# Patient Record
Sex: Female | Born: 1940 | Race: White | Hispanic: No | Marital: Single | Smoking: Never smoker
Health system: Southern US, Community
[De-identification: ages and names within clinical notes are randomized; demographics above are authoritative.]

## PROBLEM LIST (undated history)

## (undated) DIAGNOSIS — N951 Menopausal and female climacteric states: Secondary | ICD-10-CM

## (undated) DIAGNOSIS — I517 Cardiomegaly: Secondary | ICD-10-CM

## (undated) DIAGNOSIS — T7840XA Allergy, unspecified, initial encounter: Secondary | ICD-10-CM

## (undated) DIAGNOSIS — M1710 Unilateral primary osteoarthritis, unspecified knee: Secondary | ICD-10-CM

## (undated) DIAGNOSIS — I472 Ventricular tachycardia, unspecified: Secondary | ICD-10-CM

## (undated) DIAGNOSIS — I429 Cardiomyopathy, unspecified: Secondary | ICD-10-CM

## (undated) DIAGNOSIS — I2510 Atherosclerotic heart disease of native coronary artery without angina pectoris: Secondary | ICD-10-CM

## (undated) DIAGNOSIS — E669 Obesity, unspecified: Secondary | ICD-10-CM

## (undated) DIAGNOSIS — R0600 Dyspnea, unspecified: Secondary | ICD-10-CM

## (undated) DIAGNOSIS — I10 Essential (primary) hypertension: Secondary | ICD-10-CM

## (undated) DIAGNOSIS — I4729 Other ventricular tachycardia: Secondary | ICD-10-CM

## (undated) DIAGNOSIS — R7303 Prediabetes: Secondary | ICD-10-CM

## (undated) DIAGNOSIS — E079 Disorder of thyroid, unspecified: Secondary | ICD-10-CM

## (undated) DIAGNOSIS — M1990 Unspecified osteoarthritis, unspecified site: Secondary | ICD-10-CM

## (undated) HISTORY — DX: Atherosclerotic heart disease of native coronary artery without angina pectoris: I25.10

## (undated) HISTORY — DX: Cardiomegaly: I51.7

## (undated) HISTORY — DX: Allergy, unspecified, initial encounter: T78.40XA

## (undated) HISTORY — DX: Obesity, unspecified: E66.9

## (undated) HISTORY — DX: Unspecified osteoarthritis, unspecified site: M19.90

## (undated) HISTORY — DX: Other ventricular tachycardia: I47.29

## (undated) HISTORY — PX: OTHER SURGICAL HISTORY: SHX169

## (undated) HISTORY — DX: Unilateral primary osteoarthritis, unspecified knee: M17.10

## (undated) HISTORY — DX: Ventricular tachycardia, unspecified: I47.20

## (undated) HISTORY — DX: Disorder of thyroid, unspecified: E07.9

## (undated) HISTORY — DX: Cardiomyopathy, unspecified: I42.9

## (undated) HISTORY — DX: Essential (primary) hypertension: I10

## (undated) HISTORY — DX: Menopausal and female climacteric states: N95.1

## (undated) HISTORY — DX: Ventricular tachycardia: I47.2

---

## 1998-06-15 ENCOUNTER — Encounter: Admission: RE | Admit: 1998-06-15 | Discharge: 1998-06-15 | Payer: Self-pay | Admitting: Family Medicine

## 1999-06-11 ENCOUNTER — Encounter: Admission: RE | Admit: 1999-06-11 | Discharge: 1999-06-11 | Payer: Self-pay | Admitting: Sports Medicine

## 1999-07-01 ENCOUNTER — Encounter: Admission: RE | Admit: 1999-07-01 | Discharge: 1999-07-01 | Payer: Self-pay | Admitting: Family Medicine

## 1999-07-01 ENCOUNTER — Ambulatory Visit (HOSPITAL_COMMUNITY): Admission: RE | Admit: 1999-07-01 | Discharge: 1999-07-01 | Payer: Self-pay | Admitting: Sports Medicine

## 1999-10-29 ENCOUNTER — Encounter: Admission: RE | Admit: 1999-10-29 | Discharge: 1999-10-29 | Payer: Self-pay | Admitting: Sports Medicine

## 1999-10-29 ENCOUNTER — Encounter: Payer: Self-pay | Admitting: Sports Medicine

## 2000-04-10 ENCOUNTER — Encounter: Admission: RE | Admit: 2000-04-10 | Discharge: 2000-04-10 | Payer: Self-pay | Admitting: Family Medicine

## 2000-04-12 ENCOUNTER — Encounter: Admission: RE | Admit: 2000-04-12 | Discharge: 2000-04-12 | Payer: Self-pay | Admitting: Family Medicine

## 2000-04-19 ENCOUNTER — Encounter: Admission: RE | Admit: 2000-04-19 | Discharge: 2000-04-19 | Payer: Self-pay | Admitting: Family Medicine

## 2000-04-21 ENCOUNTER — Encounter: Admission: RE | Admit: 2000-04-21 | Discharge: 2000-04-21 | Payer: Self-pay | Admitting: Family Medicine

## 2000-05-19 ENCOUNTER — Encounter: Admission: RE | Admit: 2000-05-19 | Discharge: 2000-05-19 | Payer: Self-pay | Admitting: Family Medicine

## 2000-06-22 ENCOUNTER — Encounter: Admission: RE | Admit: 2000-06-22 | Discharge: 2000-06-22 | Payer: Self-pay | Admitting: Family Medicine

## 2000-12-29 ENCOUNTER — Encounter: Payer: Self-pay | Admitting: Sports Medicine

## 2000-12-29 ENCOUNTER — Encounter: Admission: RE | Admit: 2000-12-29 | Discharge: 2000-12-29 | Payer: Self-pay | Admitting: Sports Medicine

## 2001-02-19 ENCOUNTER — Encounter: Admission: RE | Admit: 2001-02-19 | Discharge: 2001-02-19 | Payer: Self-pay | Admitting: Family Medicine

## 2001-06-21 ENCOUNTER — Encounter: Admission: RE | Admit: 2001-06-21 | Discharge: 2001-06-21 | Payer: Self-pay | Admitting: Sports Medicine

## 2001-07-27 ENCOUNTER — Encounter: Admission: RE | Admit: 2001-07-27 | Discharge: 2001-07-27 | Payer: Self-pay | Admitting: Family Medicine

## 2001-08-03 ENCOUNTER — Encounter: Admission: RE | Admit: 2001-08-03 | Discharge: 2001-08-03 | Payer: Self-pay | Admitting: Family Medicine

## 2001-08-31 ENCOUNTER — Ambulatory Visit (HOSPITAL_COMMUNITY): Admission: RE | Admit: 2001-08-31 | Discharge: 2001-08-31 | Payer: Self-pay | Admitting: Sports Medicine

## 2002-04-05 ENCOUNTER — Encounter: Admission: RE | Admit: 2002-04-05 | Discharge: 2002-04-05 | Payer: Self-pay | Admitting: Family Medicine

## 2002-05-23 ENCOUNTER — Encounter: Admission: RE | Admit: 2002-05-23 | Discharge: 2002-05-23 | Payer: Self-pay | Admitting: Sports Medicine

## 2002-05-28 ENCOUNTER — Encounter: Admission: RE | Admit: 2002-05-28 | Discharge: 2002-05-28 | Payer: Self-pay | Admitting: Family Medicine

## 2002-06-24 ENCOUNTER — Encounter: Admission: RE | Admit: 2002-06-24 | Discharge: 2002-06-24 | Payer: Self-pay | Admitting: Family Medicine

## 2002-07-30 ENCOUNTER — Encounter: Admission: RE | Admit: 2002-07-30 | Discharge: 2002-07-30 | Payer: Self-pay | Admitting: Family Medicine

## 2002-10-18 ENCOUNTER — Encounter: Admission: RE | Admit: 2002-10-18 | Discharge: 2002-10-18 | Payer: Self-pay | Admitting: Family Medicine

## 2003-03-13 ENCOUNTER — Encounter: Admission: RE | Admit: 2003-03-13 | Discharge: 2003-03-13 | Payer: Self-pay | Admitting: Family Medicine

## 2003-03-20 ENCOUNTER — Encounter: Admission: RE | Admit: 2003-03-20 | Discharge: 2003-03-20 | Payer: Self-pay | Admitting: Sports Medicine

## 2003-06-26 ENCOUNTER — Encounter: Payer: Self-pay | Admitting: Sports Medicine

## 2003-06-26 ENCOUNTER — Encounter: Admission: RE | Admit: 2003-06-26 | Discharge: 2003-06-26 | Payer: Self-pay | Admitting: Sports Medicine

## 2003-08-21 ENCOUNTER — Encounter: Admission: RE | Admit: 2003-08-21 | Discharge: 2003-08-21 | Payer: Self-pay | Admitting: Family Medicine

## 2004-03-18 ENCOUNTER — Encounter: Admission: RE | Admit: 2004-03-18 | Discharge: 2004-03-18 | Payer: Self-pay | Admitting: Sports Medicine

## 2004-03-19 ENCOUNTER — Encounter: Admission: RE | Admit: 2004-03-19 | Discharge: 2004-03-19 | Payer: Self-pay | Admitting: Sports Medicine

## 2004-03-26 ENCOUNTER — Encounter: Admission: RE | Admit: 2004-03-26 | Discharge: 2004-03-26 | Payer: Self-pay | Admitting: Sports Medicine

## 2004-06-24 ENCOUNTER — Ambulatory Visit: Payer: Self-pay | Admitting: Sports Medicine

## 2004-08-13 ENCOUNTER — Ambulatory Visit: Payer: Self-pay | Admitting: Family Medicine

## 2004-10-14 ENCOUNTER — Ambulatory Visit: Payer: Self-pay | Admitting: Family Medicine

## 2005-01-21 ENCOUNTER — Ambulatory Visit: Payer: Self-pay | Admitting: Family Medicine

## 2005-06-02 ENCOUNTER — Encounter: Payer: Self-pay | Admitting: Sports Medicine

## 2005-06-02 ENCOUNTER — Ambulatory Visit: Payer: Self-pay | Admitting: Sports Medicine

## 2005-06-02 ENCOUNTER — Encounter: Admission: RE | Admit: 2005-06-02 | Discharge: 2005-06-02 | Payer: Self-pay | Admitting: Sports Medicine

## 2005-06-15 ENCOUNTER — Encounter (INDEPENDENT_AMBULATORY_CARE_PROVIDER_SITE_OTHER): Payer: Self-pay | Admitting: *Deleted

## 2005-09-12 ENCOUNTER — Ambulatory Visit: Payer: Self-pay | Admitting: Oncology

## 2005-09-30 ENCOUNTER — Ambulatory Visit (HOSPITAL_COMMUNITY): Admission: RE | Admit: 2005-09-30 | Discharge: 2005-09-30 | Payer: Self-pay | Admitting: Oncology

## 2005-10-07 ENCOUNTER — Ambulatory Visit (HOSPITAL_COMMUNITY): Admission: RE | Admit: 2005-10-07 | Discharge: 2005-10-07 | Payer: Self-pay | Admitting: Gastroenterology

## 2006-01-27 ENCOUNTER — Ambulatory Visit: Payer: Self-pay | Admitting: Oncology

## 2006-03-03 ENCOUNTER — Ambulatory Visit: Payer: Self-pay | Admitting: Oncology

## 2006-03-03 LAB — COMPREHENSIVE METABOLIC PANEL
Albumin: 4.4 g/dL (ref 3.5–5.2)
BUN: 19 mg/dL (ref 6–23)
CO2: 26 mEq/L (ref 19–32)
Chloride: 101 mEq/L (ref 96–112)
Potassium: 4.3 mEq/L (ref 3.5–5.3)

## 2006-03-03 LAB — CBC WITH DIFFERENTIAL/PLATELET
BASO%: 0.5 % (ref 0.0–2.0)
Basophils Absolute: 0 10*3/uL (ref 0.0–0.1)
EOS%: 4 % (ref 0.0–7.0)
Eosinophils Absolute: 0.2 10*3/uL (ref 0.0–0.5)
MCHC: 34.4 g/dL (ref 32.0–36.0)
MONO#: 0.5 10*3/uL (ref 0.1–0.9)
NEUT#: 2.7 10*3/uL (ref 1.5–6.5)
NEUT%: 53.8 % (ref 39.6–76.8)
Platelets: 210 10*3/uL (ref 145–400)
WBC: 5 10*3/uL (ref 3.9–10.0)

## 2006-03-10 ENCOUNTER — Ambulatory Visit (HOSPITAL_COMMUNITY): Admission: RE | Admit: 2006-03-10 | Discharge: 2006-03-10 | Payer: Self-pay | Admitting: Oncology

## 2006-03-24 ENCOUNTER — Ambulatory Visit: Payer: Self-pay | Admitting: Oncology

## 2006-03-27 LAB — CBC WITH DIFFERENTIAL (CANCER CENTER ONLY)
BASO#: 0 10*3/uL (ref 0.0–0.2)
HCT: 42.3 % (ref 34.8–46.6)
HGB: 14.1 g/dL (ref 11.6–15.9)
LYMPH#: 1.9 10*3/uL (ref 0.9–3.3)
MCH: 32.1 pg (ref 26.0–34.0)
MONO%: 7.7 % (ref 0.0–13.0)
Platelets: 203 10*3/uL (ref 145–400)
RDW: 11.2 % (ref 10.5–14.6)
WBC: 5.4 10*3/uL (ref 3.9–10.0)

## 2006-03-27 LAB — COMPREHENSIVE METABOLIC PANEL
AST: 31 U/L (ref 0–37)
Albumin: 4.6 g/dL (ref 3.5–5.2)
Glucose, Bld: 74 mg/dL (ref 70–99)
Sodium: 142 mEq/L (ref 135–145)
Total Protein: 7.9 g/dL (ref 6.0–8.3)

## 2006-06-22 ENCOUNTER — Ambulatory Visit: Payer: Self-pay | Admitting: Sports Medicine

## 2006-10-12 DIAGNOSIS — I517 Cardiomegaly: Secondary | ICD-10-CM | POA: Insufficient documentation

## 2006-10-12 DIAGNOSIS — IMO0002 Reserved for concepts with insufficient information to code with codable children: Secondary | ICD-10-CM | POA: Insufficient documentation

## 2006-10-12 DIAGNOSIS — I10 Essential (primary) hypertension: Secondary | ICD-10-CM | POA: Insufficient documentation

## 2006-10-12 DIAGNOSIS — N951 Menopausal and female climacteric states: Secondary | ICD-10-CM | POA: Insufficient documentation

## 2006-10-12 DIAGNOSIS — J309 Allergic rhinitis, unspecified: Secondary | ICD-10-CM | POA: Insufficient documentation

## 2006-10-12 DIAGNOSIS — M1710 Unilateral primary osteoarthritis, unspecified knee: Secondary | ICD-10-CM

## 2006-10-12 DIAGNOSIS — M5382 Other specified dorsopathies, cervical region: Secondary | ICD-10-CM | POA: Insufficient documentation

## 2006-10-12 DIAGNOSIS — E669 Obesity, unspecified: Secondary | ICD-10-CM | POA: Insufficient documentation

## 2006-10-13 ENCOUNTER — Encounter (INDEPENDENT_AMBULATORY_CARE_PROVIDER_SITE_OTHER): Payer: Self-pay | Admitting: *Deleted

## 2006-11-17 ENCOUNTER — Encounter: Payer: Self-pay | Admitting: Sports Medicine

## 2007-06-17 ENCOUNTER — Ambulatory Visit: Payer: Self-pay | Admitting: Cardiology

## 2007-06-17 ENCOUNTER — Inpatient Hospital Stay (HOSPITAL_COMMUNITY): Admission: EM | Admit: 2007-06-17 | Discharge: 2007-06-20 | Payer: Self-pay | Admitting: Emergency Medicine

## 2007-06-18 ENCOUNTER — Encounter: Payer: Self-pay | Admitting: Cardiology

## 2007-06-20 ENCOUNTER — Encounter: Payer: Self-pay | Admitting: Sports Medicine

## 2007-07-17 ENCOUNTER — Telehealth: Payer: Self-pay | Admitting: Sports Medicine

## 2007-07-18 ENCOUNTER — Ambulatory Visit: Payer: Self-pay | Admitting: Internal Medicine

## 2007-07-18 LAB — CONVERTED CEMR LAB
ALT: 33 units/L (ref 0–35)
AST: 28 units/L (ref 0–37)
Alkaline Phosphatase: 74 units/L (ref 39–117)
Bilirubin, Direct: 0.2 mg/dL (ref 0.0–0.3)

## 2007-07-19 ENCOUNTER — Ambulatory Visit: Payer: Self-pay | Admitting: Internal Medicine

## 2007-08-27 ENCOUNTER — Ambulatory Visit: Payer: Self-pay | Admitting: Internal Medicine

## 2007-10-19 ENCOUNTER — Ambulatory Visit: Payer: Self-pay | Admitting: Internal Medicine

## 2007-11-02 ENCOUNTER — Ambulatory Visit: Payer: Self-pay

## 2007-11-02 ENCOUNTER — Encounter: Payer: Self-pay | Admitting: Internal Medicine

## 2008-03-21 ENCOUNTER — Ambulatory Visit: Payer: Self-pay | Admitting: Internal Medicine

## 2008-09-18 ENCOUNTER — Ambulatory Visit: Payer: Self-pay | Admitting: Internal Medicine

## 2008-12-10 ENCOUNTER — Telehealth: Payer: Self-pay | Admitting: Internal Medicine

## 2008-12-22 ENCOUNTER — Telehealth: Payer: Self-pay | Admitting: Internal Medicine

## 2008-12-24 ENCOUNTER — Telehealth (INDEPENDENT_AMBULATORY_CARE_PROVIDER_SITE_OTHER): Payer: Self-pay | Admitting: *Deleted

## 2009-03-06 ENCOUNTER — Telehealth: Payer: Self-pay | Admitting: Internal Medicine

## 2009-08-21 ENCOUNTER — Ambulatory Visit: Payer: Self-pay | Admitting: Internal Medicine

## 2009-08-21 DIAGNOSIS — I472 Ventricular tachycardia, unspecified: Secondary | ICD-10-CM | POA: Insufficient documentation

## 2009-08-27 LAB — CONVERTED CEMR LAB
ALT: 36 units/L — ABNORMAL HIGH (ref 0–35)
Albumin: 4.3 g/dL (ref 3.5–5.2)
CO2: 29 meq/L (ref 19–32)
Calcium: 9.8 mg/dL (ref 8.4–10.5)
Creatinine, Ser: 1.1 mg/dL (ref 0.4–1.2)
GFR calc non Af Amer: 52.66 mL/min (ref >60)
Glucose, Bld: 76 mg/dL (ref 70–99)
Potassium: 3.8 meq/L (ref 3.5–5.1)
Total Bilirubin: 0.8 mg/dL (ref 0.3–1.2)

## 2009-10-21 ENCOUNTER — Telehealth (INDEPENDENT_AMBULATORY_CARE_PROVIDER_SITE_OTHER): Payer: Self-pay | Admitting: *Deleted

## 2010-02-10 ENCOUNTER — Ambulatory Visit: Payer: Self-pay | Admitting: Internal Medicine

## 2010-09-05 ENCOUNTER — Encounter: Payer: Self-pay | Admitting: Sports Medicine

## 2010-09-13 ENCOUNTER — Encounter: Payer: Self-pay | Admitting: Internal Medicine

## 2010-09-13 ENCOUNTER — Ambulatory Visit
Admission: RE | Admit: 2010-09-13 | Discharge: 2010-09-13 | Payer: Self-pay | Source: Home / Self Care | Attending: Internal Medicine | Admitting: Internal Medicine

## 2010-09-16 NOTE — Assessment & Plan Note (Signed)
Summary: rov per pt call/wants to discuss meds/lg   Primary Provider:  Dr. Concha Se urgent care  CC:  rov. pt wants to discuss medications. pt had an EKG at [facility] last week.  Marland Kitchen  History of Present Illness: Carmen Brown returns today for followup.  She is a pleasant 70 yo woman with a h/o repetitive monomophic VT initially diagnosed several yrs ago.  She was placed on amiodarone and has done well.  Her LV function was initially down but is now improved and she is off of her CHF meds.  We have gradually decreased her dosage of amiodarone and she is now taking a gram a week.  She has developed some thyroid dysfunction and was placed on thyroid replacement.  She did not tolerate the synthroid that I prescribed but has tolerated levothyroxine just fine.  She denies c/p or sob.  No syncope.  Current Medications (verified): 1)  Amiodarone Hcl 200 Mg Tabs (Amiodarone Hcl) .... Take 1/2  Tablet By Mouth Once A Day 5 Days A Week 2)  Furosemide 20 Mg Tabs (Furosemide) .... Take 1 Tablet By Mouth Once A Day 3)  Lisinopril 10 Mg Tabs (Lisinopril) .... Take One Tablet By Mouth Daily 4)  Potassium Chloride Cr 10 Meq Cr-Tabs (Potassium Chloride) .... Take 1 Tablet By Mouth Once A Day 5)  Multivitamins   Tabs (Multiple Vitamin) .... Take One Tablet By Mouth Twice Daily. 6)  Glucosamine Sulfate   Powd (Glucosamine Sulfate) .... Take One Tablet By Mouth Twice Daily. 7)  Levothyroxine Sodium 25 Mcg Tabs (Levothyroxine Sodium) .... Take One Tablet Two Times A Day 8)  Vitamin D3 2000 Unit Caps (Cholecalciferol) .... Take One Capsule Daily 9)  Aspirin 81 Mg Tabs (Aspirin) .... 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    Vital Signs:  Patient profile:   70 year old female Height:      66 inches Weight:      180 pounds BMI:     29.16 Pulse rate:   84 / minute Pulse rhythm:   regular BP sitting:   133 / 94  (left arm) Cuff size:   regular  Vitals Entered By: Judithe Modest CMA (February 10, 2010 3:46 PM)  Physical Exam  General:  Well developed, well nourished, in no acute distress.  HEENT: normal Neck: supple. No JVD. Carotids 2+ bilaterally no bruits Cor: RRR no rubs or murmurs.  A soft S4 is present. Lungs: CTA Ab: soft, nontender. nondistended. No HSM. Good bowel sounds Ext: warm. no cyanosis, clubbing or edema Neuro: alert and oriented. Grossly nonfocal. 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  We discussed two treatment options today.  The first was to continue her low dose amiodarone.  The second was to  stop amiodarone and see how she does.  She would like to stop amiodarone.  She may ultimately not require thyroid replacement.  I have asked her to call me and let me know if her symptoms of palpitations return and we would of course restart amiodarone. Her updated medication list for this problem includes:    Amiodarone Hcl 200 Mg Tabs (Amiodarone hcl) .Marland Kitchen... Take 1/2  tablet by mouth once a day 5 days a week    Lisinopril 10 Mg Tabs (Lisinopril) .Marland Kitchen... Take one tablet by mouth daily    Aspirin 81 Mg Tabs (Aspirin) ..... Once daily  Her updated medication list for this problem includes:     Amiodarone Hcl 200 Mg Tabs (Amiodarone hcl) .Marland Kitchen... Take 1/2  tablet by mouth once a day 5 days a week    Lisinopril 10 Mg Tabs (Lisinopril) .Marland Kitchen... Take one tablet by mouth daily    Aspirin 81 Mg Tabs (Aspirin) ..... Once daily  Problem # 2:  HYPERTENSION, BENIGN SYSTEMIC (ICD-401.1)  Her blood pressure has been fairly well controlled.  In addition to her meds below, she is instructed to maintain a low sodium diet. Her updated medication list for this problem includes:    Furosemide 20 Mg Tabs (Furosemide) .Marland Kitchen... Take 1 tablet by mouth once a day    Lisinopril 10 Mg Tabs (Lisinopril) .Marland Kitchen... Take one tablet by mouth daily    Aspirin 81 Mg Tabs (Aspirin) ..... Once daily  Her updated medication list for this problem includes:    Furosemide 20 Mg Tabs (Furosemide) .Marland Kitchen... Take 1 tablet by mouth once a day    Lisinopril 10 Mg Tabs (Lisinopril) .Marland Kitchen... Take one tablet by mouth daily    Aspirin 81 Mg Tabs (Aspirin) ..... Once daily  Patient Instructions: 1)  Your physician recommends that you schedule a follow-up appointment as needed

## 2010-09-16 NOTE — Assessment & Plan Note (Signed)
Summary: PER CHECK OUT/SF   Visit Type:  Follow-up Primary Provider:  Paula Compton MD   History of Present Illness: Carmen Brown returns today for followup.  She is a very pleasant 70 year old woman with a history of repetitive monomorphic VT which has been quiescent over the last year on low-dose amiodarone.  The patient has had a history of heart failure, but this is resolving.  Her LV function is actually normalized.  When I saw her last a year ago, she was having problems with shoulder pain and she went to an acupuncturist and her shoulder feels better.  She has had no symptomatic arrhythmias.  She notes that she has been under increased stress and her blood pressure is up.  No c/p or sob.  Current Medications (verified): 1)  Amiodarone Hcl 200 Mg Tabs (Amiodarone Hcl) .... Take 1/2  Tablet By Mouth Once A Day 2)  Furosemide 20 Mg Tabs (Furosemide) .... Take 1 Tablet By Mouth Once A Day 3)  Lisinopril 10 Mg Tabs (Lisinopril) .... Take One Tablet By Mouth Daily 4)  Potassium Chloride Cr 10 Meq Cr-Tabs (Potassium Chloride) .... Take 1 Tablet By Mouth Once A Day 5)  Flunisolide 29 Mcg/act Soln (Flunisolide) .... Spray 1 Puff Into Both Nostrils Once A Day 6)  Multivitamins   Tabs (Multiple Vitamin) .... Take One Tablet By Mouth Twice Daily. 7)  Glucosamine Sulfate   Powd (Glucosamine Sulfate) .... Take One Tablet By Mouth Twice Daily.  Allergies: 1)  * Certain Foods 2)  * Pollens,mold Mildew  Past History:  Past Medical History: Last updated: 10/12/2006 allergy testing and shots for years, cervical polyp - recurrent in 2002  Past Surgical History: Last updated: 10/12/2006 Colonoscopy - 06/22/2006, cuatery of cervical polyp -, ECHO - mild LVH - 07/17/2006  Review of Systems  The patient denies chest pain, syncope, dyspnea on exertion, and peripheral edema.    Vital Signs:  Patient profile:   70 year old female Height:      66 inches Weight:      177 pounds BMI:     28.67 Pulse  rate:   81 / minute BP sitting:   150 / 100  (left arm)  Vitals Entered By: Laurance Flatten CMA (August 21, 2009 12:32 PM)  Physical Exam  General:  Well developed, well nourished, in no acute distress.  HEENT: normal Neck: supple. No JVD. Carotids 2+ bilaterally no bruits Cor: RRR no rubs or murmurs.  A soft S4 is present. Lungs: CTA Ab: soft, nontender. nondistended. No HSM. Good bowel sounds Ext: warm. no cyanosis, clubbing or edema Neuro: alert and oriented. Grossly nonfocal. affect pleasant    EKG  Procedure date:  08/21/2009  Findings:      Normal sinus rhythm with rate of:  81.  Poor R wave progression.  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  I have asked that she stop amiodarone on sat. and sunday and will check labs today. The following medications were removed from the medication list:    Carvedilol 6.25 Mg Tabs (Carvedilol) .Marland Kitchen... Take 1 tablet by mouth twice a day    Lisinopril 20 Mg Tabs (Lisinopril) .Marland Kitchen... Take 1 tablet by mouth every morning Her updated medication list for this problem includes:    Amiodarone Hcl 200 Mg Tabs (Amiodarone hcl) .Marland Kitchen... Take 1/2  tablet by mouth once a day    Lisinopril 10 Mg Tabs (  Lisinopril) .Marland Kitchen... Take one tablet by mouth daily  Problem # 2:  HYPERTENSION, BENIGN SYSTEMIC (ICD-401.1) Her blood pressure has not been optimally controlled.  I will ask her to reduce her sodium intake.  She may require more lisinopril. The following medications were removed from the medication list:    Carvedilol 6.25 Mg Tabs (Carvedilol) .Marland Kitchen... Take 1 tablet by mouth twice a day    Lisinopril 20 Mg Tabs (Lisinopril) .Marland Kitchen... Take 1 tablet by mouth every morning Her updated medication list for this problem includes:    Furosemide 20 Mg Tabs (Furosemide) .Marland Kitchen... Take 1 tablet by mouth once a day    Lisinopril 10 Mg Tabs (Lisinopril) .Marland Kitchen... Take one tablet  by mouth daily  Other Orders: EKG w/ Interpretation (93000) TLB-BMP (Basic Metabolic Panel-BMET) (80048-METABOL) TLB-TSH (Thyroid Stimulating Hormone) (84443-TSH) TLB-Hepatic/Liver Function Pnl (80076-HEPATIC)  Patient Instructions: 1)  Your physician recommends that you schedule a follow-up appointment in: 1 year with Dr. [name] 2)  Your physician recommends that you return for lab work today. 3)  Your physician has recommended you make the following change in your medication: do not take the amiodarone on the weekends until further notice.  Prevention & Chronic Care Immunizations   Influenza vaccine: Not documented    Tetanus booster: Not documented    Pneumococcal vaccine: Not documented    H. zoster vaccine: Not documented  Colorectal Screening   Hemoccult: Done.  (06/16/2003)    Colonoscopy: Done.  (06/15/2006)  Other Screening   Pap smear: Done.  (06/15/2005)    Mammogram: Done.  (06/15/2006)    DXA bone density scan: Not documented   Smoking status: Not documented  Lipids   Total Cholesterol: Not documented   LDL: Not documented   LDL Direct: Not documented   HDL: Not documented   Triglycerides: Not documented  Hypertension   Last Blood Pressure: 150 / 100  (08/21/2009)   Serum creatinine: Not documented   Serum potassium Not documented  Self-Management Support :    Hypertension self-management support: Not documented

## 2010-09-16 NOTE — Miscellaneous (Signed)
Summary: Orders Update  Clinical Lists Changes  Orders: Added new Test order of TLB-BMP (Basic Metabolic Panel-BMET) (80048-METABOL) - Signed Added new Test order of TLB-TSH (Thyroid Stimulating Hormone) (84443-TSH) - Signed Added new Test order of TLB-Hepatic/Liver Function Pnl (80076-HEPATIC) - Signed

## 2010-09-16 NOTE — Progress Notes (Signed)
Summary: Calling backk about medication  Phone Note Call from Patient Call back at [facility] Phone [phone]   Caller: Patient Summary of Call: Pt returning call about her medication Initial call taken by: Judie Grieve,  October 21, 2009 3:33 PM  Follow-up for Phone Call        LM for pt to call back. Follow-up by: Laurance Flatten CMA,  October 22, 2009 4:39 PM  Additional Follow-up for Phone Call Additional follow up Details #1::        Pt was told that she will need to have this filled by a PCP. Additional Follow-up by: Laurance Flatten CMA,  October 23, 2009 3:38 PM

## 2010-09-22 NOTE — Assessment & Plan Note (Signed)
Summary: 1 YR   Visit Type:  1 year follow up Primary Provider:  Dr. Concha Se urgent care  CC:  No complaints.  History of Present Illness: [name] returns today for followup.  She is a pleasant 70 yo woman with a h/o repetitive monomorphic VT who was initially placed on amiodarone who has done well with regard to arrhythmia suppression. The patient subsequently stopped her low dose amiodarone several months ago and returns for followup. She also has HTN and normalized LV function (previously 45%) without CHF symptoms. In the interim she has done well and denies palpitations or syncope off of amiodarone.  Current Medications (verified): 1)  Furosemide 20 Mg Tabs (Furosemide) .... Take 1 Tablet By Mouth Once A Day 2)  Lisinopril 10 Mg Tabs (Lisinopril) .... Take One Tablet By Mouth Daily 3)  Potassium Chloride Cr 10 Meq Cr-Tabs (Potassium Chloride) .... Take 1 Tablet By Mouth Once A Day 4)  Multivitamins   Tabs (Multiple Vitamin) .... Take One Tablet By Mouth Twice Daily. 5)  Glucosamine Chondroitin Complx  Caps (Glucosamine-Chondroit-Vit C-Mn) .... Take 1 Capsule By Mouth Two Times A Day 6)  Vitamin D3 2000 Unit Caps (Cholecalciferol) .... Take One Capsule Daily 7)  Aspirin 81 Mg Tabs (Aspirin) .... Once Daily 8)  Miscadine Grape Seed Capsule .... Take 1 Capsule By Mouth Two Times A Day 9)  Thyro Plus .... Take 1 Capsule By Mouth Once A Day  Allergies: 1)  ! Epinephrine 2)  * Certain Foods 3)  * Pollens,mold Mildew  Past History:  Past Medical History: Last updated: 10/12/2006 allergy testing and shots for years, cervical polyp - recurrent in 2002  Past Surgical History: Last updated: 10/12/2006 Colonoscopy - 06/22/2006, cuatery of cervical polyp -, ECHO - mild LVH - 07/17/2006  Review of Systems  The patient denies chest pain, syncope, dyspnea on exertion, and peripheral edema.    Vital Signs:  Patient profile:   70 year old female Height:      66 inches Weight:       173 pounds BMI:     28.02 Pulse rate:   73 / minute Pulse rhythm:   regular Resp:     18 per minute BP sitting:   147 / 97  (left arm) Cuff size:   large  Vitals Entered By: Vikki Ports (September 13, 2010 10:36 AM)  Physical Exam  General:  Well developed, well nourished, in no acute distress.  HEENT: normal Neck: supple. No JVD. Carotids 2+ bilaterally no bruits Cor: RRR no rubs or murmurs.  A soft S4 is present. Lungs: CTA Ab: soft, nontender. nondistended. No HSM. Good bowel sounds Ext: warm. no cyanosis, clubbing or edema Neuro: alert and oriented. Grossly nonfocal. affect pleasant    EKG  Procedure date:  09/13/2010  Findings:      Normal sinus rhythm with rate of:  73.  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 I have recommended a period of watchful waiting. I will see her back as needed. The following medications were removed from the medication list:    Amiodarone Hcl 200 Mg Tabs (Amiodarone hcl) .Marland Kitchen... Take 1/2  tablet by mouth once a day 5 days a week Her updated medication list for this problem includes:    Lisinopril 10 Mg Tabs (Lisinopril) .Marland Kitchen... Take one tablet by mouth daily    Aspirin 81 Mg Tabs (Aspirin) ..... Once daily  The following medications were removed from the medication list:    Amiodarone Hcl 200 Mg Tabs (Amiodarone hcl) .Marland Kitchen... Take 1/2  tablet by mouth once a day 5 days a week Her updated medication list for this problem includes:    Lisinopril 10 Mg Tabs (Lisinopril) .Marland Kitchen... Take one tablet by mouth daily    Aspirin 81 Mg Tabs (Aspirin) ..... Once daily  Problem # 2:  HYPERTENSION, BENIGN SYSTEMIC (ICD-401.1) her blood pressure is elevated today but she has not taken her medications. She would like to stop her diuretic. I have asked her to follow up with Dr. [name]. She will stop the lasix but may need something else to control her blood  pressure. She will stop potassium as well. Her updated medication list for this problem includes:    Furosemide 20 Mg Tabs (Furosemide) .Marland Kitchen... Take 1 tablet by mouth once a day    Lisinopril 10 Mg Tabs (Lisinopril) .Marland Kitchen... Take one tablet by mouth daily    Aspirin 81 Mg Tabs (Aspirin) ..... Once daily  Other Orders: EKG w/ Interpretation (93000)  Patient Instructions: 1)  Your physician recommends that you schedule a follow-up appointment in: AS NEEDED 2)  Your physician has recommended you make the following change in your medication: STOP POTASSIUM AND FUROSEMIDE

## 2010-10-18 ENCOUNTER — Encounter: Payer: Self-pay | Admitting: Home Health Services

## 2010-12-28 NOTE — H&P (Signed)
Carmen Brown, Carmen Brown               ACCOUNT NO.:  [id number]   MEDICAL RECORD NO.:  [id number]          PATIENT TYPE:  INP   LOCATION:  1828                         FACILITY:  [facility]   PHYSICIAN:  Learta Codding, MD,FACC DATE OF BIRTH:  1941-07-22   DATE OF ADMISSION:  06/17/2007  DATE OF DISCHARGE:                              HISTORY & PHYSICAL   REASON FOR ADMISSION:  Repetitive monomorphic ventricular tachycardia.   HISTORY OF PRESENT ILLNESS:  The patient is a 70 year old female with a  prior history of dyspnea and hypertension.  She has a history of  cholelithiasis and recurrent episodes of shortness of breath, which has  been going for several months.  Reportedly, the patient has seen Dr.  Peter Swaziland.  As a matter-of-fact, he has seen Dr. Swaziland for the last  2-3 years.  She has been evaluated and treated for hypertension.  She  has been off and on noncompliant with her medical regimen.  She states  when she was put on a triple drug regimen for hypertension, that she  felt extremely weak with episodes of dizziness and pallor.  The patient  states that more recently she has been taking Benicar, although the last  week again she has not been taking this.  The patient is a flight  attendant and she tells me that her colleague sometimes notice that she  becomes very weak and has dizziness without definite near syncopal  episodes.  The patient denies any passing out spells.  She does state  that particularly in the last 3 weeks, she has become more short of  breath on minimal exertion.  She finds herself huffing and puffing  when she runs through the airport trying to catch her flight.  She also  reports to me that she, in the past, had an echocardiographic study  done, which was reportedly normal.   The patient, because of progressive dyspnea and cough, [facility]  An EKG there demonstrated a repetitive  monomorphic VT and Dr. [name] referred the  patient to the emergency room  because of her complaints of some chest tightness and shortness of  breath.  He was also concerned regarding possibility of myocardial  infarction.  On arrival in the emergency room, the patient has  repetitive monomorphic VT with a left bundle branch morphology and  inferior axis.  Sinus beats, however, do not demonstrate any ST  elevation.  The patient, in the field, was bolused with a dose of  amiodarone and received a second bolus of amiodarone in the emergency  room.  A bedside echocardiographic study demonstrates possibly mild LV  dysfunction with an ejection fraction of 35-40%, although it is  difficult to evaluate the ejection fraction due to recurrent and  intermittent ventricular tachycardia.  Clinically, however, the patient  does not appear to be in frank heart failure. Chest x-ray is currently  still pending.  The patient also tells me that over the last several  weeks she has been receiving liquids to try to purge her body and as  part of a preventive  health care strategy.  She also sees 2 different  holistic physicians.  She stopped using any type of medications and  liquids since Wednesday.  Apparently at that time this was associated  apparently with significant diarrhea.   ALLERGIES:  No known drug allergies.   MEDICATIONS:  __________  10 mL per day, detox cleansing liquid once a  day.   PAST MEDICAL HISTORY:  1. Hypertension.  2. Cholelithiasis.  3. Possible Cardiolite imaging study in 2003.   SOCIAL HISTORY:  Patient is a Financial controller.  She does not smoke.  She drinks alcohol occasionally.   FAMILY HISTORY:  Mother died in her 66s from a myocardial infarction and  father died from myocardial infarction and heart failure in his 31s.  No  siblings with premature coronary arteries.   REVIEW OF SYSTEMS:  No fever, chills, or sweats.  No headache or  bleeding.  No vision or hearing loss.  Positive for dyspnea on exertion  and  shortness of breath as outlined above.  Positive for cough and  dizziness and presyncope.  No frequency or dysuria.  Positive for  weakness.  No myalgias or arthralgias.  Diarrhea until Wednesday when  the patient was using a liquid detox.  Positive also for anorexia.  No  polyuria or polydyspnea.   PHYSICAL EXAMINATION:  VITAL SIGNS:  Blood pressure 175/97, heart rate  130 beats per minute with intermittent wide complex rhythm as outlined  above.  Respiratory rate 20.  GENERAL:  Well-nourished white female in no apparent distress.  HEENT:  PERLA.  EOMI.  Sclera clear.  NECK:  Supple.  Normal carotid upstrokes.  No carotid bruits.  LUNGS:  Clear breath sounds bilaterally.  HEART:  Irregularly irregular, but with no pathological murmurs.  SKIN:  No rash or lesions.  ABDOMEN:  Soft, nontender.  No rebound.  Good bowel sounds.  Abdomen  protuberant.  GU/RECTAL:  Deferred.  EXTREMITIES:  No cyanosis, clubbing or edema.  MUSCULOSKELETAL:  No abnormalities.  NEURO:  Patient alert and oriented, grossly unremarkable.   STUDIES:  Chest x-ray is pending.  EKG - normal sinus rhythm with left  bundle branch morphology and inferior axis consistent with right  ventricular outflow tract tachycardia.   LABORATORY DATA:  Creatinine 1.0, sodium 139, potassium 5.2, chloride  113, BUN 15, glucose 110, pH 7.45.  White count is 8.0, hemoglobin 14.2,  hematocrit 41.6, platelet count 201.   HOSPITAL COURSE:  1. Repetitive monomorphic ventricular tachycardia.      a.     Right ventricular outflow tract morphology.      b.     Rule out structural heart disease.  2. Dyspnea.      a.     Left ventricular dysfunction by portable echocardiogram.          I. Rule out underlying coronary artery disease (risk factor).   PLAN:  1. The patient has essentially asymptomatic repetitive monomorphic      ventricular tachycardia.  It is not clear; however, if her episodes      of dizziness and presyncope could not  be associated with her      tachycardia.  The patient will bet treated with beta blocker and      amiodarone due to the concern of using verapamil, given the fact      that the patient has bi carina LV dysfunction.  2. Patient's LV dysfunction that might be secondary to tachycardia-      induced RM; however,  longstanding history of dyspnea, somewhat      confirm.  We will discuss this further with Dr. Graciela Husbands.  We will try      to stabilize the patient's arrhythmia first with medical therapy.      Thereafter, we will review her.  A formal echocardiographic study      and if she has LV dysfunction, I would be inclined to proceed with      a diagnostic catheterization, as part of the complete workup.  3. At this point, the patient's electrolytes are within normal limits,      which is somewhat surprising given her frequent episodes of      diarrhea.  Will add a BNP level and if low, I do think the patient      could benefit from some intravenous fluids.  Cardiac enzymes will      be obtained to rule out some myocardial infarction.      Learta Codding, MD,FACC  Electronically Signed     GED/MEDQ  D:  06/17/2007  T:  06/17/2007  Job:  [id number]   cc:   Jonita Albee, M.D.

## 2010-12-28 NOTE — Assessment & Plan Note (Signed)
[facility]                         ELECTROPHYSIOLOGY OFFICE NOTE   NAME:Carmen Brown, Carmen Brown                      MRN:          [id number]  DATE:03/21/2008                            DOB:          September 25, 1940    Ms. Rauf returns today for followup.  She is very pleasant 70 year old  woman with a history of repetitive monomorphic VT who has done quite  nicely on amiodarone therapy.  Her LV function is normalized.  She  returns today for followup.  She denies palpitations, chest pain, or  shortness of breath.  She has had some problems with allergic rhinitis  type symptoms.   Her medicines include:  1. Potassium 10 mEq daily.  2. Aspirin 81 a day.  3. Amiodarone 200 a day.  4. Lisinopril 10 daily.  5. Lasix 10 mg daily.   PHYSICAL EXAMINATION:  GENERAL:  She is a pleasant well-appearing woman  in no distress.  VITAL SIGNS:  Blood pressure was 132/100, the pulse was 73 and regular,  the respirations were 18, and the weight was 179 pounds.  NECK:  No jugular venous distention.  LUNGS:  Clear bilaterally to auscultation.  No wheezes, rales, or  rhonchi were present.  CARDIAC:  Regular rate rhythm.  Normal S1 and S2.  EXTREMITIES:  Demonstrate no edema.   EKG demonstrates sinus rhythm with normal axis and intervals.   IMPRESSION:  1. Repetitive monomorphic ventricular tachycardia, now nicely      controlled on low-dose amiodarone.  With regard to this, we will      plan on decreasing her amiodarone dose by 200 mg weekly, so that      she will be on 200 mg 5 days a week and 100 mg 2 days a week, and      over each 2 month block decrease in her dose 100 mg per week.  2. Allergic rhinitis.  I have given her a prescription for Flonase      today.  3. Hypertension.  The patient has been intolerant of beta-blockers in      the past.  She will continue her lisinopril.  I have asked that she      maintain a low-sodium diet.  We will plan to see the patient  back      in the office in several months.     [name]. [name], MD  Electronically Signed    GWT/MedQ  DD: 03/21/2008  DT: 03/22/2008  Job #: [id number]

## 2010-12-28 NOTE — Assessment & Plan Note (Signed)
[facility]                         ELECTROPHYSIOLOGY OFFICE NOTE   NAME:Carmen Brown, Carmen Brown                      MRN:          [id number]  DATE:09/18/2008                            DOB:          [birth year]    Carmen Brown returns today for followup.  She is a very pleasant 70 year old  woman with a history of repetitive monomorphic VT which has been  quiescent over the last year on low-dose amiodarone.  The patient has  had a history of heart failure, but this is resolving.  Her LV function  is actually normalized.  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  Otherwise, no specific complaints  today.  She denies chest pain or shortness of breath.  She has no  peripheral edema.  She has no palpitations.   CURRENT MEDICATIONS:  1. Potassium 10 a day.  2. Aspirin 81 a day.  3. Amiodarone 200 mg half a tablet daily except for a whole tablet 1      day a week.  4. Lisinopril 10 a day.  5. Lasix 20 a day.   PHYSICAL EXAMINATION:  GENERAL:  She is a pleasant, well-appearing,  middle-aged woman who looks younger than her stated age.  VITAL SIGNS:  Blood pressure today was 108/82, the pulse 78 and regular,  respirations were 18, the weight was 171 pounds.  NECK:  No jugular venous distention.  LUNGS:  Clear bilaterally to auscultation.  No wheezes, rales, or  rhonchi are present and no increased work of breathing.  CARDIOVASCULAR:  Regular rate and rhythm.  Normal S1 and S2.  ABDOMEN:  Soft, nontender, nondistended.  EXTREMITIES:  Demonstrate no edema.  Exam of the right shoulder  demonstrated no point tenderness.  She had mostly full range of motion  in place.  There was some difficulty with abduction of her right arm.   EKG demonstrates sinus rhythm with poor R-wave  progression.   IMPRESSION:  1. Repetitive monomorphic ventricular tachycardia, now quiescent on      low-dose amiodarone.  2. Congestive heart failure, now resolved with resolution and      normalization of her LV function.  3. Multiple arthritic complaints.  4. Borderline hypertension.   DISCUSSION:  The patient is stable from arrhythmia perspective and from  heart failure perspective.  She will ultimately be down on 100 mg of  amiodarone daily and my expectation is that, we will keep her at this  dose for approximately 9 months.  At that time, I will see her back in  the office, and if she is stable, then I would ask that she stop her  amiodarone altogether.  To try to explain all this it may well have been  that she had subclinical mild carditis which has subsequently resolved  itself.  Because of the patient's multiple orthopedic complaints which  is her predominance of her symptoms, I am going to refer her and  because  she has no primary care physician, I am going to refer her to Dr.  Karleen Hampshire Copland in our [facility] office for additional followup and  primary care evaluation.     [name]. [name], MD  Electronically Signed    GWT/MedQ  DD: 09/18/2008  DT: 09/19/2008  Job #: [id number]   cc:   Juleen China, MD

## 2010-12-28 NOTE — Assessment & Plan Note (Signed)
[facility]                         ELECTROPHYSIOLOGY OFFICE NOTE   NAME:Carmen Brown, Carmen Brown                      MRN:          [id number]  DATE:07/19/2007                            DOB:          1941-07-02    Ms. Kocher returns today for followup.  She is a very pleasant middle-aged  woman with repetitive monomorphic VT which has been quiescent on  amiodarone therapy.  She returns today for followup.  She has had no  dyspnea and other symptoms.  Her LV function was normal.   PHYSICAL EXAMINATION:  GENERAL:  She is a pleasant, well-appearing woman  in no acute distress.  VITAL SIGNS:  Blood pressure today was 134/84, the pulse 79 and regular,  respirations were 18, and the weight was 184 pounds.  NECK:  Revealed no jugular venous distention.  CHEST:  Clear bilaterally to auscultation.  No wheezes, rales, or  rhonchi are present.  CARDIOVASCULAR:  Revealed a regular rate and rhythm, with normal S1 and  S2.  EXTREMITIES:  Demonstrated no edema.   MEDICATIONS:  1. Lisinopril 20 a day.  2. Coreg 3.125 twice daily.  3. Amiodarone 200 mg twice daily.  4. Lasix 20 mg twice daily.  5. Aspirin.  6. Potassium supplements.   EKG demonstrates sinus rhythm.   IMPRESSION:  1. Repetitive monomorphic ventricular tachycardia.  2. Amiodarone therapy.  3. Elevated TSH.   DISCUSSION:  Ms. Staron is stable.  I have asked that she decrease her  dose of amiodarone to 200 mg daily.  I have asked that she increase her  dose of Coreg to 6 mg twice daily.  I will plan to see her back in the  office in several months, with plans to decrease her amiodarone  additionally at that time.   Sodium is 130, potassium 2.8, chloride 95, CO2 27, BUN 6, creatinine 1,  glucose 157.  PT/INR is 16.3 and 1.5, respectively.  White count 17.2,  hemoglobin 10.1, hematocrit 29.7.  Her platelets are clumped on the  specimen.  EKG is pending.   ASSESSMENT AND PLAN:  This is a 70 year old,  African-American female  status post left knee replacement who developed tachycardia, low-grade  temperatures.  CT shows no pulmonary embolus but does show pneumonia  bilaterally.  1. For the pneumonia, her lung exam is pretty good this evening.  We      will therefore only start her on a flutter valve treatment q.i.d.,      Humibid, and humidified air.  Will continue her Zosyn and her      Advair.  2. Cardiovascular.  She is tachycardic.  This is likely a combination      of pain, pneumonia, and dehydration.  We will therefore check an      EKG, consider calcium channel blocker 30 p.o. q.6 h. if needed, but      I will avoid beta blockers secondary to her asthma history.  We      will also gently rehydrate her.  3. GI.  The patient does complain of some constipation.  I will add  Colace as a standing order, and she can use her p.r.n. medications      as needed.  4. We will check a TSH to rule out this as a source of her      tachycardia.  Her increased glucose is likely secondary to the D5      IV fluids, which we will change to normal saline and recheck in the      morning.  Her platelets we will also recheck in the morning.   We will follow along with you.  Thank you for allowing [place] to see your  patient.     [name]. [name], MD  Electronically Signed    GWT/MedQ  DD: 07/19/2007  DT: 07/20/2007  Job #: [id number]   cc:   Jonita Albee, M.D.

## 2010-12-28 NOTE — Discharge Summary (Signed)
Carmen Brown, Carmen Brown               ACCOUNT NO.:  [id number]   MEDICAL RECORD NO.:  [id number]          PATIENT TYPE:  INP   LOCATION:  2916                         FACILITY:  [facility]   PHYSICIAN:  Learta Codding, MD,FACC DATE OF BIRTH:  1941-07-22   DATE OF ADMISSION:  06/17/2007  DATE OF DISCHARGE:                               DISCHARGE SUMMARY   ALLERGIES:  No known drug allergies.   FINAL DIAGNOSIS:  1. Admitted with persistent dyspnea on exertion/dizzy spells.  2. 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  3. No recurrence after treatment with amiodarone and Coreg.  4. Troponin I studies this admission were negative x3.  5. Echocardiogram November 3 ejection fraction of 50% with mild      hypokinesis of the base and mid inferior wall.  6. Left heart catheterization November 3 angiographically normal      coronaries ejection fraction 45-50%.  7. 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  8. Elevated liver function studies. November 2, SGOT was 102, SGPT was      114.  Alkaline phosphatase 45.   PLAN:  1. Follow-up with Dr. Lewayne Bunting Tuesday December 4 at 3:30. At that      time he will set her up for a cardiac event monitor and for a      repeat echocardiogram. Two days prior, Tuesday December 2, she will      have a liver panel at any time at [facility] after 8:30.   SECONDARY DIAGNOSES:  1. Hypertension.  2. Cholelithiasis.   BRIEF HISTORY:  Carmen Brown is a 70 year old female. She has a history of  dyspnea and hypertension.  She has been reporting shortness of breath  which has been going on for several months.  At times she feels  extremely weak with  episodes of dizziness.   The patient is a flight attendant and she notes that her colleagues  notice that she becomes weak and dizzy without a definite syncope.  She  does state that in the last 3 weeks she had become  more short of breath  with minimal exertion.  She finds herself huffing and puffing which she  runs to the airport trying to catch a flight.  In the past she has had  an echocardiographic study done which was normal.   Because of the progressive dyspnea and with a new onset of cough, the  patient went to an Urgent Care Center today.  Electrocardiogram there  demonstrated a repetitive monomorphic ventricular tachycardia.  The  patient was referred to the emergency room by Dr. Mila Homer.  She had some  complaints of chest tightness and shortness of breath.  There was also a concern regarding possible myocardial infarction. On  arrival in the emergency room, the patient had repetitive monomorphic VT  with left bundle branch block morphology and inferior access. Her sinus  beats do not demonstrate any ST elevation. The patient has received IV  amiodarone in the field already and a second bolus in the emergency  room.  A bedside echocardiogram was done and showed ejection fraction  estimated at 35-40%. It was difficult to estimate the ejection fraction  due to her recurrent and intermittent VT.  The patient does not appear  be in frank heart failure.   PLAN:  The plan will be for the patient to continue IV amiodarone. She  will have a repeat echocardiogram and if the ejection fraction is  reduced, she will present for a diagnostic left heart catheterization.  She will also have her cardiac enzymes cycled.   HOSPITAL COURSE:  The patient presented to [facility] through  the emergency room at the request of Dr. Mila Homer at Urgent  Care when electrocardiogram showed repetitive monomorphic ventricular  tachycardia in the setting of a patient with dyspnea on exertion and  dizzy spells she was started on IV amiodarone which after two boluses  managed abort her intermittent and repetitive VT with no recurrence  during a 72-hour stay here at [facility].  Troponin  I studies  were cycled and they were normal  x3.  She had an echocardiogram on  November 3 which showed ejection fraction of 50% with mild hypokinesis  at the base and mid inferior wall.  She then underwent left heart  catheterization the same day November 3 which showed angiographically  normal coronaries and an ejection fraction 45-50%.  This was done by Dr.  Charlton Haws. She was seen in consultation by Dr. Lewayne Bunting for the  possibility of an electrophysiology study. However, the plan will be  since the patient has no coronary artery disease by catheterization  continue VT suppression with amiodarone and Coreg and repeat a 2-D  echocardiogram in 4-6 weeks and she will also wear a monitor as an  outpatient. As mentioned above her liver function studies were elevated  on the studies done November 2.  These will be followed up as an  outpatient on Tuesday December 2.   PROCEDURES THIS ADMISSION:  1. 2-D echocardiogram November 3 as dictated above.  2. A left heart catheterization November 3 as dictated above.   MEDICATIONS:  The patient discharged on the following medication:  1. Lisinopril 20 mg daily.  2. Coreg 3.125 mg twice daily.  3. Amiodarone 200 mg tablets 1 tablet in the morning 1 tablet in the      evening.  4. Lasix 20 mg daily.  5. Potassium chloride 10 mEq daily.  These are all new medications.  6. Claritin 10 mg daily.  7. Afrin 3 sprays per nostril twice daily as needed.   She follows up with [facility] on [address]  for lab work Tuesday December 2 any time after 8:30 the morning.  This  will be a liver function panel since the patient is on amiodarone.  She  will also see Dr. [name] Thursday December 4 at 3:30. At the laboratory  studies on this Tuesday December 2 we will also had a thyroid-  stimulating hormone.   LABORATORY STUDIES:  This admission the D-dimer was 1.15.  TSH was  4.137. The BNP on admission was 183.  Complete blood  count on the day of  discharge November 5 white cells 8.1, hemoglobin 12.5, hematocrit 37.1,  platelets of 148.  Serum electrolytes the day of discharge sodium 138,  potassium is 4, chloride 103, carbonate 29, BUN is 15, creatinine 1.25,  glucose 101.  Pro time this admission was 14.5, INR 1.1. As mentioned  above, SGOT is  102, SGPT is 114, and alkaline phosphatase is 45. Troponin I studies  were less 0.05, then 0.02, then 0.03, magnesium is 2.3 this admission.  Drugs of abuse none detected.   Greater than 40 minutes for this.      Maple Mirza, PA      Learta Codding, MD,FACC  Electronically Signed    GM/MEDQ  D:  06/20/2007  T:  06/21/2007  Job:  [id number]   cc:   [name]. [name], MD  [facility]

## 2010-12-28 NOTE — Cardiovascular Report (Signed)
Carmen Brown, Carmen Brown               ACCOUNT NO.:  [id number]   MEDICAL RECORD NO.:  [id number]          PATIENT TYPE:  INP   LOCATION:  2916                         FACILITY:  [facility]   PHYSICIAN:  Noralyn Pick. Eden Emms, MD, FACCDATE OF BIRTH:  05/27/1941   DATE OF PROCEDURE:  06/18/2007  DATE OF DISCHARGE:                            CARDIAC CATHETERIZATION   PROCEDURE:  Coronary arteriography.   INDICATIONS:  Monomorphic VT.   Cine catheterization done with a 6-French catheter from the right  femoral artery.  At the end of the case we used the StarClose device  with good hemostasis.   FINDINGS:  1. The patient had a codominant circulation left main coronary was      normal.  2. Left anterior descending artery was normal.  3. First diagonal branch was normal and small.  4. Circumflex coronary artery had a large obtuse marginal branch which      was normal.  The second obtuse marginal branch was also normal.  5. The distal circumflex also provided what appeared to be either a      third OM or a codominant system.  It was normal.  6. Right coronary artery was codominant and normal.   RAO VENTRICULOGRAPHY:  RAO ventriculography showed global hypokinesis,  worse in the inferior wall.  The EF was 45-50%.  There are no  significant MR.  LV pressure was 140/18.  Aortic pressure was 143/87.   IMPRESSION:  The patient does not have significant coronary artery  disease.  LV function is mildly decreased.  The LV EDP is normal.  The  patient will have an EP study tomorrow to further investigate her  monomorphic VT.      Noralyn Pick. Eden Emms, MD, [facility]  Electronically Signed     PCN/MEDQ  D:  06/18/2007  T:  06/19/2007  Job:  [id number]

## 2010-12-28 NOTE — Assessment & Plan Note (Signed)
[facility]                         ELECTROPHYSIOLOGY OFFICE NOTE   NAME:Carmen Brown, Carmen Brown                      MRN:          [id number]  DATE:10/19/2007                            DOB:          1941-03-25    REASON FOR VISIT:  The patient returns here for follow up.   HISTORY OF THE PRESENT ILLNESS:  The patient is a very pleasant woman  with a history of ventricular tachycardia and hypertension who returns  today for follow up.  Initially she was found to have LV dysfunction  with an EF of approximately 35-40%.  Her ventricular tachycardia was not  thought to be reentrant, but rather repetitive monomorphic.  She has  been treated with amiodarone, beta blockers, ACE inhibitors and  diuretics for all of the above.  Again, she returns today for follow up.   The patient notes that in the interim she decreased her dose of Coreg  from six to three twice daily and reduced her Lasix to 20 mg daily.  With these inadvertent changes she actually improved her symptoms and  she has had no more ventricular tachycardia.  Interestingly enough when  the patient had her repetitive ventricular tachycardia she was really  not having palpitations, but more shortness of breath and fatigue.   MEDICATIONS:  The patient's medications include:  1. Lisinopril 20 a day.  2. Lasix 20 a day.  3. Potassium supplement.  4. Aspirin 81 a day.  5. Coreg 3.125 twice daily.  6. Amiodarone 200 a day.   PHYSICAL EXAMINATION:  GENERAL APPEARANCE:  On exam she is a pleasant  well-appearing woman in no distress.  VITAL SIGNS:  Blood pressure is 130/74.  The pulse is 69 and regular.  The respirations are 18.  The weight is 178 pounds.  NECK:  The neck reveals no jugular distention.  LUNGS:  The lungs are clear bilaterally to auscultation.  No wheezes,  rales or rhonchi are present.  HEART:  The cardiovascular exam reveals a regular rate and rhythm with  normal S1 and S2.  EXTREMITIES:  The extremities demonstrate no edema.   IMPRESSION:  1. History of repetitive monomorphic ventricular tachycardia, now      quiescent.  2. 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   DISCUSSION:  The patient is stable.  I have asked that we repeat her 2-D  echo; and, if her LV function has improved then we will discontinue her  ACE inhibitor and/or her beta blocker plus or minus her diuretic.  I  will see her back in several months.     [name]. [name], MD  Electronically Signed    GWT/MedQ  DD: 10/19/2007  DT: 10/20/2007  Job #: [id number]   cc:   Jonita Albee, M.D.

## 2010-12-31 NOTE — Op Note (Signed)
NAMEMICHELYN, SCULLIN               ACCOUNT NO.:  [id number]   MEDICAL RECORD NO.:  [id number]          PATIENT TYPE:  AMB   LOCATION:  ENDO                         FACILITY:  [facility]   PHYSICIAN:  Anselmo Rod, M.D.  DATE OF BIRTH:  1941/02/13   DATE OF PROCEDURE:  10/07/2005  DATE OF DISCHARGE:                                 OPERATIVE REPORT   PROCEDURE:  Screening colonoscopy.   ENDOSCOPIST:  Anselmo Rod, M.D.   INSTRUMENT USED:  Olympus video colonoscope.   INDICATIONS FOR PROCEDURE:  70 year old white female undergoing screening  colonoscopy to rule out colonic polyps, masses, etc.   PREPROCEDURE PREPARATION:  Informed consent was obtained from the patient.  The patient was prepped with Osmo prep pills the night of and the morning of  the procedure.  The risks and benefits of the procedure including a 10% miss  rate of cancer and polyps were discussed with the patient, as well.   PREPROCEDURE PHYSICAL:  Patient with stable vital signs.  Neck supple.  Chest clear to auscultation.  S1 and S2 regular.  Abdomen soft with normal  bowel sounds.   DESCRIPTION OF PROCEDURE:  The patient was placed in the left lateral  decubitus position, sedated with 75 mcg Fentanyl and 4 mg Versed in slow  incremental doses.  Once the patient was adequately sedated, maintained on  low flow oxygen and continuous cardiac monitoring, the Olympus video  colonoscope was advanced from the rectum to the cecum.  There was some  residual stool in the right colon, multiple washes were done.  No masses,  polyps, erosions, ulcerations, or diverticula were seen.  Retroflexion in  the rectum revealed no abnormalities.  The patient tolerated the procedure  well without immediate complications.   IMPRESSION:  Normal colonoscopy to the cecum, no masses, polyps, or  diverticula seen.   RECOMMENDATIONS:  1.  Continue high fiber diet with liberal fluid intake.  2.  Repeat colonoscopy is recommended in the  next ten years unless the      patient develops any abnormal symptoms in the interim.  3.  Outpatient follow up as need arises in the future.      Anselmo Rod, M.D.  Electronically Signed     JNM/MEDQ  D:  10/07/2005  T:  10/07/2005  Job:  [id number]   cc:   Sibyl Parr. Darrick Penna, M.D.  Fax: [phone]   Gita Kudo, M.D.  [address]  [place]  [place] [address]

## 2011-05-24 LAB — CBC
HCT: 35.2 — ABNORMAL LOW
HCT: 37.1
HCT: 41.6
Hemoglobin: 12.1
Hemoglobin: 12.6
MCHC: 33.7
MCHC: 33.8
MCV: 96.2
MCV: 96.3
Platelets: 148 — ABNORMAL LOW
Platelets: 201
RBC: 3.62 — ABNORMAL LOW
RDW: 14.1 — ABNORMAL HIGH
RDW: 14.3 — ABNORMAL HIGH
RDW: 14.8 — ABNORMAL HIGH

## 2011-05-24 LAB — BASIC METABOLIC PANEL
BUN: 15
CO2: 29
Calcium: 8.6
Chloride: 103
GFR calc Af Amer: 59 — ABNORMAL LOW
GFR calc non Af Amer: 43 — ABNORMAL LOW
GFR calc non Af Amer: 48 — ABNORMAL LOW
GFR calc non Af Amer: 48 — ABNORMAL LOW
Glucose, Bld: 101 — ABNORMAL HIGH
Glucose, Bld: 92
Potassium: 4
Potassium: 4.3
Sodium: 138
Sodium: 141

## 2011-05-24 LAB — I-STAT 8, (EC8 V) (CONVERTED LAB)
BUN: 15
Bicarbonate: 20.7
HCT: 44
Hemoglobin: 15
Operator id: 284141
Potassium: 5.2 — ABNORMAL HIGH
Sodium: 139
TCO2: 22

## 2011-05-24 LAB — PROTIME-INR: INR: 1.1

## 2011-05-24 LAB — COMPREHENSIVE METABOLIC PANEL
Albumin: 3.5
Chloride: 107
GFR calc Af Amer: >60
GFR calc non Af Amer: 51 — ABNORMAL LOW
Potassium: 4.9
Sodium: 138
Total Bilirubin: 1.5 — ABNORMAL HIGH

## 2011-05-24 LAB — POCT CARDIAC MARKERS
CKMB, poc: <1 — ABNORMAL LOW
Myoglobin, poc: 84.9

## 2011-05-24 LAB — DIFFERENTIAL
Basophils Absolute: 0
Basophils Relative: 1
Lymphocytes Relative: 23
Monocytes Absolute: 0.6
Monocytes Relative: 8

## 2011-05-24 LAB — CARDIAC PANEL(CRET KIN+CKTOT+MB+TROPI)
Relative Index: INVALID
Relative Index: INVALID
Total CK: 48
Troponin I: 0.02

## 2011-05-24 LAB — RAPID URINE DRUG SCREEN, HOSP PERFORMED
Amphetamines: NOT DETECTED
Tetrahydrocannabinol: NOT DETECTED

## 2011-05-24 LAB — LIPID PANEL
Cholesterol: 127
Total CHOL/HDL Ratio: 5.1

## 2011-05-24 LAB — MAGNESIUM
Magnesium: 2.3
Magnesium: 2.3

## 2011-05-24 LAB — B-NATRIURETIC PEPTIDE (CONVERTED LAB): Pro B Natriuretic peptide (BNP): 183 — ABNORMAL HIGH

## 2011-06-29 ENCOUNTER — Encounter: Payer: Self-pay | Admitting: Home Health Services

## 2011-09-08 ENCOUNTER — Ambulatory Visit (INDEPENDENT_AMBULATORY_CARE_PROVIDER_SITE_OTHER): Payer: BC Managed Care – PPO

## 2011-09-08 DIAGNOSIS — Z23 Encounter for immunization: Secondary | ICD-10-CM

## 2011-09-08 DIAGNOSIS — M542 Cervicalgia: Secondary | ICD-10-CM

## 2011-09-08 DIAGNOSIS — M159 Polyosteoarthritis, unspecified: Secondary | ICD-10-CM

## 2011-10-17 ENCOUNTER — Other Ambulatory Visit: Payer: Self-pay | Admitting: Family Medicine

## 2011-10-17 MED ORDER — FLUTICASONE PROPIONATE 50 MCG/ACT NA SUSP: 2.0000 | Freq: Every day | NASAL | Status: DC

## 2011-10-22 ENCOUNTER — Ambulatory Visit (INDEPENDENT_AMBULATORY_CARE_PROVIDER_SITE_OTHER): Payer: BC Managed Care – PPO | Admitting: Family Medicine

## 2011-10-22 VITALS — BP 183/91 | HR 70 | Temp 98.0°F | Resp 16

## 2011-10-22 DIAGNOSIS — I10 Essential (primary) hypertension: Secondary | ICD-10-CM

## 2011-10-22 NOTE — Patient Instructions (Signed)

## 2011-10-22 NOTE — Progress Notes (Signed)
71 yo woman with past h/o ventricular tachycardia that has resolved and now has hypertension.  She has had several high readings lately.  Also noted some left leg swelling after using elastic lower leg band for lower knee pain. No significant chest pain, palpitations or shortness of breath.  O:  NAD  140/90 Chest - clear Heart - reg, no murmur Ext - trace edema  A:  I think the slight swelling is from the elastic wrap.  BP is borderline but I think she can monitor it  P:  Monitor BP, physical in next two months.

## 2011-11-10 ENCOUNTER — Telehealth: Payer: Self-pay

## 2011-11-10 NOTE — Telephone Encounter (Signed)
In Dr L note he states he just wanted pt to monitor and d/c continued meds. Please clarify

## 2011-11-10 NOTE — Telephone Encounter (Signed)
PT STATES SHE IS OUT OF HER BLOOD PRESSURE MEDICINE AND HAVE FORGOTTEN THE NAME OF IT. HAD CALLED THE PHARMACY BUT WAS TOLD SHE HAD TO CALL us SINCE IT'S BEEN OVER A YEAR PLEASE CALL PT AT [phone]   [facility] [id number]

## 2011-11-10 NOTE — Telephone Encounter (Signed)
Please continue lisinopril 20 mg daily and monitor BP x 2wks.  RTC if BP elevated more than 25% of the time

## 2011-11-15 NOTE — Telephone Encounter (Signed)
Spoke with pt who reports she is on lisinopril and metoprolol and she had been calling bc she needs a RF on her metoprolol. She didn't want to be w/out and take a chance on her BP going up again. It has been more normal since her OV. A RF had been sent in for #30, but her ins will not pay unless it is 90 day supply. Looked back in chart and we had sent in #90 PLUS 1 RF last Dec, 2012. Called pharmacy and they got that Rx but didn't have record of the add'l RF. Called in OK for add'l RF #90 per Angela's Rx, and notified pt. Advised pt that she is overdue to have PE and Dr [name] had wanted her to schedule that. Pt agreed.

## 2012-02-07 ENCOUNTER — Ambulatory Visit (INDEPENDENT_AMBULATORY_CARE_PROVIDER_SITE_OTHER): Payer: BC Managed Care – PPO | Admitting: Family Medicine

## 2012-02-07 VITALS — BP 121/77 | HR 76 | Temp 97.8°F | Resp 17 | Ht 65.0 in | Wt 178.0 lb

## 2012-02-07 DIAGNOSIS — I10 Essential (primary) hypertension: Secondary | ICD-10-CM

## 2012-02-07 DIAGNOSIS — J019 Acute sinusitis, unspecified: Secondary | ICD-10-CM

## 2012-02-07 MED ORDER — LISINOPRIL 20 MG PO TABS: 20.0000 mg | ORAL_TABLET | Freq: Every day | ORAL | Status: DC

## 2012-02-07 MED ORDER — METOPROLOL SUCCINATE ER 25 MG PO TB24: 25.0000 mg | ORAL_TABLET | Freq: Every day | ORAL | Status: DC

## 2012-02-07 NOTE — Progress Notes (Signed)
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 Has no major acute complaints. Takes lisinopril and Toprol. She plans to get a complete physical sometime soon with Dr. [name]  . Objective: No acute distress. TMs normal. Throat clear. Neck supple without nodes. Chest clear. Heart regular without murmurs.  Assessment: Hypertension controlled  Plan  continue same meds and see her back sometime for physical examination.

## 2012-02-08 ENCOUNTER — Ambulatory Visit (INDEPENDENT_AMBULATORY_CARE_PROVIDER_SITE_OTHER): Payer: BC Managed Care – PPO | Admitting: Family Medicine

## 2012-02-08 ENCOUNTER — Encounter: Payer: Self-pay | Admitting: Family Medicine

## 2012-02-08 VITALS — BP 120/80 | HR 81 | Temp 97.4°F | Resp 18 | Ht 64.5 in | Wt 177.6 lb

## 2012-02-08 DIAGNOSIS — K1120 Sialoadenitis, unspecified: Secondary | ICD-10-CM

## 2012-02-08 MED ORDER — AMOXICILLIN 875 MG PO TABS: 875.0000 mg | ORAL_TABLET | Freq: Two times a day (BID) | ORAL | Status: DC

## 2012-02-08 NOTE — Progress Notes (Signed)
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 She's had no fever.  Objective: No acute distress Right parotid swelling is mild TMs normal Oropharynx: Patient has mild swelling at the opening of Stensen's duct Skin: No erythema or vesicular eruption Neck adenopathy is minimal on the right side  Assessment: Acute parotitis  Plan: 1. Parotitis  amoxicillin (AMOXIL) 875 MG tablet

## 2012-02-08 NOTE — Patient Instructions (Addendum)
   C.  Document Released: 01/21/2002 Document Revised: 07/21/2011 Document Reviewed: 06/27/2011 [facility] Patient Information 2012 [place], [place].

## 2012-02-10 ENCOUNTER — Telehealth: Payer: Self-pay

## 2012-02-10 MED ORDER — AZELASTINE HCL 0.15 % NA SOLN: NASAL | Status: DC

## 2012-02-10 NOTE — Telephone Encounter (Signed)
Likely viral then. What is patient taking in addition to the Amoxicillin? We can add symptomatic medications.

## 2012-02-10 NOTE — Telephone Encounter (Signed)
Please advise 

## 2012-02-10 NOTE — Telephone Encounter (Signed)
PATIENT STATES SHE SAW DR. Milus Glazier ON Wednesday FOR STREP. HE GAVE HER AMOXICILLIN AND TOLD HER SHE SHOULD BE FEELING BETTER BY TODAY. SHE FEELS WORSE. SHE HAS THROBBING PAIN IN HER SINUSES, EARS HURT AND DRAINAGE IN HER THROAT. SHE WOULD LIKE TO GET A DIFFERENT ANTIBOTIC PLEASE.  BEST PHONE [phone] (HOME)   PHARMACY CHOICE IS [facility] ([place] ON [address])    MBC

## 2012-02-10 NOTE — Telephone Encounter (Signed)
Dr L gave verbal order to Rx Astepro NS for pt. Sent in Rx and notified pt. Also suggested pt take Mucinex and can try salt water gargles and chloraseptic spray for throat. Pt agreed and thanked [place].

## 2012-02-10 NOTE — Telephone Encounter (Signed)
Pt CB and stated she is just feeling even worse w/Sxs - sinus pressure, into R ear and jaw w/swelling in these areas. She also has a lot of drainage down her throat and her throat is sore. Between the pain and the drainage choking her she is having a lot of trouble sleeping. Is there anything else we can add for Sx relief,and/or prednisone for sinus pressure?

## 2012-02-13 ENCOUNTER — Telehealth: Payer: Self-pay

## 2012-02-13 DIAGNOSIS — K1120 Sialoadenitis, unspecified: Secondary | ICD-10-CM

## 2012-02-13 MED ORDER — AMOXICILLIN 875 MG PO TABS: 875.0000 mg | ORAL_TABLET | Freq: Two times a day (BID) | ORAL | Status: AC

## 2012-02-13 NOTE — Telephone Encounter (Signed)
Patient called to check on status of message. Also wanted to add that she is having noise in her ears, like if paper was being crinkled. She states she still feels lousy. Seems congested but isn't sure if that's really the case.Marland KitchenMarland Kitchen

## 2012-02-13 NOTE — Telephone Encounter (Signed)
PT STATES SHE IS STILL HAVING PAIN IN THE BACK OF HER JAW AND DIDN'T KNOW IF THAT WAS NORMAL. ALSO WAS GIVEN AN OOW NOTE UNTIL THE 28TH, BUT WOULD LIKE TO KNOW IF SHE COULD GET IT FOR THE 1ST OF July. 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 PLEASE CALL [phone] OR HER CELL AT [phone]

## 2012-02-13 NOTE — Telephone Encounter (Signed)
Can take some time to resolve. If persists RTC. Ok for note. ABX refilled.

## 2012-02-14 ENCOUNTER — Ambulatory Visit (INDEPENDENT_AMBULATORY_CARE_PROVIDER_SITE_OTHER): Payer: BC Managed Care – PPO | Admitting: Family Medicine

## 2012-02-14 VITALS — BP 150/88 | HR 78 | Temp 98.6°F | Resp 16 | Ht 64.5 in | Wt 177.0 lb

## 2012-02-14 DIAGNOSIS — H6990 Unspecified Eustachian tube disorder, unspecified ear: Secondary | ICD-10-CM

## 2012-02-14 DIAGNOSIS — J3489 Other specified disorders of nose and nasal sinuses: Secondary | ICD-10-CM

## 2012-02-14 DIAGNOSIS — R0981 Nasal congestion: Secondary | ICD-10-CM

## 2012-02-14 DIAGNOSIS — H6980 Other specified disorders of Eustachian tube, unspecified ear: Secondary | ICD-10-CM

## 2012-02-14 DIAGNOSIS — J329 Chronic sinusitis, unspecified: Secondary | ICD-10-CM

## 2012-02-14 MED ORDER — AMOXICILLIN 875 MG PO TABS: ORAL_TABLET | ORAL | Status: DC

## 2012-02-14 MED ORDER — METHYLPREDNISOLONE SODIUM SUCC 125 MG IJ SOLR: 80.0000 mg | Freq: Once | INTRAMUSCULAR | Status: DC

## 2012-02-14 MED ORDER — METHYLPREDNISOLONE ACETATE 80 MG/ML IJ SUSP
80.0000 mg | Freq: Once | INTRAMUSCULAR | Status: AC
  Administered 2012-02-14: 80 mg via INTRAMUSCULAR

## 2012-02-14 NOTE — Patient Instructions (Addendum)
Use the fluticasone spray 1 or 2 sprays each nostril twice daily of 3 days, then cut back to once daily  Use the Astelin spray 1 or 2 sprays twice daily  Continue the antibiotics.  Carry extra antibiotics with you in case of problems when you are traveling.  Off 02/08/12-02/16/12

## 2012-02-14 NOTE — Telephone Encounter (Signed)
Pt notified and has decided to come for a OV.

## 2012-02-14 NOTE — Progress Notes (Signed)
Subjective: Patient is continued have problems with pain in her face, tissue paper noise in her ear, pain in the right side of her face, pain with swallowing. Not coughing much. She has improved in that the swelling is going down. 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  Objective TMs normal nose congested throat clear neck supple without significant nodes. Chest clear. Heart regular.  Assessment: Sinusitis Eustachian tube dysfunction Lymphadenitis Possible parotid infection, improved    Plan: Flonase twice a day, Astelin twice a day for 3 days, then drop back to once a day. Finish her antibiotic course. Gave her another prescription for amoxicillin to have on hand in case she has problems while she is traveling. Return if problems. Work excuse.

## 2012-02-21 ENCOUNTER — Ambulatory Visit (INDEPENDENT_AMBULATORY_CARE_PROVIDER_SITE_OTHER): Payer: BC Managed Care – PPO | Admitting: Internal Medicine

## 2012-02-21 VITALS — BP 136/88 | HR 86 | Temp 98.3°F | Resp 16 | Ht 64.0 in | Wt 177.0 lb

## 2012-02-21 DIAGNOSIS — Z79899 Other long term (current) drug therapy: Secondary | ICD-10-CM

## 2012-02-21 DIAGNOSIS — E039 Hypothyroidism, unspecified: Secondary | ICD-10-CM

## 2012-02-21 DIAGNOSIS — Z23 Encounter for immunization: Secondary | ICD-10-CM

## 2012-02-21 DIAGNOSIS — Z7189 Other specified counseling: Secondary | ICD-10-CM

## 2012-02-21 DIAGNOSIS — Z0000 Encounter for general adult medical examination without abnormal findings: Secondary | ICD-10-CM

## 2012-02-21 DIAGNOSIS — I10 Essential (primary) hypertension: Secondary | ICD-10-CM

## 2012-02-21 DIAGNOSIS — I428 Other cardiomyopathies: Secondary | ICD-10-CM

## 2012-02-21 LAB — POCT URINALYSIS DIPSTICK
Bilirubin, UA: NEGATIVE
Blood, UA: NEGATIVE
Nitrite, UA: NEGATIVE
pH, UA: 5.5

## 2012-02-21 LAB — COMPREHENSIVE METABOLIC PANEL
AST: 23 U/L (ref 0–37)
BUN: 20 mg/dL (ref 6–23)
CO2: 29 mEq/L (ref 19–32)
Calcium: 9.9 mg/dL (ref 8.4–10.5)
Chloride: 102 mEq/L (ref 96–112)
Creat: 0.91 mg/dL (ref 0.50–1.10)

## 2012-02-21 LAB — POCT CBC
HCT, POC: 47.2 % (ref 37.7–47.9)
Hemoglobin: 14.7 g/dL (ref 12.2–16.2)
Lymph, poc: 2.5 (ref 0.6–3.4)
MCH, POC: 31.6 pg — AB (ref 27–31.2)
MCHC: 31.1 g/dL — AB (ref 31.8–35.4)
MCV: 101.4 fL — AB (ref 80–97)
WBC: 8.7 10*3/uL (ref 4.6–10.2)

## 2012-02-21 LAB — POCT UA - MICROSCOPIC ONLY
Crystals, Ur, HPF, POC: NEGATIVE
Mucus, UA: NEGATIVE
Yeast, UA: NEGATIVE

## 2012-02-21 LAB — LIPID PANEL
Cholesterol: 188 mg/dL (ref 0–200)
HDL: 47 mg/dL (ref >39)
Total CHOL/HDL Ratio: 4 Ratio

## 2012-02-21 NOTE — Progress Notes (Signed)
Subjective:    Patient ID: Carmen Brown, female    DOB: Sep 15, 1940, 71 y.o.   MRN: [id number]  HPI Doing well. Paps nl 2012, colonoscopy nl 2010, mammogram utd and scheduled. Needs meds rf prn, travels [place] airlines [place] over [place]. See scanned hx Needs Tdap   Review of Systems See scanned ros    Objective:   Physical Exam  Constitutional: She is oriented to person, place, and time. She appears well-developed and well-nourished. No distress.  HENT:  Right Ear: External ear normal.  Left Ear: External ear normal.  Nose: Nose normal.  Mouth/Throat: Oropharynx is clear and moist.  Eyes: EOM are normal. Pupils are equal, round, and reactive to light.  Neck: Normal range of motion. Neck supple. No thyromegaly present.  Cardiovascular: Normal rate, regular rhythm and normal heart sounds.   Pulmonary/Chest: Effort normal and breath sounds normal.  Abdominal: Soft. Bowel sounds are normal. She exhibits no mass. There is no tenderness.  Genitourinary: No breast swelling, tenderness, discharge or bleeding. Pelvic exam was performed with patient supine.  Musculoskeletal: Normal range of motion. She exhibits no tenderness.  Lymphadenopathy:    She has no cervical adenopathy.  Neurological: She is alert and oriented to person, place, and time. She has normal reflexes. She displays normal reflexes. No cranial nerve deficit. She exhibits normal muscle tone. Coordination normal.  Skin: Skin is warm and dry.  Psychiatric: She has a normal mood and affect. Her behavior is normal. Judgment and thought content normal.   ekg ok Results for orders placed in visit on 02/21/12  POCT CBC      Component Value Range   WBC 8.7  4.6 - 10.2 K/uL   Lymph, poc 2.5  0.6 - 3.4   POC LYMPH PERCENT 29.0  10 - 50 %L   MID (cbc) 0.5  0 - 0.9   POC MID % 6.3  0 - 12 %M   POC Granulocyte 5.6  2 - 6.9   Granulocyte percent 64.7  37 - 80 %G   RBC 4.65  4.04 - 5.48 M/uL   Hemoglobin 14.7  12.2 - 16.2 g/dL   HCT, POC 40.9  81.1 - 47.9 %   MCV 101.4 (*) 80 - 97 fL   MCH, POC 31.6 (*) 27 - 31.2 pg   MCHC 31.1 (*) 31.8 - 35.4 g/dL   RDW, POC 91.4     Platelet Count, POC 322  142 - 424 K/uL   MPV 9.2  0 - 99.8 fL  POCT UA - MICROSCOPIC ONLY      Component Value Range   WBC, Ur, HPF, POC 0-2     RBC, urine, microscopic 0-1     Bacteria, U Microscopic negative     Mucus, UA negative     Epithelial cells, urine per micros 0-2     Crystals, Ur, HPF, POC negative     Casts, Ur, LPF, POC negative     Yeast, UA negative    POCT URINALYSIS DIPSTICK      Component Value Range   Color, UA yellow     Clarity, UA clear     Glucose, UA negative     Bilirubin, UA negative     Ketones, UA negative     Spec Grav, UA 1.020     Blood, UA negative     pH, UA 5.5     Protein, UA negative     Urobilinogen, UA 0.2     Nitrite, UA  negative     Leukocytes, UA Negative            Assessment & Plan:  RF meds 1 year

## 2012-02-23 ENCOUNTER — Encounter: Payer: Self-pay | Admitting: *Deleted

## 2012-03-08 ENCOUNTER — Other Ambulatory Visit: Payer: Self-pay | Admitting: Orthopedic Surgery

## 2012-03-14 ENCOUNTER — Other Ambulatory Visit: Payer: Self-pay | Admitting: Internal Medicine

## 2012-03-14 DIAGNOSIS — Z1231 Encounter for screening mammogram for malignant neoplasm of breast: Secondary | ICD-10-CM

## 2012-03-14 NOTE — H&P (Signed)
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 She has had a history of a mucoid cyst developing on the dorsal aspect of her right long finger DIP joint. She reports draining this on several occasions at home when pressure builds up and reports that your PA has also drained it in the office. She has had rapid recurrence each time. She is now referred for an upper extremity orthopaedic consult.   She also would like to discuss 71 year old a chronic prominence of her left wrist overlying the ulnar head.   Her past medical history is reviewed in detail. She is 5'6", 175 lbs. She has not been using any medication for pain. She is intolerant to epinephrine. She cannot recall exactly why this diagnosis was established. Current medications include Lisinopril 20 mg daily, Metoprolol 25 mg daily. Prior surgery includes cardiac cath and treatment for ventricular tachycardia. Her social history reveals she is single. She is a nonsmoker. She enjoys a rare alcoholic beverage. His family history is detailed and positive for diabetes affecting her grandfather and uncle. Multiple primary relatives have osteoarthritis. A 14 system review of systems reveals corrective lenses age 71 to present, history of hypertension for multiple years.  Physical exam reveals a very pleasant 71 year old woman. Inspection of her hands reveals a typical mucoid cyst on the dorsal radial aspect of her right long finger DIP joint. She has full ROM. There is no nail deformity.   Inspection of her left wrist reveals significant prominence over the ulnar head. This is compatible with probable synovial swelling at the ulnar styloid. She has full ROM of her wrist and fingers. She does have a clinical deformity of her wrist that would suggest a prior wrist fracture.   When questioned, she states at age 71 she fractured her left wrist.  Her neurovascular exam is intact bilaterally.  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 She has a mild malunion of the distal radius. She is ulnar negative.   X-rays of her finger demonstrate changes of osteoarthritis at the DIP joints with loose bodies in the ulnar aspect of the index and long finger DIP joints.  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  Plan: We have advised Carmen Brown to proceed with debridement of her mucoid cyst and debridement of the DIP joint with loose body removal from the ulnar aspect of the joint at a mutually convenient time under local anesthesia. The surgery, after care, risks and benefits were described in detail. We will schedule this under local anesthesia at the [facility] at her convenience.  Dasnoit, Marveen Reeks. PA-C 03/15/2012  H&P documentation: 03/15/2012  -History and Physical Reviewed  -Patient has been re-examined  -No change in the plan of care  Wyn Forster, MD

## 2012-03-15 ENCOUNTER — Ambulatory Visit (HOSPITAL_BASED_OUTPATIENT_CLINIC_OR_DEPARTMENT_OTHER)
Admission: RE | Admit: 2012-03-15 | Discharge: 2012-03-15 | Disposition: A | Discharge status: Home / Self Care | Payer: BC Managed Care – PPO | Source: Ambulatory Visit | Attending: Orthopedic Surgery | Admitting: Orthopedic Surgery

## 2012-03-15 ENCOUNTER — Encounter (HOSPITAL_BASED_OUTPATIENT_CLINIC_OR_DEPARTMENT_OTHER)
Admission: RE | Disposition: A | Discharge status: Home / Self Care | Payer: Self-pay | Source: Ambulatory Visit | Attending: Orthopedic Surgery

## 2012-03-15 ENCOUNTER — Encounter (HOSPITAL_BASED_OUTPATIENT_CLINIC_OR_DEPARTMENT_OTHER): Payer: Self-pay

## 2012-03-15 DIAGNOSIS — M19049 Primary osteoarthritis, unspecified hand: Secondary | ICD-10-CM | POA: Insufficient documentation

## 2012-03-15 DIAGNOSIS — M6740 Ganglion, unspecified site: Secondary | ICD-10-CM | POA: Insufficient documentation

## 2012-03-15 DIAGNOSIS — M24049 Loose body in unspecified finger joint(s): Secondary | ICD-10-CM | POA: Insufficient documentation

## 2012-03-15 HISTORY — PX: MASS EXCISION: SHX2000

## 2012-03-15 SURGERY — MINOR EXCISION OF MASS
Anesthesia: LOCAL | Site: Finger | Laterality: Right | Wound class: Clean

## 2012-03-15 MED ORDER — LIDOCAINE HCL 2 % IJ SOLN
INTRAMUSCULAR | Status: DC | PRN
  Administered 2012-03-15: 3.5 mL

## 2012-03-15 MED ORDER — HYDROCODONE-ACETAMINOPHEN 5-325 MG PO TABS: ORAL_TABLET | ORAL | Status: AC

## 2012-03-15 MED ORDER — CEPHALEXIN 500 MG PO CAPS: 500.0000 mg | ORAL_CAPSULE | Freq: Three times a day (TID) | ORAL | Status: AC

## 2012-03-15 SURGICAL SUPPLY — 34 items
BANDAGE ADHESIVE 1X3 (GAUZE/BANDAGES/DRESSINGS) IMPLANT
BLADE SURG 15 STRL LF DISP TIS (BLADE) ×1 IMPLANT
BLADE SURG 15 STRL SS (BLADE) ×1
BNDG COHESIVE 1X5 TAN STRL LF (GAUZE/BANDAGES/DRESSINGS) IMPLANT
BNDG ELASTIC 2 VLCR STRL LF (GAUZE/BANDAGES/DRESSINGS) IMPLANT
BNDG ESMARK 4X9 LF (GAUZE/BANDAGES/DRESSINGS) IMPLANT
BRUSH SCRUB EZ PLAIN DRY (MISCELLANEOUS) ×2 IMPLANT
CLOTH BEACON ORANGE TIMEOUT ST (SAFETY) ×2 IMPLANT
CORDS BIPOLAR (ELECTRODE) IMPLANT
COVER MAYO STAND STRL (DRAPES) ×2 IMPLANT
CUFF TOURNIQUET SINGLE 18IN (TOURNIQUET CUFF) IMPLANT
DECANTER SPIKE VIAL GLASS SM (MISCELLANEOUS) IMPLANT
DRAIN PENROSE 1/2X12 LTX STRL (WOUND CARE) IMPLANT
DRAPE SURG 17X23 STRL (DRAPES) ×2 IMPLANT
GAUZE SPONGE 4X4 12PLY STRL LF (GAUZE/BANDAGES/DRESSINGS) ×4 IMPLANT
GAUZE XEROFORM 1X8 LF (GAUZE/BANDAGES/DRESSINGS) IMPLANT
GLOVE BIO SURGEON STRL SZ 6.5 (GLOVE) ×4 IMPLANT
GLOVE BIOGEL M STRL SZ7.5 (GLOVE) ×2 IMPLANT
GLOVE ORTHO TXT STRL SZ7.5 (GLOVE) ×2 IMPLANT
GOWN PREVENTION PLUS XLARGE (GOWN DISPOSABLE) ×2 IMPLANT
NEEDLE 27GAX1X1/2 (NEEDLE) IMPLANT
PACK BASIN DAY SURGERY FS (CUSTOM PROCEDURE TRAY) ×2 IMPLANT
PADDING CAST ABS 4INX4YD NS (CAST SUPPLIES) ×1
PADDING CAST ABS COTTON 4X4 ST (CAST SUPPLIES) ×1 IMPLANT
SPONGE GAUZE 4X4 12PLY (GAUZE/BANDAGES/DRESSINGS) ×2 IMPLANT
STOCKINETTE 4X48 STRL (DRAPES) ×2 IMPLANT
SUT ETHILON 5 0 P 3 18 (SUTURE) ×1
SUT NYLON ETHILON 5-0 P-3 1X18 (SUTURE) ×1 IMPLANT
SYR 3ML 23GX1 SAFETY (SYRINGE) IMPLANT
SYR CONTROL 10ML LL (SYRINGE) IMPLANT
TOWEL OR 17X24 6PK STRL BLUE (TOWEL DISPOSABLE) ×4 IMPLANT
TRAY DSU PREP LF (CUSTOM PROCEDURE TRAY) ×2 IMPLANT
UNDERPAD 30X30 INCONTINENT (UNDERPADS AND DIAPERS) ×2 IMPLANT
WATER STERILE IRR 1000ML POUR (IV SOLUTION) ×2 IMPLANT

## 2012-03-15 NOTE — Brief Op Note (Signed)
03/15/2012  9:36 AM  PATIENT:  Carmen Brown  71 y.o. female  PRE-OPERATIVE DIAGNOSIS:  mucoid cyst and loose body right long finger distal interphalangeal joint  POST-OPERATIVE DIAGNOSIS: mucoid cyst and loose body right long finger distal interphalangeal joint  PROCEDURE:  DEBRIDEMENT OF RIGHT LONG FINGER DISTAL INTERPHALANGEAL JOINT WITH MYXOID CYST EXCISION   SURGEON:  Wyn Forster., MD   PHYSICIAN ASSISTANT:   ASSISTANTS:Ridhaan Dreibelbis Dasnoit,P.A-C     ANESTHESIA:   local  EBL:     BLOOD ADMINISTERED:none  DRAINS: none   LOCAL MEDICATIONS USED:  LIDOCAINE   SPECIMEN:  No Specimen  DISPOSITION OF SPECIMEN:  N/A  COUNTS:  YES  TOURNIQUET:  * No tourniquets in log *  DICTATION: .Other Dictation: Dictation Number [id number]  PLAN OF CARE: Discharge to home after PACU  PATIENT DISPOSITION:  PACU - hemodynamically stable.

## 2012-03-16 ENCOUNTER — Encounter (HOSPITAL_BASED_OUTPATIENT_CLINIC_OR_DEPARTMENT_OTHER): Payer: Self-pay | Admitting: Orthopedic Surgery

## 2012-03-16 NOTE — Op Note (Signed)
NAMECALEA, HRIBAR               ACCOUNT NO.:  [id number]  MEDICAL RECORD NO.:  [id number]  LOCATION:                                 FACILITY:  PHYSICIAN:  Katy Fitch. Alzina Golda, M.D.      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  OPERATION:  Incision, irrigation, debridement, and removal of loose body of right long finger distal interphalangeal joint and resection of mucoid cyst from dorsal radial aspect of right long finger.  OPERATING SURGEON:  Katy Fitch. Yug Loria, M.D.  ASSISTANT:  Marveen Reeks. Dasnoit, PA-C.  ANESTHESIA:  2% lidocaine metacarpal head level block of right long finger.  This was performed as a minor operating room procedure.  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  She did not have a nail deformity, but had rupture and drainage of the cyst. Clinical examination revealed a classic mucoid cyst with Heberden's nodes at the distal interphalangeal joint.  We advised that we could not prevent progression of osteoarthritis nor could we change the pain of osteoarthritis, however, in general, the art of debridement of the distal interphalangeal joint and cyst excision with loose body resection has a high probability of resolving the mucoid cyst based on extensive prior experience.  We recommended that she try this treatment  modality.  She accepted our recommendation and proceeded to surgery at this time.  PROCEDURE IN DETAIL:  Tailor Lucking was brought to room #2 of the [facility] and placed in supine position on the operating table. Following detailed informed consent and alcohol Betadine prep, 2% lidocaine was infiltrated at metacarpal head level to obtain a digital block.  The right hand and arm were prepped with Betadine soap and solution and sterilely draped.  Following exsanguination of the right long finger with a gauze wrap, a 0.5-inch Penrose drain was placed over the proximal phalangeal segment of the finger as a digital tourniquet. The procedure commenced with a routine surgical time-out.  Thereafter curvilinear incisions were fashioned on the dorsal radial and dorsal ulnar aspect of the DIP joint.  The cyst was included in the dorsal radial incision.  The contents of the cyst were debrided and a sinus tract from the joint of the cyst excised.  Capsulotomy was performed on the dorsal radial and dorsal ulnar aspect of the DIP joint with resection of the capsule, synovectomy. Removal of marginal osteophytes at the base of the distal phalanx.  Excision of loose body from the dorsal ulnar aspect of the joint.  Use of a microcurette to perform synovectomy on the dorsum of the joint followed by irrigation with a 19- gauge blunt dental  needle and sterile saline.  The wound was then was inspected for bleeding points and none were problematic.  The skin incisions were repaired with interrupted suture of 5-0 nylon.  The wound was then dressed with Xeroflo sterile gauze and a light Coban dressing secured by tape.  Ms. Randa tolerated the surgery and anesthesia well.  She was transferred to the recovery room and will be discharged home awake and alert.  For aftercare, she was provided prescriptions for Keflex 500 mg 1 p.o. q.8 hours x4 days as a prophylactic antibiotic due to joint entry  and hydrocodone and acetaminophen 5 mg 1 p.o. q. 4-6 hours p.r.n. pain, #20 tablets without refill.  We will see her back for followup in 4 days for dressing change, 8 days for suture removal.     Katy Fitch. Ebbie Sorenson, M.D.     RVS/MEDQ  D:  03/15/2012  T:  03/16/2012  Job:  [id number]

## 2012-03-19 ENCOUNTER — Ambulatory Visit
Admission: RE | Admit: 2012-03-19 | Discharge: 2012-03-19 | Disposition: A | Discharge status: Home / Self Care | Payer: BC Managed Care – PPO | Source: Ambulatory Visit | Attending: Internal Medicine | Admitting: Internal Medicine

## 2012-03-19 DIAGNOSIS — Z1231 Encounter for screening mammogram for malignant neoplasm of breast: Secondary | ICD-10-CM

## 2012-04-28 ENCOUNTER — Ambulatory Visit (INDEPENDENT_AMBULATORY_CARE_PROVIDER_SITE_OTHER): Payer: BC Managed Care – PPO | Admitting: Internal Medicine

## 2012-04-28 VITALS — BP 145/90 | HR 82 | Temp 98.2°F | Resp 18 | Ht 64.5 in | Wt 182.0 lb

## 2012-04-28 DIAGNOSIS — T7840XA Allergy, unspecified, initial encounter: Secondary | ICD-10-CM

## 2012-04-28 NOTE — Progress Notes (Signed)
  Subjective:    Patient ID: Carmen Brown, female    DOB: 08/16/1940, 71 y.o.   MRN: [id number]  HPI  Pt had a shingles shot Wed afternoon. Now the sight of the injection is red, tender and warm.  Itching around the central part  Review of Systems     Objective:   Physical Exam The left upper arm has a central erythema 1.5 cm that is slightly indurated and nontender surrounded by A larger milder erythema 4 cm in diameter also nontender       Assessment & Plan:  Problem #1 allergic reaction probably to the preservative in Zostavax Take Claritin or zyrtec x 10 days Hot compress bid x 5 days

## 2012-05-14 ENCOUNTER — Other Ambulatory Visit: Payer: Self-pay | Admitting: Physician Assistant

## 2012-05-14 ENCOUNTER — Other Ambulatory Visit: Payer: Self-pay | Admitting: Surgery

## 2012-08-10 ENCOUNTER — Other Ambulatory Visit: Payer: Self-pay | Admitting: Physician Assistant

## 2012-08-10 NOTE — Telephone Encounter (Signed)
Needs office visit.

## 2012-11-05 ENCOUNTER — Encounter (HOSPITAL_COMMUNITY): Payer: Self-pay | Admitting: *Deleted

## 2012-11-05 ENCOUNTER — Observation Stay (HOSPITAL_COMMUNITY): Payer: BC Managed Care – PPO

## 2012-11-05 ENCOUNTER — Other Ambulatory Visit: Payer: Self-pay | Admitting: Diagnostic Radiology

## 2012-11-05 ENCOUNTER — Observation Stay (HOSPITAL_COMMUNITY)
Admission: EM | Admit: 2012-11-05 | Discharge: 2012-11-05 | Disposition: A | Discharge status: Home / Self Care | Payer: BC Managed Care – PPO | Attending: Family Medicine | Admitting: Family Medicine

## 2012-11-05 ENCOUNTER — Emergency Department (HOSPITAL_COMMUNITY): Payer: BC Managed Care – PPO

## 2012-11-05 DIAGNOSIS — I472 Ventricular tachycardia, unspecified: Secondary | ICD-10-CM | POA: Insufficient documentation

## 2012-11-05 DIAGNOSIS — R0789 Other chest pain: Principal | ICD-10-CM | POA: Diagnosis present

## 2012-11-05 DIAGNOSIS — E669 Obesity, unspecified: Secondary | ICD-10-CM | POA: Diagnosis present

## 2012-11-05 DIAGNOSIS — R791 Abnormal coagulation profile: Secondary | ICD-10-CM | POA: Insufficient documentation

## 2012-11-05 DIAGNOSIS — J309 Allergic rhinitis, unspecified: Secondary | ICD-10-CM | POA: Diagnosis present

## 2012-11-05 DIAGNOSIS — I77810 Thoracic aortic ectasia: Secondary | ICD-10-CM | POA: Insufficient documentation

## 2012-11-05 DIAGNOSIS — R079 Chest pain, unspecified: Secondary | ICD-10-CM

## 2012-11-05 DIAGNOSIS — I10 Essential (primary) hypertension: Secondary | ICD-10-CM | POA: Diagnosis present

## 2012-11-05 DIAGNOSIS — M1710 Unilateral primary osteoarthritis, unspecified knee: Secondary | ICD-10-CM

## 2012-11-05 DIAGNOSIS — I2510 Atherosclerotic heart disease of native coronary artery without angina pectoris: Secondary | ICD-10-CM | POA: Insufficient documentation

## 2012-11-05 DIAGNOSIS — I4729 Other ventricular tachycardia: Secondary | ICD-10-CM | POA: Insufficient documentation

## 2012-11-05 LAB — CBC
HCT: 37.3 % (ref 36.0–46.0)
Hemoglobin: 13 g/dL (ref 12.0–15.0)
MCH: 32.3 pg (ref 26.0–34.0)
MCHC: 35.7 g/dL (ref 30.0–36.0)
MCV: 92.6 fL (ref 78.0–100.0)
Platelets: 193 10*3/uL (ref 150–400)
RBC: 4.03 MIL/uL (ref 3.87–5.11)
RDW: 12.8 % (ref 11.5–15.5)
WBC: 7.4 10*3/uL (ref 4.0–10.5)

## 2012-11-05 LAB — COMPREHENSIVE METABOLIC PANEL
AST: 25 U/L (ref 0–37)
Albumin: 3.3 g/dL — ABNORMAL LOW (ref 3.5–5.2)
Calcium: 9.4 mg/dL (ref 8.4–10.5)
Chloride: 101 mEq/L (ref 96–112)
Creatinine, Ser: 0.75 mg/dL (ref 0.50–1.10)
Total Bilirubin: 0.3 mg/dL (ref 0.3–1.2)

## 2012-11-05 LAB — TSH: TSH: 2.911 u[IU]/mL (ref 0.350–4.500)

## 2012-11-05 LAB — POCT I-STAT TROPONIN I: Troponin i, poc: 0 ng/mL (ref 0.00–0.08)

## 2012-11-05 LAB — POCT I-STAT, CHEM 8
Chloride: 104 mEq/L (ref 96–112)
HCT: 39 % (ref 36.0–46.0)
Hemoglobin: 13.3 g/dL (ref 12.0–15.0)
Potassium: 4.4 mEq/L (ref 3.5–5.1)
Sodium: 140 mEq/L (ref 135–145)

## 2012-11-05 LAB — LIPID PANEL
LDL Cholesterol: 102 mg/dL — ABNORMAL HIGH (ref 0–99)
Triglycerides: 125 mg/dL (ref <150)
VLDL: 25 mg/dL (ref 0–40)

## 2012-11-05 LAB — TROPONIN I: Troponin I: <0.3 ng/mL (ref <0.30)

## 2012-11-05 MED ORDER — NITROGLYCERIN 0.4 MG SL SUBL
0.4000 mg | SUBLINGUAL_TABLET | SUBLINGUAL | Status: AC | PRN
  Administered 2012-11-05 (×3): 0.4 mg via SUBLINGUAL
  Filled 2012-11-05: qty 25

## 2012-11-05 MED ORDER — NITROGLYCERIN 0.4 MG SL SUBL: 0.4000 mg | SUBLINGUAL_TABLET | SUBLINGUAL | Status: DC | PRN

## 2012-11-05 MED ORDER — OMEGA-3-ACID ETHYL ESTERS 1 G PO CAPS
1.0000 g | ORAL_CAPSULE | Freq: Two times a day (BID) | ORAL | Status: DC
  Administered 2012-11-05 (×2): 1 g via ORAL
  Filled 2012-11-05 (×2): qty 1

## 2012-11-05 MED ORDER — ASPIRIN 81 MG PO TABS: 81.0000 mg | ORAL_TABLET | Freq: Every day | ORAL | Status: DC

## 2012-11-05 MED ORDER — ASPIRIN EC 81 MG PO TBEC
81.0000 mg | DELAYED_RELEASE_TABLET | Freq: Every day | ORAL | Status: DC
  Administered 2012-11-05: 81 mg via ORAL
  Filled 2012-11-05: qty 1

## 2012-11-05 MED ORDER — SODIUM CHLORIDE 0.9 % IJ SOLN: 3.0000 mL | INTRAMUSCULAR | Status: DC | PRN

## 2012-11-05 MED ORDER — SODIUM CHLORIDE 0.9 % IV SOLN: 250.0000 mL | INTRAVENOUS | Status: DC | PRN

## 2012-11-05 MED ORDER — ACETAMINOPHEN 325 MG PO TABS: 650.0000 mg | ORAL_TABLET | ORAL | Status: DC | PRN

## 2012-11-05 MED ORDER — METOPROLOL SUCCINATE ER 25 MG PO TB24
25.0000 mg | ORAL_TABLET | Freq: Every day | ORAL | Status: DC
  Administered 2012-11-05: 25 mg via ORAL
  Filled 2012-11-05: qty 1

## 2012-11-05 MED ORDER — IOHEXOL 350 MG/ML SOLN
100.0000 mL | Freq: Once | INTRAVENOUS | Status: AC | PRN
  Administered 2012-11-05: 100 mL via INTRAVENOUS

## 2012-11-05 MED ORDER — MORPHINE SULFATE 4 MG/ML IJ SOLN
4.0000 mg | Freq: Once | INTRAMUSCULAR | Status: AC
  Administered 2012-11-05: 4 mg via INTRAVENOUS
  Filled 2012-11-05: qty 1

## 2012-11-05 MED ORDER — SODIUM CHLORIDE 0.9 % IJ SOLN
3.0000 mL | Freq: Two times a day (BID) | INTRAMUSCULAR | Status: DC
  Administered 2012-11-05 (×2): 3 mL via INTRAVENOUS

## 2012-11-05 MED ORDER — ONDANSETRON HCL 4 MG/2ML IJ SOLN: 4.0000 mg | Freq: Four times a day (QID) | INTRAMUSCULAR | Status: DC | PRN

## 2012-11-05 MED ORDER — SIMETHICONE 40 MG/0.6ML PO SUSP
40.0000 mg | Freq: Four times a day (QID) | ORAL | Status: DC | PRN
  Administered 2012-11-05: 40 mg via ORAL
  Filled 2012-11-05: qty 0.6

## 2012-11-05 MED ORDER — LISINOPRIL 20 MG PO TABS
20.0000 mg | ORAL_TABLET | Freq: Every day | ORAL | Status: DC
  Administered 2012-11-05: 20 mg via ORAL
  Filled 2012-11-05 (×2): qty 1

## 2012-11-05 MED ORDER — ONDANSETRON HCL 4 MG/2ML IJ SOLN
4.0000 mg | Freq: Once | INTRAMUSCULAR | Status: AC
  Administered 2012-11-05: 4 mg via INTRAVENOUS
  Filled 2012-11-05: qty 2

## 2012-11-05 NOTE — Progress Notes (Signed)
This patient has received 40 ml's of IV omni 300 and saline (type of contrast) contrast extravasation into *rt ac  (part of body) during a ct angio chest exam.  The exam was performed on (date) 11/05/12  Site / affected area assessed by Dr. Conni Slipper

## 2012-11-05 NOTE — H&P (Signed)
[facility] Attending Admission Note: Denny Levy MD [phone] pager office [phone] I  have seen and examined this patient, reviewed their chart. I have discussed this patient with the resident. I agree with the resident's findings, assessment and care plan. W ewill let her cardiologist, Dr. [name] know of her inpatient status. I am not convinced this is cardiac in etiology---we will hold off on formal consult at this time--get D Dimer.She seems pain free at my exam this morning.

## 2012-11-05 NOTE — Discharge Summary (Signed)
Physician Discharge Summary  Patient ID: Carmen Brown MRN: [id number] DOB: [birth year] Age: 72 y.o.  Admit date: 11/05/2012 Discharge date: 11/05/2012  PCP: Tally Due, MD  Consultants:None, made cardiologist Dr. [name] of [place] aware of admission.      Discharge Diagnosis: Principal Problem:   Chest pressure Active Problems:   OBESITY, NOS   HYPERTENSION, BENIGN SYSTEMIC   RHINITIS, ALLERGIC    Hospital Course 72 yo F with HTN presenting with atypical substernal chest pressure (not exertional, not relieved by rest or nitroglycerin) that was pleuritic in nature with some radiation to right scapula. ACS was ruled out with troponins x2 (risk factors included age and HTN with further risk stratification labs unremarkable except LDL 102). She had a normal cath in 2008. EKG wnl. Patient has a history of SVT in 2009 and had echo at that time showing EF of 35-40 but there were no signs of fluid overload and not treated as CHF as an outpatient.Patient was encouraged to follow up with her cardiologist within a week at time of discharge. Patient is a flight attendant and had pleuritic nature to pain so d-dimer was checked which was elevated. CTA of chest did not reveal pulmonary embolism. Patient's pain was relieved in the AM of admission after morphine but came back later in the day to moderate pressure. Patient took simethicone and gave similar relief to morphine. It was though that chest pressure may be GI in origin. Patient reports had a similar pain several years ago and it was relieved when she had a chiropractor work on her hiatal hernia. She plans to seek out the same care if ok with  Cardiologist when she follows up.  Chronic medical conditions HTN-Continued home Lisinopril and Metoprolol  Primary CVA prevention-continued aspirin   Procedures/Imaging:  Ct Angio Chest Pe W/cm &/or Wo Cm  11/05/2012  *RADIOLOGY REPORT*  Clinical Data: Chest pain.  Elevated D-dimer.  CT  ANGIOGRAPHY CHEST  Technique:  Multidetector CT imaging of the chest using the standard protocol during bolus administration of intravenous contrast. Multiplanar reconstructed images including MIPs were obtained and reviewed to evaluate the vascular anatomy.  Contrast: OMNIPAQUE IOHEXOL 350 MG/ML SOLN  Comparison: Chest CT 03/10/2006.  Findings: The chest wall is unremarkable.  No breast masses, supraclavicular or axillary adenopathy.  Small scattered lymph nodes are noted.  The thyroid gland is normal.  The bony thorax is intact.  No destructive bone lesions or spinal canal compromise.  The heart is upper normal in size.  No pericardial effusion.  No mediastinal or hilar lymphadenopathy or mass.  There is mild tortuosity and ectasia of the thoracic aorta but no focal aneurysm or dissection.  Scattered atherosclerotic calcifications are noted. Coronary artery calcifications are noted.  The esophagus is grossly normal.  The pulmonary arterial tree is fairly well opacified.  No filling defects to suggest pulmonary emboli.  Examination of the lung parenchyma demonstrates no acute pulmonary findings.  No pleural effusion or worrisome pulmonary nodules.  The upper abdomen is unremarkable.  IMPRESSION:  1.  No CT findings for pulmonary embolism. 2.  Mild tortuosity and ectasia of the thoracic aorta but no focal aneurysm. 3.  Scattered coronary artery calcifications. 4.  No acute pulmonary findings.  Approximately 40 ml of contrast extravasated into the patient's upper right forearm.  Localized redness and swelling is appreciated.  Neurovascular exam is intact.  An ice pack was applied and appropriate orders were entered to observe the patient.   Original Report  Authenticated By: Rudie Meyer, M.D.    Dg Chest Portable 1 View  11/05/2012  *RADIOLOGY REPORT*  Clinical Data: Chest pressure.  PORTABLE CHEST - 1 VIEW  Comparison: November 2008  Findings:  Cardiopericardial silhouette within normal limits. Mediastinal  contours normal. Trachea midline.  No airspace disease or effusion. Monitoring leads are projected over the chest.  IMPRESSION: No active cardiopulmonary disease.   Original Report Authenticated By: Andreas Newport, M.D.     Labs  CBC  Recent Labs Lab 11/05/12 0430 11/05/12 0454 11/05/12 1008  WBC 7.4  --  7.1  HGB 13.7 13.3 13.0  HCT 38.4 39.0 37.3  PLT 193  --  201   BMET  Recent Labs Lab 11/05/12 0454 11/05/12 1008  NA 140 137  K 4.4 4.3  CL 104 101  CO2  --  25  BUN 18 17  CREATININE 1.00 0.75  CALCIUM  --  9.4  PROT  --  7.5  BILITOT  --  0.3  ALKPHOS  --  58  ALT  --  26  AST  --  25  GLUCOSE 105* 140*   Results for orders placed during the hospital encounter of 11/05/12 (from the past 72 hour(s))  CBC     Status: None   Collection Time    11/05/12  4:30 AM      Result Value Range   WBC 7.4  4.0 - 10.5 K/uL   RBC 4.07  3.87 - 5.11 MIL/uL   Hemoglobin 13.7  12.0 - 15.0 g/dL   HCT 16.1  09.6 - 04.5 %   MCV 94.3  78.0 - 100.0 fL   MCH 33.7  26.0 - 34.0 pg   MCHC 35.7  30.0 - 36.0 g/dL   RDW 40.9  81.1 - 91.4 %   Platelets 193  150 - 400 K/uL  POCT I-STAT TROPONIN I     Status: None   Collection Time    11/05/12  4:52 AM      Result Value Range   Troponin i, poc 0.00  0.00 - 0.08 ng/mL   Comment 3            Comment: Due to the release kinetics of cTnI,     a negative result within the first hours     of the onset of symptoms does not rule out     myocardial infarction with certainty.     If myocardial infarction is still suspected,     repeat the test at appropriate intervals.  POCT I-STAT, CHEM 8     Status: Abnormal   Collection Time    11/05/12  4:54 AM      Result Value Range   Sodium 140  135 - 145 mEq/L   Potassium 4.4  3.5 - 5.1 mEq/L   Chloride 104  96 - 112 mEq/L   BUN 18  6 - 23 mg/dL   Creatinine, Ser 7.82  0.50 - 1.10 mg/dL   Glucose, Bld 956 (*) 70 - 99 mg/dL   Calcium, Ion 2.13  0.86 - 1.30 mmol/L   TCO2 28  0 - 100 mmol/L    Hemoglobin 13.3  12.0 - 15.0 g/dL   HCT 57.8  46.9 - 62.9 %  TROPONIN I     Status: None   Collection Time    11/05/12 10:08 AM      Result Value Range   Troponin I <0.30  <0.30 ng/mL   Comment:  Due to the release kinetics of cTnI,     a negative result within the first hours     of the onset of symptoms does not rule out     myocardial infarction with certainty.     If myocardial infarction is still suspected,     repeat the test at appropriate intervals.  TSH     Status: None   Collection Time    11/05/12 10:08 AM      Result Value Range   TSH 2.911  0.350 - 4.500 uIU/mL  HEMOGLOBIN A1C     Status: None   Collection Time    11/05/12 10:08 AM      Result Value Range   Hemoglobin A1C 5.4  <5.7 %   Comment: (NOTE)                                                                               According to the ADA Clinical Practice Recommendations for 2011, when     HbA1c is used as a screening test:      >=6.5%   Diagnostic of Diabetes Mellitus               (if abnormal result is confirmed)     5.7-6.4%   Increased risk of developing Diabetes Mellitus     References:Diagnosis and Classification of Diabetes Mellitus,Diabetes     Care,2011,34(Suppl 1):S62-S69 and Standards of Medical Care in             Diabetes - 2011,Diabetes Care,2011,34 (Suppl 1):S11-S61.   Mean Plasma Glucose 108  <117 mg/dL  COMPREHENSIVE METABOLIC PANEL     Status: Abnormal   Collection Time    11/05/12 10:08 AM      Result Value Range   Sodium 137  135 - 145 mEq/L   Potassium 4.3  3.5 - 5.1 mEq/L   Chloride 101  96 - 112 mEq/L   CO2 25  19 - 32 mEq/L   Glucose, Bld 140 (*) 70 - 99 mg/dL   BUN 17  6 - 23 mg/dL   Creatinine, Ser 0.45  0.50 - 1.10 mg/dL   Calcium 9.4  8.4 - 40.9 mg/dL   Total Protein 7.5  6.0 - 8.3 g/dL   Albumin 3.3 (*) 3.5 - 5.2 g/dL   AST 25  0 - 37 U/L   ALT 26  0 - 35 U/L   Alkaline Phosphatase 58  39 - 117 U/L   Total Bilirubin 0.3  0.3 - 1.2 mg/dL   GFR calc non  Af Amer 84 (*) >90 mL/min   GFR calc Af Amer >90  >90 mL/min   Comment:            The eGFR has been calculated     using the CKD EPI equation.     This calculation has not been     validated in all clinical     situations.     eGFR's persistently     <90 mL/min signify     possible Chronic Kidney Disease.  CBC     Status: None   Collection Time    11/05/12 10:08 AM  Result Value Range   WBC 7.1  4.0 - 10.5 K/uL   RBC 4.03  3.87 - 5.11 MIL/uL   Hemoglobin 13.0  12.0 - 15.0 g/dL   HCT 78.4  69.6 - 29.5 %   MCV 92.6  78.0 - 100.0 fL   MCH 32.3  26.0 - 34.0 pg   MCHC 34.9  30.0 - 36.0 g/dL   RDW 28.4  13.2 - 44.0 %   Platelets 201  150 - 400 K/uL  D-DIMER, QUANTITATIVE     Status: Abnormal   Collection Time    11/05/12 10:08 AM      Result Value Range   D-Dimer, Quant 1.09 (*) 0.00 - 0.48 ug/mL-FEU   Comment:            AT THE INHOUSE ESTABLISHED CUTOFF     VALUE OF 0.48 ug/mL FEU,     THIS ASSAY HAS BEEN DOCUMENTED     IN THE LITERATURE TO HAVE     A SENSITIVITY AND NEGATIVE     PREDICTIVE VALUE OF AT LEAST     98 TO 99%.  THE TEST RESULT     SHOULD BE CORRELATED WITH     AN ASSESSMENT OF THE CLINICAL     PROBABILITY OF DVT / VTE.  LIPID PANEL     Status: Abnormal   Collection Time    11/05/12 10:15 AM      Result Value Range   Cholesterol 162  0 - 200 mg/dL   Triglycerides 102  <725 mg/dL   HDL 35 (*) >36 mg/dL   Total CHOL/HDL Ratio 4.6     VLDL 25  0 - 40 mg/dL   LDL Cholesterol 644 (*) 0 - 99 mg/dL   Comment:            Total Cholesterol/HDL:CHD Risk     Coronary Heart Disease Risk Table                         Men   Women      1/2 Average Risk   3.4   3.3      Average Risk       5.0   4.4      2 X Average Risk   9.6   7.1      3 X Average Risk  23.4   11.0                Use the calculated Patient Ratio     above and the CHD Risk Table     to determine the patient's CHD Risk.                ATP III CLASSIFICATION (LDL):      <100     mg/dL    Optimal      034-742  mg/dL   Near or Above                        Optimal      130-159  mg/dL   Borderline      595-638  mg/dL   High      >756     mg/dL   Very High  TROPONIN I     Status: None   Collection Time    11/05/12  2:00 PM      Result Value Range   Troponin I <0.30  <0.30 ng/mL   Comment:  Due to the release kinetics of cTnI,     a negative result within the first hours     of the onset of symptoms does not rule out     myocardial infarction with certainty.     If myocardial infarction is still suspected,     repeat the test at appropriate intervals.  TROPONIN I     Status: None   Collection Time    11/05/12  8:41 PM      Result Value Range   Troponin I <0.30  <0.30 ng/mL   Comment:            Due to the release kinetics of cTnI,     a negative result within the first hours     of the onset of symptoms does not rule out     myocardial infarction with certainty.     If myocardial infarction is still suspected,     repeat the test at appropriate intervals.       Patient condition at time of discharge/disposition: stable  Disposition-home   Follow up issues: 1. Consider starting a statin for LDL of 102 although patient appears to attempt other therapies.  2. Please ensure pateitn follows up with cardiology with consideration of outpatient stress test.  3. Follow up chest pain-most likely GI related given similar improvement with simethicone as compared to morphine but no relief with nitroglycerin.  4. Follow up area on arm with slight contrast extravasation from CTA.   Discharge follow up:  Follow-up Information   Follow up with [name], Loretha Stapler, MD. Schedule an appointment as soon as possible for a visit in 1 week.   Contact information:   [address] [place] [address] [phone]       Follow up with [facility] Schedule an appointment as soon as possible for a visit in 3 days. (for follow up of chest pain)    Contact  information:   [address] [place] [address]      Discharge Orders   Future Orders Complete By Expires     Call MD for:  persistant nausea and vomiting  As directed     Call MD for:  severe uncontrolled pain  As directed     Call MD for:  temperature >100.4  As directed     Diet - low sodium heart healthy  As directed     Increase activity slowly  As directed        Discharge Medications   Medication List    TAKE these medications       aspirin 81 MG tablet  Take 81 mg by mouth daily.     Azelastine HCl 0.15 % Soln  Place one spray in each nostril one time daily     CO Q 10 PO  Take 1 tablet by mouth daily.     glucosamine-chondroitin 500-400 MG tablet  Take 1 tablet by mouth 2 (two) times daily.     lisinopril 20 MG tablet  Commonly known as:  PRINIVIL,ZESTRIL  Take 20 mg by mouth daily.     metoprolol succinate 25 MG 24 hr tablet  Commonly known as:  TOPROL-XL  Take 1 tablet (25 mg total) by mouth daily.     multivitamin capsule  Take 1 capsule by mouth 2 (two) times daily.     nitroGLYCERIN 0.4 MG SL tablet  Commonly known as:  NITROSTAT  Place 1 tablet (0.4 mg total) under the  tongue every 5 (five) minutes x 3 doses as needed for chest pain (call 911 if no relief after 3).     omega-3 acid ethyl esters 1 G capsule  Commonly known as:  LOVAZA  Take 1 g by mouth 2 (two) times daily.     Vitamin D3 2000 UNITS Tabs  Take 1 tablet by mouth daily.           Tana Conch, MD of [facility] 11/05/2012 10:32 PM

## 2012-11-05 NOTE — ED Provider Notes (Signed)
History     CSN: [id number]  Arrival date & time 11/05/12  0410   First MD Initiated Contact with Patient 11/05/12 0424      No chief complaint on file.   (Consider location/radiation/quality/duration/timing/severity/associated sxs/prior treatment) HPI Hx per PT - L sided CP onset around 4am after waking up and in the shower - mild to moderate in severity, pressure like pain. No associated arm/ jaw pain. No N/V. No cough or SOB. PT has strong FH of CAD.  No known alleviating factors. Pain persistent since onset. Somewhat worse with deep inspiration, no leg pain or swelling.   Past Medical History  Diagnosis Date  . Hypertension   . Paroxysmal VT   . Thyroid disease   . Cardiomyopathy   . Allergy   . Arthritis   . Heart murmur     Past Surgical History  Procedure Laterality Date  . Ventricular tacticartia    . Mass excision  03/15/2012    Procedure: MINOR EXCISION OF MASS;  Surgeon: Wyn Forster., MD;  Location: [facility];  Service: Orthopedics;  Laterality: Right;  debride IP right long finger, excision mucoid cyst    Family History  Problem Relation Age of Onset  . Heart disease Mother   . Heart disease Father   . Stroke Maternal Grandmother   . Diabetes Maternal Grandfather     History  Substance Use Topics  . Smoking status: Never Smoker   . Smokeless tobacco: Not on file  . Alcohol Use: Not on file    OB History   Grav Para Term Preterm Abortions TAB SAB Ect Mult Living                  Review of Systems  Constitutional: Negative for fever and chills.  HENT: Negative for neck pain and neck stiffness.   Eyes: Negative for pain.  Respiratory: Negative for shortness of breath.   Cardiovascular: Positive for chest pain.  Gastrointestinal: Negative for abdominal pain.  Genitourinary: Negative for dysuria.  Musculoskeletal: Negative for back pain.  Skin: Negative for rash.  Neurological: Negative for headaches.  All other systems  reviewed and are negative.    Allergies  Epinephrine  Home Medications   Current Outpatient Rx  Name  Route  Sig  Dispense  Refill  . amoxicillin (AMOXIL) 875 MG tablet      Take twice daily for sinus or ear or lymph gland infection. Very this prescription with you to begin it when you're traveling if needed.   20 tablet   0   . aspirin 81 MG tablet   Oral   Take 81 mg by mouth daily.           . Azelastine HCl 0.15 % SOLN      Place one spray in each nostril one time daily   30 mL   0   . Cholecalciferol (VITAMIN D3) 2000 UNIT TABS   Oral   Take by mouth daily.           Marland Kitchen EXPIRED: fluticasone (FLONASE) 50 MCG/ACT nasal spray   Nasal   Place 2 sprays into the nose daily.   16 g   5   . Glucosamine Sulfate POWD   Oral   Take by mouth 2 (two) times daily.           Marland Kitchen lisinopril (PRINIVIL,ZESTRIL) 20 MG tablet      TAKE 1 TABLET BY MOUTH EVERY DAY   90  tablet   0     Needs office visit   . metoprolol succinate (TOPROL-XL) 25 MG 24 hr tablet   Oral   Take 1 tablet (25 mg total) by mouth daily.   90 tablet   3   . Multiple Vitamin (MULTIVITAMIN) capsule   Oral   Take 1 capsule by mouth 2 (two) times daily.             BP 134/92  Pulse 74  Temp(Src) 99 F (37.2 C) (Oral)  Resp 16  SpO2 100%  Physical Exam  Constitutional: She is oriented to person, place, and time. She appears well-developed and well-nourished.  HENT:  Head: Normocephalic and atraumatic.  Eyes: Conjunctivae and EOM are normal. Pupils are equal, round, and reactive to light.  Neck: Trachea normal. Neck supple. No thyromegaly present.  Cardiovascular: Normal rate, regular rhythm, S1 normal, S2 normal and normal pulses.     No systolic murmur is present   No diastolic murmur is present  Pulses:      Radial pulses are 2+ on the right side, and 2+ on the left side.  Pulmonary/Chest: Effort normal and breath sounds normal. She has no wheezes. She has no rhonchi. She has no  rales.  No reproducible tenderness  Abdominal: Soft. Normal appearance and bowel sounds are normal. There is no tenderness. There is no CVA tenderness and negative Murphy's sign.  Musculoskeletal:  calves nontender, no cords or erythema  Neurological: She is alert and oriented to person, place, and time. She has normal strength. No cranial nerve deficit or sensory deficit. GCS eye subscore is 4. GCS verbal subscore is 5. GCS motor subscore is 6.  Skin: Skin is warm and dry. No rash noted. She is not diaphoretic.  Psychiatric: Her speech is normal.    ED Course  Procedures (including critical care time)  Results for orders placed during the hospital encounter of 11/05/12  CBC      Result Value Range   WBC 7.4  4.0 - 10.5 K/uL   RBC 4.07  3.87 - 5.11 MIL/uL   Hemoglobin 13.7  12.0 - 15.0 g/dL   HCT 16.1  09.6 - 04.5 %   MCV 94.3  78.0 - 100.0 fL   MCH 33.7  26.0 - 34.0 pg   MCHC 35.7  30.0 - 36.0 g/dL   RDW 40.9  81.1 - 91.4 %   Platelets 193  150 - 400 K/uL  POCT I-STAT, CHEM 8      Result Value Range   Sodium 140  135 - 145 mEq/L   Potassium 4.4  3.5 - 5.1 mEq/L   Chloride 104  96 - 112 mEq/L   BUN 18  6 - 23 mg/dL   Creatinine, Ser 7.82  0.50 - 1.10 mg/dL   Glucose, Bld 956 (*) 70 - 99 mg/dL   Calcium, Ion 2.13  0.86 - 1.30 mmol/L   TCO2 28  0 - 100 mmol/L   Hemoglobin 13.3  12.0 - 15.0 g/dL   HCT 57.8  46.9 - 62.9 %  POCT I-STAT TROPONIN I      Result Value Range   Troponin i, poc 0.00  0.00 - 0.08 ng/mL   Comment 3            Dg Chest Portable 1 View  11/05/2012  *RADIOLOGY REPORT*  Clinical Data: Chest pressure.  PORTABLE CHEST - 1 VIEW  Comparison: November 2008  Findings:  Cardiopericardial silhouette within normal limits. Mediastinal  contours normal. Trachea midline.  No airspace disease or effusion. Monitoring leads are projected over the chest.  IMPRESSION: No active cardiopulmonary disease.   Original Report Authenticated By: Andreas Newport, M.D.     ASA PTA   NTG Morphine IV  Med consult for admit   Date: 11/05/2012  Rate: 78  Rhythm: normal sinus rhythm  QRS Axis: normal  Intervals: normal  ST/T Wave abnormalities: nonspecific ST changes  Conduction Disutrbances:none  Narrative Interpretation:   Old EKG Reviewed: unchanged  5:20 AM  - d/w FPM - plan Admit  MDM  CP 72 yo female evaluated with ECG, CXR and labs  ASA PTA, treated with NTG and IV narcotics  MED admit        Sunnie Nielsen, MD 11/05/12 [phone]

## 2012-11-05 NOTE — Progress Notes (Signed)
Patient given discharge instructions. No questions at this time. Discharged home. Will continue to monitor.

## 2012-11-05 NOTE — H&P (Signed)
H&P  Note Family Medicine Teaching Service Carmen Seib M. Jarius Dieudonne, MD Service Pager: [phone]  Carmen Brown is an 72 y.o. female.   Chief Complaint: chest pressure HPI: Pt is a 72 yo F with PMH of HTN presenting to the ED with complaint of substernal chest pressure for the last 3 hours. States she woke up around 4:00am for work and started having pressure soon afterwards. It was associated with brief nausea and clamminess. She took 3 aspirin and came to the ED for evaluation. She states her pain has remained constant. It radiates to her right shoulder blade. Nothing makes it better or worse. Does not notice increased pain with walking/exertion. More noticeable with deep breath but not "worse."   She states she has been ill recently with a GI bug (abd pain, diarrhea) but no other recent illnesses. She works as a Financial controller, but she has been on vacation and hasn't flown in over 2 weeks. She has been seen by Dr. [name] at [facility] in the past for paroxysmal SVT. She had a clean cardiac cath 4 years ago. She has history of HTN and her sister has had cardiac stenting. She denies diabetes, smoking, high cholesterol.  ED Course: CXR, iStat troponin and EKG wnl. Currently receiving nitro for pain, which has not relieved it yet. Will admit for chest pain rule out.  Past Medical History  Diagnosis Date  . Hypertension   . Paroxysmal VT   . Thyroid disease   . Cardiomyopathy   . Allergy   . Arthritis   . Heart murmur     Past Surgical History  Procedure Laterality Date  . Ventricular tacticartia    . Mass excision  03/15/2012    Procedure: MINOR EXCISION OF MASS;  Surgeon: Wyn Forster., MD;  Location: [facility];  Service: Orthopedics;  Laterality: Right;  debride IP right long finger, excision mucoid cyst    Family History  Problem Relation Age of Onset  . Heart disease Mother   . Heart disease Father   . Stroke Maternal Grandmother   . Diabetes Maternal  Grandfather    Social History:  reports that she has never smoked. She does not have any smokeless tobacco history on file. Her alcohol and drug histories are not on file.  Allergies:  Allergies  Allergen Reactions  . Epinephrine Other (See Comments)    unknown     (Not in a hospital admission)  Results for orders placed during the hospital encounter of 11/05/12 (from the past 48 hour(s))  CBC     Status: None   Collection Time    11/05/12  4:30 AM      Result Value Range   WBC 7.4  4.0 - 10.5 K/uL   RBC 4.07  3.87 - 5.11 MIL/uL   Hemoglobin 13.7  12.0 - 15.0 g/dL   HCT 45.4  09.8 - 11.9 %   MCV 94.3  78.0 - 100.0 fL   MCH 33.7  26.0 - 34.0 pg   MCHC 35.7  30.0 - 36.0 g/dL   RDW 14.7  82.9 - 56.2 %   Platelets 193  150 - 400 K/uL  POCT I-STAT TROPONIN I     Status: None   Collection Time    11/05/12  4:52 AM      Result Value Range   Troponin i, poc 0.00  0.00 - 0.08 ng/mL   Comment 3  Comment: Due to the release kinetics of cTnI,     a negative result within the first hours     of the onset of symptoms does not rule out     myocardial infarction with certainty.     If myocardial infarction is still suspected,     repeat the test at appropriate intervals.  POCT I-STAT, CHEM 8     Status: Abnormal   Collection Time    11/05/12  4:54 AM      Result Value Range   Sodium 140  135 - 145 mEq/L   Potassium 4.4  3.5 - 5.1 mEq/L   Chloride 104  96 - 112 mEq/L   BUN 18  6 - 23 mg/dL   Creatinine, Ser 2.44  0.50 - 1.10 mg/dL   Glucose, Bld 010 (*) 70 - 99 mg/dL   Calcium, Ion 2.72  5.36 - 1.30 mmol/L   TCO2 28  0 - 100 mmol/L   Hemoglobin 13.3  12.0 - 15.0 g/dL   HCT 64.4  03.4 - 74.2 %   Dg Chest Portable 1 View  11/05/2012  *RADIOLOGY REPORT*  Clinical Data: Chest pressure.  PORTABLE CHEST - 1 VIEW  Comparison: November 2008  Findings:  Cardiopericardial silhouette within normal limits. Mediastinal contours normal. Trachea midline.  No airspace disease or  effusion. Monitoring leads are projected over the chest.  IMPRESSION: No active cardiopulmonary disease.   Original Report Authenticated By: Andreas Brown, M.D.     ROS Denies HA, changes in vision, sore throat, SOB, abd pain, dysuria, edema or rash Endorses chest pressure, intermittent nausea  Blood pressure 114/78, pulse 87, temperature 99 F (37.2 C), temperature source Oral, resp. rate 23, SpO2 98.00%. Physical Exam  General: Sitting up in bed, appears younger than stated age. NAD HEENT: AT, [place]. MMM Chest: RRR, no murmur appreciated. Pressure increased with palpation of sternum and epigastrum Pulm: Good effort, CTAB Abd: Soft, non-tender. ?distended Extremities: No edema. Moves all extremities Neuro: Grossly intact  Assessment/Plan 72 yo F with HTN presenting with chest pressure  # ACS rule out- Patient with risk factors of age, sex, obesity, HTN, and likely family history. DDx includes pleuritic pain, GI pain given recent illness or gallbladder given the radiation to right scapula.  Cath in 2008 was normal. - Admit to telemetry, attending Dr. Denny Levy - Cycle troponin - Recheck EKG this afternoon - TSH - A1C - Lipid profile - Repeat CBC and Cmet - Monitor for now. She is followed by Dr. [name] at [facility] and would have a low threshold for calling him with any concerns - Last echo in 2009 was during her SVT episode and showed EF of 35-40%. No signs of fluid overload and not treated as CHF as an outpatient. Consider repeat echo this hospitalization  # HTN - Stable - Continue home Lisinopril and Metoprolol  # Allergic rhinitis- - Will hold home nasal spray for now, but restart hospital formulary if requested  FEN/GI: Heart healthy diet PPx: SCD and ambulation Dispo: Pending further work up.  Carmen Brown 11/05/2012, 6:39 AM

## 2012-11-05 NOTE — ED Notes (Signed)
Pt coming from home with c/o of chest pressure. Pt states she broke out into a sweat and felt a little nausea when onset of chest pressure occurred. Pressure increases with deep inspiration. 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 Skin is warm and dry, respirations equal and unlabored, pt is A&Ox4.

## 2012-11-06 ENCOUNTER — Other Ambulatory Visit: Payer: Self-pay | Admitting: Physician Assistant

## 2012-11-07 ENCOUNTER — Other Ambulatory Visit: Payer: Self-pay

## 2012-11-07 MED ORDER — FLUTICASONE PROPIONATE 50 MCG/ACT NA SUSP: 2.0000 | Freq: Every day | NASAL | Status: DC

## 2012-11-07 NOTE — Discharge Summary (Signed)
Family Medicine Teaching Service  Discharge Note : Attending Deliliah Spranger MD Pager [phone] Office [phone] I have seen and examined this patient, reviewed their chart and discussed discharge planning wit the resident at the time of discharge. I agree with the discharge plan as above.  

## 2013-02-06 ENCOUNTER — Other Ambulatory Visit: Payer: Self-pay | Admitting: Internal Medicine

## 2013-02-06 ENCOUNTER — Other Ambulatory Visit: Payer: Self-pay | Admitting: Family Medicine

## 2013-03-14 ENCOUNTER — Ambulatory Visit (INDEPENDENT_AMBULATORY_CARE_PROVIDER_SITE_OTHER): Payer: BC Managed Care – PPO | Admitting: Internal Medicine

## 2013-03-14 VITALS — BP 118/74 | HR 90 | Temp 98.0°F | Resp 17 | Ht 65.0 in | Wt 179.0 lb

## 2013-03-14 DIAGNOSIS — Z7189 Other specified counseling: Secondary | ICD-10-CM

## 2013-03-14 DIAGNOSIS — I10 Essential (primary) hypertension: Secondary | ICD-10-CM

## 2013-03-14 DIAGNOSIS — I472 Ventricular tachycardia: Secondary | ICD-10-CM

## 2013-03-14 DIAGNOSIS — E039 Hypothyroidism, unspecified: Secondary | ICD-10-CM

## 2013-03-14 LAB — BASIC METABOLIC PANEL
CO2: 25 mEq/L (ref 19–32)
Calcium: 9.9 mg/dL (ref 8.4–10.5)
Chloride: 104 mEq/L (ref 96–112)
Creat: 1.04 mg/dL (ref 0.50–1.10)
Glucose, Bld: 100 mg/dL — ABNORMAL HIGH (ref 70–99)

## 2013-03-14 MED ORDER — METOPROLOL SUCCINATE ER 25 MG PO TB24: 25.0000 mg | ORAL_TABLET | Freq: Every day | ORAL | Status: DC

## 2013-03-14 MED ORDER — LISINOPRIL 20 MG PO TABS: 20.0000 mg | ORAL_TABLET | Freq: Every day | ORAL | Status: DC

## 2013-03-14 NOTE — Patient Instructions (Signed)
Hypertension  As your heart beats, it forces blood through your arteries. This force is your blood pressure. If the pressure is too high, it is called hypertension (HTN) or high blood pressure. HTN is dangerous because you may have it and not know it. High blood pressure may mean that your heart has to work harder to pump blood. Your arteries may be narrow or stiff. The extra work puts you at risk for heart disease, stroke, and other problems.   Blood pressure consists of two numbers, a higher number over a lower, 110/72, for example. It is stated as "110 over 72." The ideal is below 120 for the top number (systolic) and under 80 for the bottom (diastolic). Write down your blood pressure today.  You should pay close attention to your blood pressure if you have certain conditions such as:   Heart failure.   Prior heart attack.   Diabetes   Chronic kidney disease.   Prior stroke.   Multiple risk factors for heart disease.  To see if you have HTN, your blood pressure should be measured while you are seated with your arm held at the level of the heart. It should be measured at least twice. A one-time elevated blood pressure reading (especially in the Emergency Department) does not mean that you need treatment. There may be conditions in which the blood pressure is different between your right and left arms. It is important to see your caregiver soon for a recheck.  Most people have essential hypertension which means that there is not a specific cause. This type of high blood pressure may be lowered by changing lifestyle factors such as:   Stress.   Smoking.   Lack of exercise.   Excessive weight.   Drug/tobacco/alcohol use.   Eating less salt.  Most people do not have symptoms from high blood pressure until it has caused damage to the body. Effective treatment can often prevent, delay or reduce that damage.  TREATMENT    When a cause has been identified, treatment for high blood pressure is directed at the cause. There are a large number of medications to treat HTN. These fall into several categories, and your caregiver will help you select the medicines that are best for you. Medications may have side effects. You should review side effects with your caregiver.  If your blood pressure stays high after you have made lifestyle changes or started on medicines,    Your medication(s) may need to be changed.   Other problems may need to be addressed.   Be certain you understand your prescriptions, and know how and when to take your medicine.   Be sure to follow up with your caregiver within the time frame advised (usually within two weeks) to have your blood pressure rechecked and to review your medications.   If you are taking more than one medicine to lower your blood pressure, make sure you know how and at what times they should be taken. Taking two medicines at the same time can result in blood pressure that is too low.  SEEK IMMEDIATE MEDICAL CARE IF:   You develop a severe headache, blurred or changing vision, or confusion.   You have unusual weakness or numbness, or a faint feeling.   You have severe chest or abdominal pain, vomiting, or breathing problems.  MAKE SURE YOU:    Understand these instructions.   Will watch your condition.   Will get help right away if you are not doing well   or get worse.  Document Released: 08/01/2005 Document Revised: 10/24/2011 Document Reviewed: 03/21/2008  ExitCare Patient Information 2014 [employer]  DASH Diet   The DASH diet stands for "Dietary Approaches to Stop Hypertension." It is a healthy eating plan that has been shown to reduce high blood pressure (hypertension) in as little as 14 days, while also possibly providing other significant health benefits. These other health benefits include reducing the risk of breast cancer after menopause and reducing the risk of type 2 diabetes, heart disease, colon cancer, and stroke. Health benefits also include weight loss and slowing kidney failure in patients with chronic kidney disease.   DIET GUIDELINES   Limit salt (sodium). Your diet should contain less than 1500 mg of sodium daily.   Limit refined or processed carbohydrates. Your diet should include mostly whole grains. Desserts and added sugars should be used sparingly.   Include small amounts of heart-healthy fats. These types of fats include nuts, oils, and tub margarine. Limit saturated and trans fats. These fats have been shown to be harmful in the body.  CHOOSING FOODS   The following food groups are based on a 2000 calorie diet. See your Registered Dietitian for individual calorie needs.  Grains and Grain Products (6 to 8 servings daily)   Eat More Often: Whole-wheat bread, brown rice, whole-grain or wheat pasta, quinoa, popcorn without added fat or salt (air popped).   Eat Less Often: White bread, white pasta, white rice, cornbread.  Vegetables (4 to 5 servings daily)   Eat More Often: Fresh, frozen, and canned vegetables. Vegetables may be raw, steamed, roasted, or grilled with a minimal amount of fat.   Eat Less Often/Avoid: Creamed or fried vegetables. Vegetables in a cheese sauce.  Fruit (4 to 5 servings daily)   Eat More Often: All fresh, canned (in natural juice), or frozen fruits. Dried fruits without added sugar. One hundred percent fruit juice ( cup [237 mL] daily).   Eat Less Often: Dried fruits with added sugar. Canned fruit in light or heavy syrup.   Lean Meats, Fish, and Poultry (2 servings or less daily. One serving is 3 to 4 oz [85-114 g]).   Eat More Often: Ninety percent or leaner ground beef, tenderloin, sirloin. Round cuts of beef, chicken breast, turkey breast. All fish. Grill, bake, or broil your meat. Nothing should be fried.   Eat Less Often/Avoid: Fatty cuts of meat, turkey, or chicken leg, thigh, or wing. Fried cuts of meat or fish.  Dairy (2 to 3 servings)   Eat More Often: Low-fat or fat-free milk, low-fat plain or light yogurt, reduced-fat or part-skim cheese.   Eat Less Often/Avoid: Milk (whole, 2%).Whole milk yogurt. Full-fat cheeses.  Nuts, Seeds, and Legumes (4 to 5 servings per week)   Eat More Often: All without added salt.   Eat Less Often/Avoid: Salted nuts and seeds, canned beans with added salt.  Fats and Sweets (limited)   Eat More Often: Vegetable oils, tub margarines without trans fats, sugar-free gelatin. Mayonnaise and salad dressings.   Eat Less Often/Avoid: Coconut oils, palm oils, butter, stick margarine, cream, half and half, cookies, candy, pie.  FOR MORE INFORMATION  The Dash Diet Eating Plan: www.dashdiet.org  Document Released: 07/21/2011 Document Revised: 10/24/2011 Document Reviewed: 07/21/2011  Exit[employer] Patient Information 2014 [employer].

## 2013-03-14 NOTE — Progress Notes (Signed)
  Subjective:    Patient ID: Carmen Brown, female    DOB: February 18, 1941, 72 y.o.   MRN: [id number]  HPI Doing well.HTN and hypothyroid controlled. She had a neg cardiac w/up for chest pain 3/14. Epic reviewed. Has lost 13lbs on new diet, BP best ever.   Review of Systems stable    Objective:   Physical Exam  Constitutional: She is oriented to person, place, and time. She appears well-developed and well-nourished.  HENT:  Nose: Nose normal.  Eyes: EOM are normal.  Neck: Normal range of motion.  Cardiovascular: Normal rate, regular rhythm and normal heart sounds.   Pulmonary/Chest: Effort normal and breath sounds normal.  Musculoskeletal: Normal range of motion.  Neurological: She is alert and oriented to person, place, and time. She exhibits normal muscle tone. Coordination normal.  Psychiatric: She has a normal mood and affect.    Not hypothyroid and not on thyroid meds.      Assessment & Plan:  Bmet/TSH Refill meds 1 yr Schedule cpe 6 mos

## 2013-09-09 ENCOUNTER — Ambulatory Visit: Payer: BC Managed Care – PPO

## 2013-09-09 ENCOUNTER — Ambulatory Visit (INDEPENDENT_AMBULATORY_CARE_PROVIDER_SITE_OTHER): Payer: BC Managed Care – PPO | Admitting: Family Medicine

## 2013-09-09 VITALS — BP 142/92 | HR 70 | Temp 98.3°F | Resp 16 | Ht 64.5 in | Wt 184.4 lb

## 2013-09-09 DIAGNOSIS — R05 Cough: Secondary | ICD-10-CM

## 2013-09-09 DIAGNOSIS — J3489 Other specified disorders of nose and nasal sinuses: Secondary | ICD-10-CM

## 2013-09-09 DIAGNOSIS — R059 Cough, unspecified: Secondary | ICD-10-CM

## 2013-09-09 DIAGNOSIS — R197 Diarrhea, unspecified: Secondary | ICD-10-CM

## 2013-09-09 DIAGNOSIS — R0789 Other chest pain: Secondary | ICD-10-CM

## 2013-09-09 LAB — POCT CBC
Granulocyte percent: 58.5 %G (ref 37–80)
HEMATOCRIT: 47.9 % (ref 37.7–47.9)
Hemoglobin: 15 g/dL (ref 12.2–16.2)
Lymph, poc: 2.4 (ref 0.6–3.4)
MCH: 32.4 pg — AB (ref 27–31.2)
MCHC: 31.3 g/dL — AB (ref 31.8–35.4)
MCV: 103.4 fL — AB (ref 80–97)
MID (CBC): 0.6 (ref 0–0.9)
MPV: 11.1 fL (ref 0–99.8)
PLATELET COUNT, POC: 198 10*3/uL (ref 142–424)
POC Granulocyte: 4.3 (ref 2–6.9)
POC LYMPH %: 33 % (ref 10–50)
POC MID %: 8.5 %M (ref 0–12)
RBC: 4.63 M/uL (ref 4.04–5.48)
RDW, POC: 13.6 %
WBC: 7.4 10*3/uL (ref 4.6–10.2)

## 2013-09-09 LAB — POCT INFLUENZA A/B
Influenza A, POC: NEGATIVE
Influenza B, POC: NEGATIVE

## 2013-09-09 MED ORDER — DOXYCYCLINE HYCLATE 100 MG PO CAPS: 100.0000 mg | ORAL_CAPSULE | Freq: Two times a day (BID) | ORAL | Status: DC

## 2013-09-09 MED ORDER — BENZONATATE 100 MG PO CAPS: 100.0000 mg | ORAL_CAPSULE | Freq: Three times a day (TID) | ORAL | Status: DC | PRN

## 2013-09-09 NOTE — Progress Notes (Signed)
[facility] [address], [place] [address] [id number]  Date:  09/09/2013   Name:  Carmen Brown   DOB:  06-22-1941   MRN:  [id number]  PCP:  Tally Due, MD    Chief Complaint: Nasal Congestion, Fatigue and Cough   History of Present Illness:  Carmen Brown is a 73 y.o. very pleasant female patient who presents with the following:  Here with illness today.  She noted onset of sx about one week ago with cough, fatigue, subjective fever.  She has not noted body aches or chills.   She has a stuffy and runny nose, her right sided sinuses are painful and congested.  Cough is dry.   She has noted some diarrhea off and on for about 3 days- not a lot, just mild.  No blood.  She might go 3 or 4 times a day- no vomiting.  No abdominal pain. She started to feel like she was getting better, but yesterday she helped to move some furniture and felt worse again.  "I over did it."   No known sick contacts.  She did have v tach a few years ago; she was able to wean off amiodarone and has not had any more issues with this problem.  She had seen Dr. [name] in the past and would like to see him again if necessary.   Her sister recently needed a cardiac stent, and her mother died of an MI at approx age 84.  Besides the V Carmen Brown has never had any cardiac problems.  She has never been a smoker.   Patient Active Problem List   Diagnosis Date Noted  . Chest pressure 11/05/2012  . PAROXYSMAL VENTRICULAR TACHYCARDIA 08/21/2009  . OBESITY, NOS 10/12/2006  . HYPERTENSION, BENIGN SYSTEMIC 10/12/2006  . CARDIOMEGALY 10/12/2006  . RHINITIS, ALLERGIC 10/12/2006  . MENOPAUSAL SYNDROME 10/12/2006  . OSTEOARTHRITIS, LOWER LEG 10/12/2006  . CERVICAL SPINE DISORDER, NOS 10/12/2006    Past Medical History  Diagnosis Date  . Hypertension   . Paroxysmal VT   . Thyroid disease   . Cardiomyopathy   . Allergy   . Arthritis   . Heart murmur     Past Surgical History   Procedure Laterality Date  . Ventricular tacticartia    . Mass excision  03/15/2012    Procedure: MINOR EXCISION OF MASS;  Surgeon: Wyn Forster., MD;  Location: [facility];  Service: Orthopedics;  Laterality: Right;  debride IP right long finger, excision mucoid cyst    History  Substance Use Topics  . Smoking status: Never Smoker   . Smokeless tobacco: Never Used  . Alcohol Use: Yes     Comment: occasional    Family History  Problem Relation Age of Onset  . Heart disease Mother   . Heart disease Father   . Stroke Maternal Grandmother   . Diabetes Maternal Grandfather     Allergies  Allergen Reactions  . Epinephrine Other (See Comments)    unknown  . Latex     rash    Medication list has been reviewed and updated.  Current Outpatient Prescriptions on File Prior to Visit  Medication Sig Dispense Refill  . aspirin 81 MG tablet Take 81 mg by mouth daily.        . Azelastine HCl 0.15 % SOLN Place one spray in each nostril one time daily  30 mL  0  . Cholecalciferol (VITAMIN D3) 2000 UNIT  TABS Take 1 tablet by mouth daily.       . Coenzyme Q10 (CO Q 10 PO) Take 1 tablet by mouth daily.      . fluticasone (FLONASE) 50 MCG/ACT nasal spray Place 2 sprays into the nose daily.  16 g  3  . glucosamine-chondroitin 500-400 MG tablet Take 1 tablet by mouth 2 (two) times daily.      Marland Kitchen. lisinopril (PRINIVIL,ZESTRIL) 20 MG tablet Take 1 tablet (20 mg total) by mouth daily. PATIENT NEEDS OFFICE VISIT FOR ADDITIONAL REFILLS  30 tablet  0  . lisinopril (PRINIVIL,ZESTRIL) 20 MG tablet Take 1 tablet (20 mg total) by mouth daily.  90 tablet  3  . metoprolol succinate (TOPROL-XL) 25 MG 24 hr tablet Take 1 tablet (25 mg total) by mouth daily. PATIENT NEEDS OFFICE VISIT FOR ADDITIONAL REFILLS  90 tablet  3  . Multiple Vitamin (MULTIVITAMIN) capsule Take 1 capsule by mouth 2 (two) times daily.        Marland Kitchen. omega-3 acid ethyl esters (LOVAZA) 1 G capsule Take 1 g by mouth 2 (two)  times daily.      . nitroGLYCERIN (NITROSTAT) 0.4 MG SL tablet Place 1 tablet (0.4 mg total) under the tongue every 5 (five) minutes x 3 doses as needed for chest pain (call 911 if no relief after 3).  30 tablet  0   No current facility-administered medications on file prior to visit.    Review of Systems:  As per HPI- otherwise negative. No SOB She does have a hiatal hernia; she has had this for several years.   On further questioning she does admit to some "chest pressure" for the last 4 days.  It started after she ate some soup that contained vinegar; better after she took an antacid.  She thinks it may be GERD.  It is not severe and not present now.  She does NOT note any pain with exertion, but may note a vague pain now and then, sometimes associated with moving her arms or coughing.    Physical Examination: Filed Vitals:   09/09/13 1517  BP: 142/92  Pulse: 70  Temp: 98.3 F (36.8 C)  Resp: 16   Filed Vitals:   09/09/13 1517  Height: 5' 4.5" (1.638 m)  Weight: 184 lb 6.4 oz (83.643 kg)   Body mass index is 31.17 kg/(m^2). Ideal Body Weight: Weight in (lb) to have BMI = 25: 147.6  GEN: WDWN, NAD, Non-toxic, A & O x 3, looks well, overweight.   HEENT: Atraumatic, Normocephalic. Neck supple. No masses, No LAD.  Bilateral TM wnl, oropharynx normal.  PEERL,EOMI.   Ears and Nose: No external deformity. CV: RRR, No M/G/R. No JVD. No thrill. No extra heart sounds. PULM: CTA B, no wheezes, crackles, rhonchi. No retractions. No resp. distress. No accessory muscle use. ABD: S, NT, +BS. No rebound. No HSM.  I am somewhat able to reproduce her pain by pressing on her epigastrium and chest wall EXTR: No c/c/e NEURO Normal gait.  PSYCH: Normally interactive. Conversant. Not depressed or anxious appearing.  Calm demeanor.   Results for orders placed in visit on 09/09/13  POCT CBC      Result Value Range   WBC 7.4  4.6 - 10.2 K/uL   Lymph, poc 2.4  0.6 - 3.4   POC LYMPH PERCENT 33.0   10 - 50 %L   MID (cbc) 0.6  0 - 0.9   POC MID % 8.5  0 - 12 %M  POC Granulocyte 4.3  2 - 6.9   Granulocyte percent 58.5  37 - 80 %G   RBC 4.63  4.04 - 5.48 M/uL   Hemoglobin 15.0  12.2 - 16.2 g/dL   HCT, POC 40.9  81.1 - 47.9 %   MCV 103.4 (*) 80 - 97 fL   MCH, POC 32.4 (*) 27 - 31.2 pg   MCHC 31.3 (*) 31.8 - 35.4 g/dL   RDW, POC 91.4     Platelet Count, POC 198  142 - 424 K/uL   MPV 11.1  0 - 99.8 fL  POCT INFLUENZA A/B      Result Value Range   Influenza A, POC Negative     Influenza B, POC Negative     [facility] reading (PRIMARY) by  Dr. Patsy Lager. CXR: negative  CHEST - 2 VIEW  COMPARISON: None  FINDINGS: The heart size is at the upper lungs are normal. There is no evidence of pulmonary edema, consolidation, pneumothorax, nodule or pleural fluid. The thoracic spine shows mild degenerative changes.  IMPRESSION: No active disease.  EKG: SR, no St elevation or depression.  She does have decreased voltage in her chest leads.  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  Here today with cough, malaise for about one week.  Also notes atypical chest discomfort to 4 days- not present now.  Her CXR is negative for pneumonia, CBC is normal.  Will use tessalon perles for cough.  Gave an rx for doxycyline to hold- suspect she has a viral infection and she has had some diarrhea, so she will not sue this unless her sx persist.    Discussed her chest discomfort in detail.  Explained that I cannot totally rule-out cardiac pain/ ischemia here in clinic. Offered to arrange an ER visit for a rule- out today.  She declines this but will see cardiology again for follow-up.  In the meantime  she will continue her daily aspirin.  If any persistent or severe CP she will seek help right away.   Signed Abbe Amsterdam, MD

## 2013-09-09 NOTE — Patient Instructions (Signed)
Use the tessalon perles as needed for cough.  Rest and take it easy!  We will get you in to see cardiology for a follow-up.   If you do develop persistent, severe or worsening chest pain or any chest pain associated with exertion get help right away.    Hold onto the doxycycline right now- we can use it if not better in the next few days.   If your diarrhea is getting worse please do NOT use the antibiotic and let me know.

## 2013-09-27 ENCOUNTER — Encounter: Payer: Self-pay | Admitting: Internal Medicine

## 2013-09-27 ENCOUNTER — Ambulatory Visit (INDEPENDENT_AMBULATORY_CARE_PROVIDER_SITE_OTHER): Payer: BC Managed Care – PPO | Admitting: Internal Medicine

## 2013-09-27 VITALS — BP 120/86 | HR 89 | Ht 64.5 in | Wt 188.0 lb

## 2013-09-27 DIAGNOSIS — I472 Ventricular tachycardia, unspecified: Secondary | ICD-10-CM

## 2013-09-27 DIAGNOSIS — R0789 Other chest pain: Secondary | ICD-10-CM

## 2013-09-27 DIAGNOSIS — I4729 Other ventricular tachycardia: Secondary | ICD-10-CM

## 2013-09-27 DIAGNOSIS — R079 Chest pain, unspecified: Secondary | ICD-10-CM

## 2013-09-27 NOTE — Patient Instructions (Signed)
Your physician has requested that you have an exercise tolerance test. For further information please visit [email or web address]. Please also follow instruction sheet, as given.   

## 2013-09-27 NOTE — Progress Notes (Signed)
HPI Carmen Brown returns today after a long absence from our arrhythmia clinic. She has been followed by her primary MD's, Dr. [name] and Dr. Patsy Lager. She has a remote history of repetitive monomorphic VT, on amiodarone. She initially had mild LV dysfunction and this had normalized. She notes that several family members have developed CAD and she has had atypical chest pain, non-exertional. No syncope or recurrent palpitations. She has struggle with weight gain. She works full time as a Animal nutritionist.  Allergies  Allergen Reactions  . Epinephrine Other (See Comments)    unknown  . Latex     rash     Current Outpatient Prescriptions  Medication Sig Dispense Refill  . aspirin 81 MG tablet Take 81 mg by mouth daily.        . Azelastine HCl 0.15 % SOLN Place one spray in each nostril one time daily  30 mL  0  . Cholecalciferol (VITAMIN D3) 2000 UNIT TABS Take 1 tablet by mouth daily.       . Coenzyme Q10 (CO Q 10 PO) Take 50 mg by mouth daily.       . Glucosamine Sulfate 750 MG CAPS Take 2 capsules by mouth daily.      Marland Kitchen lisinopril (PRINIVIL,ZESTRIL) 20 MG tablet Take 20 mg by mouth daily.      . metoprolol succinate (TOPROL-XL) 25 MG 24 hr tablet Take 25 mg by mouth daily.      . Multiple Vitamin (MULTIVITAMIN) capsule Take 1 capsule by mouth 2 (two) times daily.        . nitroGLYCERIN (NITROSTAT) 0.4 MG SL tablet Place 1 tablet (0.4 mg total) under the tongue every 5 (five) minutes x 3 doses as needed for chest pain (call 911 if no relief after 3).  30 tablet  0  . NON FORMULARY (Suprema Dophilus) Take one capsule once a day      . NON FORMULARY (Tart Cherry Ultra) Take three capsules once a day      . omega-3 acid ethyl esters (LOVAZA) 1 G capsule Take 1 g by mouth 2 (two) times daily.       No current facility-administered medications for this visit.     Past Medical History  Diagnosis Date  . Paroxysmal VT   . Thyroid disease   . Cardiomyopathy   . Allergy   . Arthritis    . Heart murmur   . Chest pain   . Chest pressure   . Obesity   . Hypertension, benign     systemic  . Cardiomegaly   . Menopausal syndrome   . Osteoarthritis of lower leg, localized     ROS:   All systems reviewed and negative except as noted in the HPI.   Past Surgical History  Procedure Laterality Date  . Ventricular tacticartia    . Mass excision  03/15/2012    Procedure: MINOR EXCISION OF MASS;  Surgeon: Wyn Forster., MD;  Location: [facility];  Service: Orthopedics;  Laterality: Right;  debride IP right long finger, excision mucoid cyst     Family History  Problem Relation Age of Onset  . Heart disease Mother   . Heart disease Father   . Stroke Maternal Grandmother   . Diabetes Maternal Grandfather      History   Social History  . Marital Status: Single    Spouse Name: N/A    Number of Children: N/A  . Years of Education:  N/A   Occupational History  . Not on file.   Social History Main Topics  . Smoking status: Never Smoker   . Smokeless tobacco: Never Used  . Alcohol Use: Yes     Comment: occasional  . Drug Use: No  . Sexual Activity: Not on file   Other Topics Concern  . Not on file   Social History Narrative  . No narrative on file     BP 120/86  Pulse 89  Ht 5' 4.5" (1.638 m)  Wt 188 lb (85.276 kg)  BMI 31.78 kg/m2  Physical Exam:  Well appearing NAD HEENT: Unremarkable Neck:  No JVD, no thyromegally Back:  No CVA tenderness Lungs:  Clear with no wheezes HEART:  Regular rate rhythm, no murmurs, no rubs, no clicks Abd:  soft, positive bowel sounds, no organomegally, no rebound, no guarding Ext:  2 plus pulses, no edema, no cyanosis, no clubbing Skin:  No rashes no nodules Neuro:  CN II through XII intact, motor grossly intact  EKG - nsr with a septal MI pattern vs poor R wave progression.  Assess/Plan:

## 2013-09-27 NOTE — Assessment & Plan Note (Signed)
Her symptoms are very atypical but she does have multiple risk factors. I have asked the patient to undergo exercise treadmill testing. Will schedule this in the coming weeks.

## 2013-09-27 NOTE — Assessment & Plan Note (Signed)
She has had no recurrent arrhythmias. Will follow.

## 2013-10-20 ENCOUNTER — Other Ambulatory Visit: Payer: Self-pay | Admitting: Family Medicine

## 2013-10-24 ENCOUNTER — Other Ambulatory Visit: Payer: Self-pay | Admitting: Family Medicine

## 2013-11-01 ENCOUNTER — Ambulatory Visit (INDEPENDENT_AMBULATORY_CARE_PROVIDER_SITE_OTHER): Payer: BC Managed Care – PPO | Admitting: Physician Assistant

## 2013-11-01 ENCOUNTER — Other Ambulatory Visit: Payer: Self-pay | Admitting: Family Medicine

## 2013-11-01 VITALS — BP 120/88 | HR 90 | Temp 98.2°F | Resp 18 | Ht 64.5 in | Wt 184.0 lb

## 2013-11-01 DIAGNOSIS — N949 Unspecified condition associated with female genital organs and menstrual cycle: Secondary | ICD-10-CM

## 2013-11-01 DIAGNOSIS — N390 Urinary tract infection, site not specified: Secondary | ICD-10-CM

## 2013-11-01 DIAGNOSIS — R300 Dysuria: Secondary | ICD-10-CM

## 2013-11-01 DIAGNOSIS — N9489 Other specified conditions associated with female genital organs and menstrual cycle: Secondary | ICD-10-CM

## 2013-11-01 LAB — POCT UA - MICROSCOPIC ONLY
Casts, Ur, LPF, POC: NEGATIVE
Crystals, Ur, HPF, POC: NEGATIVE
Mucus, UA: NEGATIVE
Yeast, UA: NEGATIVE

## 2013-11-01 LAB — POCT URINALYSIS DIPSTICK
Bilirubin, UA: NEGATIVE
Glucose, UA: NEGATIVE
Ketones, UA: NEGATIVE
Nitrite, UA: NEGATIVE
Protein, UA: 30
Spec Grav, UA: >=1.03
Urobilinogen, UA: 0.2
pH, UA: 5.5

## 2013-11-01 LAB — POCT WET PREP WITH KOH
BACTERIA WET PREP HPF POC: NEGATIVE
Clue Cells Wet Prep HPF POC: NEGATIVE
KOH Prep POC: NEGATIVE
RBC WET PREP PER HPF POC: NEGATIVE
Trichomonas, UA: NEGATIVE
YEAST WET PREP PER HPF POC: NEGATIVE

## 2013-11-01 MED ORDER — CEPHALEXIN 500 MG PO CAPS: 500.0000 mg | ORAL_CAPSULE | Freq: Two times a day (BID) | ORAL | Status: DC

## 2013-11-01 NOTE — Progress Notes (Signed)
Subjective:    Patient ID: Carmen Brown, female    DOB: [birth year], 73 y.o.   MRN: [id number]  HPI   [name] is a very pleasant [age] yr old female here with concern for a bladder infection.  First noticed symptoms a couple weeks ago - used otc meds and symptoms seemed to improve, but have since recurred.  Then she thought perhaps she had a yeast infection, so tried otc meds - symptoms kind of subsided but are now back.  Current symptoms include bladder pressure, dysuria, frequency, urgency.  She also has some vaginal burning.  Denies itching or irritation.  Denies vaginal discharge.  She is sexually active but denies concern for STI and declines testing today.  She denies abd pain, NV, FC.   Review of Systems  Constitutional: Negative for fever and chills.  Respiratory: Negative.   Cardiovascular: Negative.   Gastrointestinal: Negative for nausea, vomiting and abdominal pain.  Genitourinary: Positive for dysuria, urgency and frequency. Negative for hematuria and vaginal discharge.  Musculoskeletal: Negative.        Objective:   Physical Exam  Vitals reviewed. Constitutional: She is oriented to person, place, and time. She appears well-developed and well-nourished. No distress.  HENT:  Head: Normocephalic and atraumatic.  Eyes: Conjunctivae are normal. No scleral icterus.  Cardiovascular: Normal rate, regular rhythm and normal heart sounds.   Pulmonary/Chest: Effort normal and breath sounds normal. She has no wheezes. She has no rales.  Abdominal: Soft. Bowel sounds are normal. There is no tenderness. There is no CVA tenderness.  Neurological: She is alert and oriented to person, place, and time.  Skin: Skin is warm and dry.  Psychiatric: She has a normal mood and affect. Her behavior is normal.    Results for orders placed in visit on 11/01/13  POCT URINALYSIS DIPSTICK      Result Value Ref Range   Color, UA yellow     Clarity, UA cloudy     Glucose, UA neg     Bilirubin,  UA neg     Ketones, UA neg     Spec Grav, UA >=1.030     Blood, UA moderate     pH, UA 5.5     Protein, UA 30     Urobilinogen, UA 0.2     Nitrite, UA neg     Leukocytes, UA moderate (2+)    POCT UA - MICROSCOPIC ONLY      Result Value Ref Range   WBC, Ur, HPF, POC TNTC     RBC, urine, microscopic 8-10     Bacteria, U Microscopic trace     Mucus, UA neg     Epithelial cells, urine per micros 1-3     Crystals, Ur, HPF, POC neg     Casts, Ur, LPF, POC neg     Yeast, UA neg    POCT WET PREP WITH KOH      Result Value Ref Range   Trichomonas, UA Negative     Clue Cells Wet Prep HPF POC neg     Epithelial Wet Prep HPF POC 0-1     Yeast Wet Prep HPF POC neg     Bacteria Wet Prep HPF POC neg     RBC Wet Prep HPF POC neg     WBC Wet Prep HPF POC 0-3     KOH Prep POC Negative         Assessment & Plan:  UTI (urinary tract infection) - Plan:  Urine culture, cephALEXin (KEFLEX) 500 MG capsule  Burning with urination - Plan: POCT urinalysis dipstick, POCT UA - Microscopic Only, POCT Wet Prep with KOH, Urine culture  Vaginal burning - Plan: POCT Wet Prep with KOH   Carmen Brown is a very pleasant 73 yr old female here with UTI.  Will initiate treatment with Keflex and send cx.  Push fluids.  She has also had complaint of vaginal burning but wet prep is neg for yeast or BV - suspect symptoms due to UTI.    Pt to call or RTC if worsening or not improving  E. Frances Furbish MHS, PA-C [facility] 3/20/20153:38 PM

## 2013-11-01 NOTE — Patient Instructions (Signed)
The antibiotic prescribed today is for your present infection only. It is very important to follow the directions for the medication prescribed. Antibiotics are generally given for a specified period of time (7-10 days, for example) to be taken at specific intervals (every 4, 6, 8 or 12 hours). This is necessary to keep the right amount of the medication in the bloodstream. Too much of the medication may cause an adverse reaction, too little may not be completely effective.  To clear your infection completely, continue taking the antibiotic for the full time of treatment, even if you begin to feel better after a few days.  If you miss a dose of the antibiotic, take it as soon as possible. Then go back to your regular dosing schedule. However, don't double up doses.    Begin taking the Keflex as directed.    Drink plenty of water - this will help flush bacteria out of your bladder  I will let you know when your culture results are back and if we have to make any changes based on that  Please let us know if any symptoms are worsening or not improving  Urinary Tract Infection Urinary tract infections (UTIs) can develop anywhere along your urinary tract. Your urinary tract is your body's drainage system for removing wastes and extra water. Your urinary tract includes two kidneys, two ureters, a bladder, and a urethra. Your kidneys are a pair of bean-shaped organs. Each kidney is about the size of your fist. They are located below your ribs, one on each side of your spine. CAUSES Infections are caused by microbes, which are microscopic organisms, including fungi, viruses, and bacteria. These organisms are so small that they can only be seen through a microscope. Bacteria are the microbes that most commonly cause UTIs. SYMPTOMS  Symptoms of UTIs may vary by age and gender of the patient and by the location of the infection. Symptoms in young women typically include a frequent and intense urge to urinate and  a painful, burning feeling in the bladder or urethra during urination. Older women and men are more likely to be tired, shaky, and weak and have muscle aches and abdominal pain. A fever may mean the infection is in your kidneys. Other symptoms of a kidney infection include pain in your back or sides below the ribs, nausea, and vomiting. DIAGNOSIS To diagnose a UTI, your caregiver will ask you about your symptoms. Your caregiver also will ask to provide a urine sample. The urine sample will be tested for bacteria and white blood cells. White blood cells are made by your body to help fight infection. TREATMENT  Typically, UTIs can be treated with medication. Because most UTIs are caused by a bacterial infection, they usually can be treated with the use of antibiotics. The choice of antibiotic and length of treatment depend on your symptoms and the type of bacteria causing your infection. HOME CARE INSTRUCTIONS  If you were prescribed antibiotics, take them exactly as your caregiver instructs you. Finish the medication even if you feel better after you have only taken some of the medication.  Drink enough water and fluids to keep your urine clear or pale yellow.  Avoid caffeine, tea, and carbonated beverages. They tend to irritate your bladder.  Empty your bladder often. Avoid holding urine for long periods of time.  Empty your bladder before and after sexual intercourse.  After a bowel movement, women should cleanse from front to back. Use each tissue only once. SEEK MEDICAL  CARE IF:   You have back pain.  You develop a fever.  Your symptoms do not begin to resolve within 3 days. SEEK IMMEDIATE MEDICAL CARE IF:   You have severe back pain or lower abdominal pain.  You develop chills.  You have nausea or vomiting.  You have continued burning or discomfort with urination. MAKE SURE YOU:   Understand these instructions.  Will watch your condition.  Will get help right away if you  are not doing well or get worse. Document Released: 05/11/2005 Document Revised: 01/31/2012 Document Reviewed: 09/09/2011 [facility] Patient Information 2014 [place], [place].

## 2013-11-03 LAB — URINE CULTURE: Colony Count: 35000

## 2013-12-20 ENCOUNTER — Ambulatory Visit: Payer: BC Managed Care – PPO

## 2013-12-20 ENCOUNTER — Encounter: Payer: Self-pay | Admitting: Radiology

## 2013-12-20 ENCOUNTER — Ambulatory Visit (INDEPENDENT_AMBULATORY_CARE_PROVIDER_SITE_OTHER): Payer: BC Managed Care – PPO | Admitting: Internal Medicine

## 2013-12-20 VITALS — BP 142/90 | HR 81 | Temp 98.1°F | Resp 18 | Ht 63.75 in | Wt 191.4 lb

## 2013-12-20 DIAGNOSIS — M25561 Pain in right knee: Secondary | ICD-10-CM

## 2013-12-20 DIAGNOSIS — R202 Paresthesia of skin: Secondary | ICD-10-CM

## 2013-12-20 DIAGNOSIS — M25562 Pain in left knee: Principal | ICD-10-CM

## 2013-12-20 DIAGNOSIS — N390 Urinary tract infection, site not specified: Secondary | ICD-10-CM

## 2013-12-20 DIAGNOSIS — R209 Unspecified disturbances of skin sensation: Secondary | ICD-10-CM

## 2013-12-20 DIAGNOSIS — M545 Low back pain, unspecified: Secondary | ICD-10-CM

## 2013-12-20 DIAGNOSIS — M542 Cervicalgia: Secondary | ICD-10-CM

## 2013-12-20 DIAGNOSIS — M25569 Pain in unspecified knee: Secondary | ICD-10-CM

## 2013-12-20 LAB — POCT CBC
Granulocyte percent: 64.5 %G (ref 37–80)
HCT, POC: 44.9 % (ref 37.7–47.9)
Hemoglobin: 14.7 g/dL (ref 12.2–16.2)
LYMPH, POC: 1.8 (ref 0.6–3.4)
MCH: 32.9 pg — AB (ref 27–31.2)
MCHC: 32.7 g/dL (ref 31.8–35.4)
MCV: 100.5 fL — AB (ref 80–97)
MID (cbc): 0.5 (ref 0–0.9)
MPV: 11.1 fL (ref 0–99.8)
POC GRANULOCYTE: 4.3 (ref 2–6.9)
POC LYMPH %: 27.5 % (ref 10–50)
POC MID %: 8 % (ref 0–12)
Platelet Count, POC: 216 10*3/uL (ref 142–424)
RBC: 4.47 M/uL (ref 4.04–5.48)
RDW, POC: 13.7 %
WBC: 6.7 10*3/uL (ref 4.6–10.2)

## 2013-12-20 LAB — BASIC METABOLIC PANEL
BUN: 20 mg/dL (ref 6–23)
CALCIUM: 9.8 mg/dL (ref 8.4–10.5)
CO2: 27 mEq/L (ref 19–32)
CREATININE: 0.93 mg/dL (ref 0.50–1.10)
Chloride: 103 mEq/L (ref 96–112)
GLUCOSE: 89 mg/dL (ref 70–99)
POTASSIUM: 5 meq/L (ref 3.5–5.3)
Sodium: 138 mEq/L (ref 135–145)

## 2013-12-20 LAB — POCT URINALYSIS DIPSTICK
Bilirubin, UA: NEGATIVE
GLUCOSE UA: NEGATIVE
Ketones, UA: NEGATIVE
Leukocytes, UA: NEGATIVE
NITRITE UA: NEGATIVE
PROTEIN UA: NEGATIVE
RBC UA: NEGATIVE
UROBILINOGEN UA: 0.2
pH, UA: 5

## 2013-12-20 LAB — POCT UA - MICROSCOPIC ONLY
Bacteria, U Microscopic: NEGATIVE
CASTS, UR, LPF, POC: NEGATIVE
CRYSTALS, UR, HPF, POC: NEGATIVE
MUCUS UA: POSITIVE
YEAST UA: NEGATIVE

## 2013-12-20 LAB — GLUCOSE, POCT (MANUAL RESULT ENTRY): POC GLUCOSE: 77 mg/dL (ref 70–99)

## 2013-12-20 MED ORDER — CELECOXIB 200 MG PO CAPS: ORAL_CAPSULE | ORAL | Status: DC

## 2013-12-20 MED ORDER — GABAPENTIN 300 MG PO CAPS: 300.0000 mg | ORAL_CAPSULE | Freq: Three times a day (TID) | ORAL | Status: DC

## 2013-12-20 NOTE — Progress Notes (Signed)
Subjective:    Patient ID: Carmen Brown, female    DOB: 08/18/1940, 73 y.o.   MRN: [id number]  HPI 73 year old female here for bilateral knee pain, lumbar pain, paresthesia in both hands, and prior UTI. Pt has had the knee pain for about a month. Both knees started bothering her around the same time. The back pain started around the same time as the knee pain. The paresthesia in her hands started around a month ago. No known injury. Has been taking ibuprofen and Aleve for the pain. Prior UTI cleared up.    Review of Systems     Objective:   Physical Exam  Constitutional: She is oriented to person, place, and time. She appears well-developed and well-nourished. No distress.  HENT:  Head: Normocephalic.  Eyes: EOM are normal. Pupils are equal, round, and reactive to light.  Neck: Normal range of motion. Neck supple.  Cardiovascular: Normal rate, regular rhythm and intact distal pulses.   Murmur heard. Pulmonary/Chest: Effort normal and breath sounds normal.  Musculoskeletal: She exhibits tenderness.  Neurological: She is alert and oriented to person, place, and time. She has normal strength and normal reflexes. A sensory deficit is present. No cranial nerve deficit. She exhibits normal muscle tone. She displays a negative Romberg sign. Coordination normal.  Diffuse paresthesias bot h hands glove distributuion  No weakness  Psychiatric: She has a normal mood and affect. Her behavior is normal. Judgment normal.     [facility] Policy for Prescribing Controlled Substances (Revised 06/2012) 1. Prescriptions for controlled substances will be filled by ONE provider at [facility] with whom you have established and developed a plan for your care, including follow-up. 2. You are encouraged to edule an appointment with your prescriber at our appointment center for follow-up visits whenever possible. 3. If you request a prescription for the controlled substance while [facility] an [facility] (with  someone other than your regular prescriber), you MAY be given a ONE-TIME prescription for a 30-day supply of the controlled substance, to allow time for you to return to see your regular prescriber for additional prescriptions Results for orders placed in visit on 12/20/13  POCT CBC      Result Value Ref Range   WBC 6.7  4.6 - 10.2 K/uL   Lymph, poc 1.8  0.6 - 3.4   POC LYMPH PERCENT 27.5  10 - 50 %L   MID (cbc) 0.5  0 - 0.9   POC MID % 8.0  0 - 12 %M   POC Granulocyte 4.3  2 - 6.9   Granulocyte percent 64.5  37 - 80 %G   RBC 4.47  4.04 - 5.48 M/uL   Hemoglobin 14.7  12.2 - 16.2 g/dL   HCT, POC 78.244.9  95.637.7 - 47.9 %   MCV 100.5 (*) 80 - 97 fL   MCH, POC 32.9 (*) 27 - 31.2 pg   MCHC 32.7  31.8 - 35.4 g/dL   RDW, POC 21.313.7     Platelet Count, POC 216  142 - 424 K/uL   MPV 11.1  0 - 99.8 fL  GLUCOSE, POCT (MANUAL RESULT ENTRY)      Result Value Ref Range   POC Glucose 77  70 - 99 mg/dl  POCT UA - MICROSCOPIC ONLY      Result Value Ref Range   WBC, Ur, HPF, POC 2-3     RBC, urine, microscopic 1-2     Bacteria, U Microscopic neg  Mucus, UA positive     Epithelial cells, urine per micros 1-2     Crystals, Ur, HPF, POC neg     Casts, Ur, LPF, POC neg     Yeast, UA neg    POCT URINALYSIS DIPSTICK      Result Value Ref Range   Color, UA yellow     Clarity, UA clear     Glucose, UA neg     Bilirubin, UA neg     Ketones, UA neg     Spec Grav, UA >=1.030     Blood, UA neg     pH, UA 5.0     Protein, UA neg     Urobilinogen, UA 0.2     Nitrite, UA neg     Leukocytes, UA Negative    [facility] reading (PRIMARY) by  Dr.[name] cspine ddd and djd c4-c7, lspine l5-s1 ddd, knees both djd medial joint and right pf joint       Assessment & Plan:  Trial of gabapentin for paresthesias and celebrex for joint arthritis. Refer to Dr. 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

## 2013-12-20 NOTE — Progress Notes (Signed)
   Subjective:    Patient ID: Carmen Brown, female    DOB: 07/27/1941, 73 y.o.   MRN: [id number]  HPI    Review of Systems     Objective:   Physical Exam        Assessment & Plan:

## 2013-12-20 NOTE — Patient Instructions (Signed)
Knee Exercises EXERCISES RANGE OF MOTION(ROM) AND STRETCHING EXERCISES These exercises may help you when beginning to rehabilitate your injury. Your symptoms may resolve with or without further involvement from your physician, physical therapist or athletic trainer. While completing these exercises, remember:   Restoring tissue flexibility helps normal motion to return to the joints. This allows healthier, less painful movement and activity.  An effective stretch should be held for at least 30 seconds.  A stretch should never be painful. You should only feel a gentle lengthening or release in the stretched tissue. STRETCH - Knee Extension, Prone  Lie on your stomach on a firm surface, such as a bed or countertop. Place your right / left knee and leg just beyond the edge of the surface. You may wish to place a towel under the far end of your right / left thigh for comfort.  Relax your leg muscles and allow gravity to straighten your knee. Your clinician may advise you to add an ankle weight if more resistance is helpful for you.  You should feel a stretch in the back of your right / left knee. Hold this position for __________ seconds. Repeat __________ times. Complete this stretch __________ times per day. * Your physician, physical therapist or athletic trainer may ask you to add ankle weight to enhance your stretch.  RANGE OF MOTION - Knee Flexion, Active  Lie on your back with both knees straight. (If this causes back discomfort, bend your opposite knee, placing your foot flat on the floor.)  Slowly slide your heel back toward your buttocks until you feel a gentle stretch in the front of your knee or thigh.  Hold for __________ seconds. Slowly slide your heel back to the starting position. Repeat __________ times. Complete this exercise __________ times per day.  STRETCH - Quadriceps, Prone   Lie on your stomach on a firm surface, such as a bed or padded floor.  Bend your right /  left knee and grasp your ankle. If you are unable to reach, your ankle or pant leg, use a belt around your foot to lengthen your reach.  Gently pull your heel toward your buttocks. Your knee should not slide out to the side. You should feel a stretch in the front of your thigh and/or knee.  Hold this position for __________ seconds. Repeat __________ times. Complete this stretch __________ times per day.  STRETCH  Hamstrings, Supine   Lie on your back. Loop a belt or towel over the ball of your right / left foot.  Straighten your right / left knee and slowly pull on the belt to raise your leg. Do not allow the right / left knee to bend. Keep your opposite leg flat on the floor.  Raise the leg until you feel a gentle stretch behind your right / left knee or thigh. Hold this position for __________ seconds. Repeat __________ times. Complete this stretch __________ times per day.  STRENGTHENING EXERCISES These exercises may help you when beginning to rehabilitate your injury. They may resolve your symptoms with or without further involvement from your physician, physical therapist or athletic trainer. While completing these exercises, remember:   Muscles can gain both the endurance and the strength needed for everyday activities through controlled exercises.  Complete these exercises as instructed by your physician, physical therapist or athletic trainer. Progress the resistance and repetitions only as guided.  You may experience muscle soreness or fatigue, but the pain or discomfort you are trying to eliminate should   never worsen during these exercises. If this pain does worsen, stop and make certain you are following the directions exactly. If the pain is still present after adjustments, discontinue the exercise until you can discuss the trouble with your clinician. STRENGTH - Quadriceps, Isometrics  Lie on your back with your right / left leg extended and your opposite knee bent.  Gradually  tense the muscles in the front of your right / left thigh. You should see either your knee cap slide up toward your hip or increased dimpling just above the knee. This motion will push the back of the knee down toward the floor/mat/bed on which you are lying.  Hold the muscle as tight as you can without increasing your pain for __________ seconds.  Relax the muscles slowly and completely in between each repetition. Repeat __________ times. Complete this exercise __________ times per day.  STRENGTH - Quadriceps, Short Arcs   Lie on your back. Place a __________ inch towel roll under your knee so that the knee slightly bends.  Raise only your lower leg by tightening the muscles in the front of your thigh. Do not allow your thigh to rise.  Hold this position for __________ seconds. Repeat __________ times. Complete this exercise __________ times per day.  OPTIONAL ANKLE WEIGHTS: Begin with ____________________, but DO NOT exceed ____________________. Increase in 1 pound/0.5 kilogram increments.  STRENGTH - Quadriceps, Straight Leg Raises  Quality counts! Watch for signs that the quadriceps muscle is working to insure you are strengthening the correct muscles and not "cheating" by substituting with healthier muscles.  Lay on your back with your right / left leg extended and your opposite knee bent.  Tense the muscles in the front of your right / left thigh. You should see either your knee cap slide up or increased dimpling just above the knee. Your thigh may even quiver.  Tighten these muscles even more and raise your leg 4 to 6 inches off the floor. Hold for __________ seconds.  Keeping these muscles tense, lower your leg.  Relax the muscles slowly and completely in between each repetition. Repeat __________ times. Complete this exercise __________ times per day.  STRENGTH - Hamstring, Curls  Lay on your stomach with your legs extended. (If you lay on a bed, your feet may hang over the  edge.)  Tighten the muscles in the back of your thigh to bend your right / left knee up to 90 degrees. Keep your hips flat on the bed/floor.  Hold this position for __________ seconds.  Slowly lower your leg back to the starting position. Repeat __________ times. Complete this exercise __________ times per day.  OPTIONAL ANKLE WEIGHTS: Begin with ____________________, but DO NOT exceed ____________________. Increase in 1 pound/0.5 kilogram increments.  STRENGTH  Quadriceps, Squats  Stand in a door frame so that your feet and knees are in line with the frame.  Use your hands for balance, not support, on the frame.  Slowly lower your weight, bending at the hips and knees. Keep your lower legs upright so that they are parallel with the door frame. Squat only within the range that does not increase your knee pain. Never let your hips drop below your knees.  Slowly return upright, pushing with your legs, not pulling with your hands. Repeat __________ times. Complete this exercise __________ times per day.  STRENGTH - Quadriceps, Wall Slides  Follow guidelines for form closely. Increased knee pain often results from poorly placed feet or knees.  Lean against   a smooth wall or door and walk your feet out 18-24 inches. Place your feet hip-width apart.  Slowly slide down the wall or door until your knees bend __________ degrees.* Keep your knees over your heels, not your toes, and in line with your hips, not falling to either side.  Hold for __________ seconds. Stand up to rest for __________ seconds in between each repetition. Repeat __________ times. Complete this exercise __________ times per day. * Your physician, physical therapist or athletic trainer will alter this angle based on your symptoms and progress. Document Released: 06/15/2005 Document Revised: 10/24/2011 Document Reviewed: 11/13/2008 [facility] Patient Information 2014 [place], [place]. Knee Exercises EXERCISES RANGE OF  MOTION(ROM) AND STRETCHING EXERCISES These exercises may help you when beginning to rehabilitate your injury. Your symptoms may resolve with or without further involvement from your physician, physical therapist or athletic trainer. While completing these exercises, remember:   Restoring tissue flexibility helps normal motion to return to the joints. This allows healthier, less painful movement and activity.  An effective stretch should be held for at least 30 seconds.  A stretch should never be painful. You should only feel a gentle lengthening or release in the stretched tissue. STRETCH - Knee Extension, Prone  Lie on your stomach on a firm surface, such as a bed or countertop. Place your right / left knee and leg just beyond the edge of the surface. You may wish to place a towel under the far end of your right / left thigh for comfort.  Relax your leg muscles and allow gravity to straighten your knee. Your clinician may advise you to add an ankle weight if more resistance is helpful for you.  You should feel a stretch in the back of your right / left knee. Hold this position for __________ seconds. Repeat __________ times. Complete this stretch __________ times per day. * Your physician, physical therapist or athletic trainer may ask you to add ankle weight to enhance your stretch.  RANGE OF MOTION - Knee Flexion, Active  Lie on your back with both knees straight. (If this causes back discomfort, bend your opposite knee, placing your foot flat on the floor.)  Slowly slide your heel back toward your buttocks until you feel a gentle stretch in the front of your knee or thigh.  Hold for __________ seconds. Slowly slide your heel back to the starting position. Repeat __________ times. Complete this exercise __________ times per day.  STRETCH - Quadriceps, Prone   Lie on your stomach on a firm surface, such as a bed or padded floor.  Bend your right / left knee and grasp your ankle. If you  are unable to reach, your ankle or pant leg, use a belt around your foot to lengthen your reach.  Gently pull your heel toward your buttocks. Your knee should not slide out to the side. You should feel a stretch in the front of your thigh and/or knee.  Hold this position for __________ seconds. Repeat __________ times. Complete this stretch __________ times per day.  STRETCH  Hamstrings, Supine   Lie on your back. Loop a belt or towel over the ball of your right / left foot.  Straighten your right / left knee and slowly pull on the belt to raise your leg. Do not allow the right / left knee to bend. Keep your opposite leg flat on the floor.  Raise the leg until you feel a gentle stretch behind your right / left knee or thigh. Hold  this position for __________ seconds. Repeat __________ times. Complete this stretch __________ times per day.  STRENGTHENING EXERCISES These exercises may help you when beginning to rehabilitate your injury. They may resolve your symptoms with or without further involvement from your physician, physical therapist or athletic trainer. While completing these exercises, remember:   Muscles can gain both the endurance and the strength needed for everyday activities through controlled exercises.  Complete these exercises as instructed by your physician, physical therapist or athletic trainer. Progress the resistance and repetitions only as guided.  You may experience muscle soreness or fatigue, but the pain or discomfort you are trying to eliminate should never worsen during these exercises. If this pain does worsen, stop and make certain you are following the directions exactly. If the pain is still present after adjustments, discontinue the exercise until you can discuss the trouble with your clinician. STRENGTH - Quadriceps, Isometrics  Lie on your back with your right / left leg extended and your opposite knee bent.  Gradually tense the muscles in the front of your  right / left thigh. You should see either your knee cap slide up toward your hip or increased dimpling just above the knee. This motion will push the back of the knee down toward the floor/mat/bed on which you are lying.  Hold the muscle as tight as you can without increasing your pain for __________ seconds.  Relax the muscles slowly and completely in between each repetition. Repeat __________ times. Complete this exercise __________ times per day.  STRENGTH - Quadriceps, Short Arcs   Lie on your back. Place a __________ inch towel roll under your knee so that the knee slightly bends.  Raise only your lower leg by tightening the muscles in the front of your thigh. Do not allow your thigh to rise.  Hold this position for __________ seconds. Repeat __________ times. Complete this exercise __________ times per day.  OPTIONAL ANKLE WEIGHTS: Begin with ____________________, but DO NOT exceed ____________________. Increase in 1 pound/0.5 kilogram increments.  STRENGTH - Quadriceps, Straight Leg Raises  Quality counts! Watch for signs that the quadriceps muscle is working to insure you are strengthening the correct muscles and not "cheating" by substituting with healthier muscles.  Lay on your back with your right / left leg extended and your opposite knee bent.  Tense the muscles in the front of your right / left thigh. You should see either your knee cap slide up or increased dimpling just above the knee. Your thigh may even quiver.  Tighten these muscles even more and raise your leg 4 to 6 inches off the floor. Hold for __________ seconds.  Keeping these muscles tense, lower your leg.  Relax the muscles slowly and completely in between each repetition. Repeat __________ times. Complete this exercise __________ times per day.  STRENGTH - Hamstring, Curls  Lay on your stomach with your legs extended. (If you lay on a bed, your feet may hang over the edge.)  Tighten the muscles in the back  of your thigh to bend your right / left knee up to 90 degrees. Keep your hips flat on the bed/floor.  Hold this position for __________ seconds.  Slowly lower your leg back to the starting position. Repeat __________ times. Complete this exercise __________ times per day.  OPTIONAL ANKLE WEIGHTS: Begin with ____________________, but DO NOT exceed ____________________. Increase in 1 pound/0.5 kilogram increments.  STRENGTH  Quadriceps, Squats  Stand in a door frame so that your feet and knees  are in line with the frame.  Use your hands for balance, not support, on the frame.  Slowly lower your weight, bending at the hips and knees. Keep your lower legs upright so that they are parallel with the door frame. Squat only within the range that does not increase your knee pain. Never let your hips drop below your knees.  Slowly return upright, pushing with your legs, not pulling with your hands. Repeat __________ times. Complete this exercise __________ times per day.  STRENGTH - Quadriceps, Wall Slides  Follow guidelines for form closely. Increased knee pain often results from poorly placed feet or knees.  Lean against a smooth wall or door and walk your feet out 18-24 inches. Place your feet hip-width apart.  Slowly slide down the wall or door until your knees bend __________ degrees.* Keep your knees over your heels, not your toes, and in line with your hips, not falling to either side.  Hold for __________ seconds. Stand up to rest for __________ seconds in between each repetition. Repeat __________ times. Complete this exercise __________ times per day. * Your physician, physical therapist or athletic trainer will alter this angle based on your symptoms and progress. Document Released: 06/15/2005 Document Revised: 10/24/2011 Document Reviewed: 11/13/2008 [facility] Patient Information 2014 [place], [place]. Esguince del ligamento lateral interno de la rodilla con rehabilitacin fase  I (Medial Collateral Knee Ligament Sprain with Phase I Rehab) El ligamento colateral medial (LCM) de la rodilla ayuda a mantener la articulacin de la rodilla en la alineacin apropiada y evita que los huesos se muevan fuera de la alineacin (desplazamiento) hacia el interior (medialmente). La lesin en la rodilla puede causar un desgarro en el ligamento LCM (esguince). Los esguinces pueden sanar por s solos, pero a menudo Engineer, manufacturing systems. Los esguinces se clasifican en tres categoras. Los esguinces de grado 1 ocasionan dolor, pero el tendn no est alargado. En los esguinces de grado 2 hay un ligamento alargado, debido a un estiramiento o desgarro parcial. En el esguince de [place] 2 an se mantiene la funcin, aunque sta puede estar alterada. Un esguince en grado 3 es la ruptura completa del msculo o el tendn, y suele quedar incapacitada la funcin. SNTOMAS  Dolor y sensibilidad en la zona interna de la rodilla.  Sensacin de estallido o ruptura en el momento de la lesin.  Hematoma (contusin) en el lugar de la lesin dentro de las 48 horas.  Rigidez de la rodilla.  Renguera, a menudo se camina con la rodilla doblada. CAUSAS El esguince se produce cuando se aplica una fuerza en el tendn o msculo que es mayor de lo que puede soportar. Los mecanismos ms frecuentes de una lesin son:  Associate Professor (traumatismo) en el lado externo de la rodilla, especialmente si el pie est apoyado sobre el suelo.  Giro brusco del cuerpo y la pierna, mientras el pie est apoyado en el suelo. LOS RIESGOS AUMENTAN CON  En los deportes de contacto (ftbol americano, rugby).  Los deportes que requieren girar o un corte (ftbol).  Poca fuerza y flexibilidad de la rodilla.  Equipo inadecuado. MEDIDAS DE PREVENCIN  Precalentamiento adecuado y elongacin antes de la [place].  Mantener la forma fsica:  [place], flexibilidad y resistencia muscular.  Capacidad  cardiovascular.  Utilice el equipo adecuado (tamao adecuado del taco segn la superficie).  Las rodilleras pueden ser eficaces en la prevencin de las lesiones. PRONSTICO Si se trata adecuadamente, el desgarro del LCM se cura de manera espontnea.  En algunos casos se requiere [place]. POSIBLES COMPLICACIONES:  Recurrencia frecuente de los sntomas, como inestabilidad de la rodilla e hinchazn.  Lesin a otras estructuras de la rodilla.  Lesin en el cartlago de los meniscos, que produce bloqueo e hinchazn de la rodilla.  Lesiones en el cartlago articular, lo que puede traer como consecuencia artritis de rodilla.  Lesin a otros ligamentos de la rodilla (frecuente).  Lesin en los nervios que causa adormecimiento de la zona externa de la pierna, del pie y la rodilla y debilidad o parlisis con incapacidad para elevar la rodilla, el dedo gordo del pie o los dems dedos.  Rigidez de la rodilla (prdida del movimiento). CONSIDERACIONES [employer] PARA EL TRATAMIENTO El tratamiento inicial incluye el uso de medicamentos y la aplicacin de hielo para reducir Chief Technology Officer y la inflamacin. Los ejercicios de elongacin y fortalecimiento pueden ayudar a reducir Chief Technology Officer con la [place]. Los ejercicios pueden Management consultant o con un terapeuta. Se le puede aconsejar que camine con [place], hasta que sea capaz de caminar sin cojear. El Economist el uso de una rodillera con bisagra para Armed forces training and education officer movimiento mientras protege la rodilla lesionada. Para lesiones graves de LCM, o lesiones que involucran otros ligamentos de la rodilla, a menudo se recomienda [place]. MEDICAMENTOS  Si es necesaria la administracin de medicamentos para Chief Technology Officer, se recomiendan los antiinflamatorios no esteroides, como aspirina e ibuprofeno y otros calmantes menores, como acetaminofeno.  No tome medicamentos para el dolor dentro de los [address] previos a la [place].  El profesional podr  prescribirle calmantes si lo considera necesario. Utilcelos como se le indique y slo cuando lo necesite. CALOR Y FRO  El fro (con hielo) debe aplicarse durante 10 a 15 minutos cada 2  3 horas para reducir la inflamacin y Chief Technology Officer e inmediatamente despus de cualquier actividad que agrava los sntomas. Utilice bolsas o un masaje de hielo.  El calor puede usarse antes de Therapist, music y de las actividades de fortalecimiento indicadas por el profesional, el fisioterapeuta o Orthoptist. Utilice una bolsa trmica o un pao hmedo. SOLICITE ATENCIN MDICA SI:   Los sntomas empeoran o no mejoran en 4 a 6 semanas, an realizando Pharmacist, community.  Desarrolla nuevos e inexplicables sntomas. (Los medicamentos indicados en el tratamiento le ocasionan efectos secundarios). EJERCICIOS  EJERCICIOS FASE I EJERCICIOS DE AMPLITUD DE MOVIMIENTOS Cherlynn June Esguince del ligamento lateral interno de la rodilla - Fase I Estos son algunos de los ejercicios iniciales que su mdico, fisioterapeuta o Herbalist puede tener que Education officer, environmental para comenzar su rehabilitacin. Cuando se registren [employer] flexibilidad y fuerza, el mdico podr avanzar a los ejercicios de fase II. Cuando realice los ejercicios recuerde:  Los ejercicios iniciales deben ser suaves. Le ayudarn a reestablecer el movimiento sin aumentar la hinchazn.  Al completar estos ejercicios podr realizar movimientos con menos dolor y estar preparado para ejercicios de fortalecimiento ms agresivos de la Fase II.  Para que sea efectiva, cada elongacin debe realizarse durante al menos 30 segundos.  La elongacin nunca debe ser dolorosa. Deber sentir slo un alargamiento o distensin suave del tejido que estira. AMPLITUD DE MOVIMIENTOS Flexin de la rodilla - Activo  Recustese sobre la espalda con las rodillas rectas. (Si esto le ocasiona molestias en la espalda, doble la rodilla opuesta, apoyando el pie en el suelo).  Lentamente deslice el  taln hacia sus nalgas, hasta que sienta una sensacin de estiramiento en la zona de la rodilla o  el muslo.  Mantenga esta posicin durante __________ segundos. Vuelva lentamente el taln hasta la posicin inicial. Reptalo __________ veces. Realice este ejercicio __________ veces por da.  ELONGACIN Flexin de la rodilla - Supina  Acustese en el suelo, con el taln derecha / izquierdo tocando ligeramente la pared. (Coloque ambos pies en la pared, si no utiliza un marco de la puerta.)  Sin hacer esfuerzo, permita que la gravedad haga descender su pie por la pared lentamente hasta sentir un ligero estiramiento en la zona anterior de la rodilla derecha / izquierdo.  Mantenga esta posicin durante __________ segundos. Luego vuelva la pierna a la posicin inicial usando su pierna sana, si es necesario. Reptalo __________ veces. Realice este estiramiento __________ Anthoney Haradaveces por da.  AMPLITUD DE MOVIMIENTOS Flexin y extensin de la rodilla - Activa - Asistida  Sintese en el borde de una mesa o una silla, con los muslos bien apoyados. Puede ser de utilidad colocar una toalla doblada debajo de su muslo derecha / izquierdo.  Flexin (doblar): Coloque el tobillo de su pierna sana sobre el otro tobillo. Use su pierna sana para doblar suavemente su rodilla derecha / izquierdo hasta sentir una suave tensin que cruce sobre su rodilla.  Mantenga esta posicin durante __________ segundos.  Extensin (elongacin): [employer] la posicin de los tobillos de modo que la pierna Sales executivederecha / izquierdo [place] arriba. Use su pierna sana para doblar suavemente su rodilla derecha / izquierdo hasta sentir una suave tensin que cruce sobre su rodilla.  Mantenga esta posicin durante __________ segundos. Reptalo __________ veces. Realice este ejercicio __________ veces por da. ELONGACIN Extensin de la rodilla en posicin sentado  Sintese con su pierna o taln derecha / izquierdo apoyado en otra silla, en una mesita de  caf o en un posapis.  Relaje los msculos de la pierna, y permita que la gravedad enderece su rodilla.*  Debe sentir un estiramiento en la rodilla derecha / izquierdo. Mantenga esta posicin durante __________ segundos. Reptalo __________ veces. Realice este estiramiento __________ Anthoney Haradaveces por da.  *El mdico, fisioterapeuta o Herbalistentrenador, podrn indicarle que aada __________ Shelly Cossde peso de resistencia mediante la colocacin de un objeto pesado sobre el muslo, por encima de rtula para profundizar el estiramiento.  EJERCICIOS DE FORTALECIMIENTO Esguince del ligamento medial colateral de la rodilla Fase I Estos ejercicios lo ayudarn al comienzo de la rehabilitacin. Los sntomas podrn aliviarse con o sin asistencia adicional de su mdico, fisioterapeuta o Herbalistentrenador. Al completar estos ejercicios, recuerde:   Para poder realizar SUPERVALU INCactividades que le impliquen mayor demanda, probablemente deba realizar ejercicios ms complicados. Su mdico, fisioterapeuta o Herbalistentrenador lo Arts development officerhar progresar en los ejercicios cuando vean que los tejidos se estn curando y que los msculos estn ganando fuerza.  Los msculos pueden ganar tanto la resistencia como la fuerza que necesita para sus actividades diarias a travs de ejercicios controlados.  Realice los ejercicios como se lo indic el mdico, el fisioterapeuta o Orthoptistel entrenador. Aumente la resistencia y las repeticiones segn se le haya indicado. FUERZA Cudriceps, isometra  Recustese sobre la espalda con su pierna derecha / izquierdo extendida y la rodilla opuesta doblada.  Tensione gradualmente los msculos de la zona anterior del muslo derecha / izquierdo. Ver que la rtula se desliza hacia arriba o que se intensifica el hoyuelo que se encuentra justo por arriba de la rodilla. Este movimiento empujar nuevamente la rodilla hacia abajo en direccin al piso, [place]anta o cama sobre la cual est recostado.  Sostenga de ese modo el msculo, tan apretado  como pueda, sin  que Wells Fargoaumente el dolor, durante __________ Toys ''R'' Ussegundos.  Relaje los msculos lenta y completamente entre cada repeticin. Reptalo __________ veces. Realice este ejercicio __________ veces por da.  FUERZA Cudriceps, arcos cortos  Acustese Hartford Financialsobre la espada. Coloque una toalla enrollada de __________ pulgadas debajo de su rodilla, de modo que se doble ligeramente.  Eleve slo la parte inferior de la pierna mediante la tensin de los msculos de la parte frontal del muslo . No permita que el muslo se eleve.  Mantenga esta posicin durante __________ segundos. Reptalo __________ veces. Realice este ejercicio __________ veces por da.  PESO OPCIONAL EN EL TOBILLO: Comience con ____________________, pero no se exceda de ____________________. Aumente de a 1 libra/0.5 kilograme.  FUERZA Cudriceps - Levantar las piernas rectas Lo que importa es la calidad! Observe si hay seales de que el msculo cudriceps est trabajando, para estar seguro de que trabaja el fortalecimiento de los msculos correctos y no "haga trampa" mediante la sustitucin de los msculos sanos.  Recustese sobre la espalda con su pierna derecha / izquierdo extendida y la rodilla opuesta doblada.  Tensione gradualmente los msculos de la zona anterior del muslo derecha / izquierdo. Ver que la rtula se desliza hacia arriba o que se intensifica el hoyuelo que se encuentra justo por arriba de la rodilla. El muslo podr temblar un poco.  Tensione estos msculos an ms y eleve la pierna 10 a 15 cm del piso. Mantenga esta posicin durante __________ segundos.  Mientras mantiene estos msculos tensionados, baje la pierna.  Relaje los msculos lenta y completamente entre cada repeticin. Reptalo __________ veces. Realice este ejercicio __________ veces por da.  FUERZA Tendones, isometricos  Recustese sobre su espalda en una superficie firme.  Doble la rodilla derecha / izquierdo aproximadamente __________ Danelle Berrygrados.  Presione el  taln contra la superficie como si tratara de Liberty Globalempujarlo hacia las nalgas. Aprete los msculos de la parte posterior de los muslos para "clavar" tan duro como pueda, sin Tax inspectoraumentar el dolor.  Mantenga esta posicin durante __________ segundos.  Libere gradualmente la tensin y permita al msculo relajarse completamente durante __________ segundos entre cada ejercicio. Reptalo __________ veces. Realice este ejercicio __________ veces por da.  FUERZA Tendones, giros  HCA Incecustese sobre el estmago con las piernas estiradas. (Si est recostado Whole Foodssobre la cama, puede dejar que los pies cuelguen por el borde).  Contraiga los msculos de la zona posterior del muslo para doblar su rodilla derecha / izquierdo a 90 grados. Mantenga las caderas planas sobre la cama.  Mantenga esta posicin durante __________ segundos.  Baje lentamente la pierna hasta la posicin inicial. Reptalo __________ veces. Realice este ejercicio __________ veces por da.  PESO OPCIONAL EN EL TOBILLO: Comience con ____________________, pero no se exceda de ____________________. Aumente de a 1 libra/0.5 kilograme.  Document Released: 05/18/2006 Document Revised: 10/24/2011 [facility] BeachExitCare Patient Information 2014 [place]ardExitCare, [place]LLC. Nonsteroidal Anti-Inflammatory Medications  Nonsteroidal anti-inflammatory medications (NSAIDs) are a group of medicines often used for relief of pain and inflammation. These drugs include ibuprofen, aspirin, and naproxen. They are widely available in an over-the-counter form. The mechanism by which these drugs work in the body is not clearly understood. NSAIDs have many effects on the body, including pain relief, anti-inflammation, fever reduction, and reducing the blood's ability to clot. Most NSAIDs are taken orally in tablet form. Some may also be taken by injection in a vein (intravenously). WHY ATHLETES USE IT Many athletes use NSAIDs for their anti-inflammatory and pain reducing (analgesic) properties. Athletic  participation  frequently causes aches, pains, and inflammation, which these drugs can treat. There is also some evidence that they speed recovery after injury.  ADVERSE EFFECTS   Nausea.  Stomach pain.  Bleeding from the stomach and intestines.  Inflammation of the kidneys (nephritis).  Inflammation of the liver (hepatitis).  Headache.  Ringing in the ears (tinnitus).  Rash, with sun exposure (photosensitivity).  Increase in fluid volume (fluid retention).  Ulcers of the stomach and small intestine.  Kidney failure.  Liver failure.  Poor control of asthma.  Itching (urticaria).  Increase in nasal polyps (swelling).  Depression.  Loss of red blood cells (anemia).  Loose stools (diarrhea). PHARMACOLOGY  NSAIDs exist in both short-acting and long-acting forms. Many users of NSAIDs experience pain relief with initial doses, that becomes less effective with continual use. Most caregivers believe NSAIDs should be used for 2 to 3 weeks, before they are considered ineffective. Most NSAIDs are excreted from the body through the kidneys. The use of NSAIDs under conditions where dehydration can occur increases the risk of side effects to the kidneys and the liver. This is especially common in older athletes and in athletes not acclimated to the heat. All these drugs are well absorbed when taken by mouth. The price of these drugs is variable. PREVENTION  It is recommend that NSAIDs be used after athletic participation to help recovery, or in the early stages of injury treatment. This is the time when athletes are trying to control pain and inflammation. Many athletes choose to take NSAIDs prophylactically (preventative, before injury). However, this may be associated with an increased risk of side effects. If you experience any side effects, including a decrease in performance while taking NSAIDs, discontinue use and consult your caregiver. If you choose to use NSAIDs regularly. for  longer than 3 to 6 months, you should obtain screening blood tests for the liver, kidney, and bone marrow. Ongoing use may also increase your risk of a stomach ulcer. Document Released: 08/01/2005 Document Revised: 10/24/2011 Document Reviewed: 11/13/2008 [facility] Patient Information 2014 [place], [place].

## 2013-12-28 ENCOUNTER — Other Ambulatory Visit: Payer: Self-pay | Admitting: Internal Medicine

## 2014-01-07 ENCOUNTER — Telehealth: Payer: Self-pay

## 2014-01-07 NOTE — Telephone Encounter (Signed)
Patient is needing a copy of her x-rays on both knees from her visit with Dr. [name] on 12/20/13. Please call when ready for pick up. She says she will pick up x-ray disc before her appt tomorrow at 10pm.  [phone])

## 2014-03-01 ENCOUNTER — Other Ambulatory Visit: Payer: Self-pay | Admitting: Internal Medicine

## 2014-03-07 ENCOUNTER — Other Ambulatory Visit: Payer: Self-pay | Admitting: Internal Medicine

## 2014-03-13 ENCOUNTER — Ambulatory Visit (INDEPENDENT_AMBULATORY_CARE_PROVIDER_SITE_OTHER): Payer: BC Managed Care – PPO | Admitting: Family Medicine

## 2014-03-13 VITALS — BP 126/84 | HR 83 | Temp 98.0°F | Resp 16 | Ht 64.5 in | Wt 191.0 lb

## 2014-03-13 DIAGNOSIS — I4729 Other ventricular tachycardia: Secondary | ICD-10-CM

## 2014-03-13 DIAGNOSIS — I472 Ventricular tachycardia: Secondary | ICD-10-CM

## 2014-03-13 DIAGNOSIS — M2550 Pain in unspecified joint: Secondary | ICD-10-CM

## 2014-03-13 DIAGNOSIS — I10 Essential (primary) hypertension: Secondary | ICD-10-CM

## 2014-03-13 MED ORDER — METOPROLOL SUCCINATE ER 25 MG PO TB24: 25.0000 mg | ORAL_TABLET | Freq: Every day | ORAL | Status: DC

## 2014-03-13 MED ORDER — LISINOPRIL 20 MG PO TABS: 20.0000 mg | ORAL_TABLET | Freq: Every day | ORAL | Status: DC

## 2014-03-13 MED ORDER — CELECOXIB 200 MG PO CAPS: ORAL_CAPSULE | ORAL | Status: DC

## 2014-03-13 NOTE — Progress Notes (Signed)
Called patient to see if she wanted to schedule a CPE with Dr. Patsy Lageropland.  She said she would have to call back after she gets her work schedule for September.

## 2014-04-01 ENCOUNTER — Other Ambulatory Visit: Payer: Self-pay | Admitting: Internal Medicine

## 2014-04-09 NOTE — Progress Notes (Signed)
Subjective:    Patient ID: Carmen Brown, female    DOB: November 30, 1940, 73 y.o.   MRN: [id number] Chief Complaint  Patient presents with  . Medication Refill    lisinopril, metoprolol, and celebrex    HPI  Past Medical History  Diagnosis Date  . Paroxysmal VT   . Thyroid disease   . Cardiomyopathy   . Allergy   . Arthritis   . Heart murmur   . Chest pain   . Chest pressure   . Obesity   . Hypertension, benign     systemic  . Cardiomegaly   . Menopausal syndrome   . Osteoarthritis of lower leg, localized    Current Outpatient Prescriptions on File Prior to Visit  Medication Sig Dispense Refill  . aspirin 81 MG tablet Take 81 mg by mouth daily.        . Cholecalciferol (VITAMIN D3) 2000 UNIT TABS Take 1 tablet by mouth daily.       . Coenzyme Q10 (CO Q 10 PO) Take 50 mg by mouth daily.       . fluticasone (FLONASE) 50 MCG/ACT nasal spray PLACE 2 SPRAYS INTO THE NOSE DAILY.  16 g  7  . Glucosamine Sulfate 750 MG CAPS Take 2 capsules by mouth daily.      . Multiple Vitamin (MULTIVITAMIN) capsule Take 1 capsule by mouth 2 (two) times daily.        . NON FORMULARY (Suprema Dophilus) Take one capsule once a day      . omega-3 acid ethyl esters (LOVAZA) 1 G capsule Take 1 g by mouth 2 (two) times daily.      Marland Kitchen gabapentin (NEURONTIN) 300 MG capsule Take 1 capsule (300 mg total) by mouth 3 (three) times daily.  90 capsule  3   No current facility-administered medications on file prior to visit.   Allergies  Allergen Reactions  . Epinephrine Other (See Comments)    unknown  . Latex     rash     Review of Systems  Constitutional: Positive for fatigue. Negative for fever, chills, diaphoresis and appetite change.  Eyes: Negative for visual disturbance.  Respiratory: Negative for cough and shortness of breath.   Cardiovascular: Negative for chest pain, palpitations and leg swelling.  Genitourinary: Negative for decreased urine volume.  Musculoskeletal: Positive for  arthralgias and back pain.  Neurological: Negative for syncope and headaches.  Hematological: Does not bruise/bleed easily.       Objective:  BP 126/84  Pulse 83  Temp(Src) 98 F (36.7 C)  Resp 16  Ht 5' 4.5" (1.638 m)  Wt 191 lb (86.637 kg)  BMI 32.29 kg/m2  SpO2 96%  Physical Exam  Constitutional: She is oriented to person, place, and time. She appears well-developed and well-nourished. No distress.  HENT:  Head: Normocephalic and atraumatic.  Right Ear: External ear normal.  Left Ear: External ear normal.  Eyes: Conjunctivae are normal. No scleral icterus.  Neck: Normal range of motion. Neck supple. No thyromegaly present.  Cardiovascular: Normal rate, regular rhythm, normal heart sounds and intact distal pulses.   Pulmonary/Chest: Effort normal and breath sounds normal. No respiratory distress.  Musculoskeletal: She exhibits no edema.  Lymphadenopathy:    She has no cervical adenopathy.  Neurological: She is alert and oriented to person, place, and time.  Skin: Skin is warm and dry. She is not diaphoretic. No erythema.  Psychiatric: She has a normal mood and affect. Her behavior is normal.  Assessment & Plan:   Essential hypertension, benign - rec pt sched CPE in sev mos.  Arthritic-like pain  Paroxysmal ventricular tachycardia - Plan: metoprolol succinate (TOPROL-XL) 25 MG 24 hr tablet  Meds ordered this encounter  Medications  . metoprolol succinate (TOPROL-XL) 25 MG 24 hr tablet    Sig: Take 1 tablet (25 mg total) by mouth daily.    Dispense:  90 tablet    Refill:  3  . lisinopril (PRINIVIL,ZESTRIL) 20 MG tablet    Sig: Take 1 tablet (20 mg total) by mouth daily.    Dispense:  90 tablet    Refill:  3  . celecoxib (CELEBREX) 200 MG capsule    Sig: TAKE ONE CAPSULE BY MOUTH TWICE A DAY AS NEEDED FOR ARTHRITIS PAIN    Dispense:  180 capsule    Refill:  3     Norberto Sorenson, MD MPH

## 2014-04-19 ENCOUNTER — Ambulatory Visit (INDEPENDENT_AMBULATORY_CARE_PROVIDER_SITE_OTHER): Payer: BC Managed Care – PPO | Admitting: Internal Medicine

## 2014-04-19 VITALS — BP 122/80 | HR 77 | Temp 98.1°F | Resp 16 | Ht 64.25 in | Wt 192.0 lb

## 2014-04-19 DIAGNOSIS — M25569 Pain in unspecified knee: Secondary | ICD-10-CM

## 2014-04-19 DIAGNOSIS — Z7189 Other specified counseling: Secondary | ICD-10-CM

## 2014-04-19 DIAGNOSIS — G5600 Carpal tunnel syndrome, unspecified upper limb: Secondary | ICD-10-CM

## 2014-04-19 DIAGNOSIS — M5430 Sciatica, unspecified side: Secondary | ICD-10-CM

## 2014-04-19 MED ORDER — METHOCARBAMOL 750 MG PO TABS: 750.0000 mg | ORAL_TABLET | Freq: Four times a day (QID) | ORAL | Status: DC

## 2014-04-19 NOTE — Progress Notes (Signed)
   Subjective:    Patient ID: Carmen Brown, female    DOB: 14-Dec-1940, 73 y.o.   MRN: [id number]  HPI Patient is being seen at [facility]. 1. Hand pain. Patient was sent to Dr. Modesto Charon at [facility]. 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 2. Knee Pain. Patient made a appointment with Dr. Thomasena Edis but was seen with the PA. Patient was given Corticosteroid shot in both knee. 3. Hip Pain. Right side of the hip. The Corticosteroid shot helped.  Need FMLA forms to be filled out.     Review of Systems     Objective:   Physical Exam  Constitutional: She is oriented to person, place, and time. She appears well-developed and well-nourished. No distress.  HENT:  Head: Normocephalic.  Eyes: Pupils are equal, round, and reactive to light.  Neck: Normal range of motion.  Cardiovascular: Normal rate.   Pulmonary/Chest: Effort normal.  Musculoskeletal: She exhibits tenderness.  Neurological: She is alert and oriented to person, place, and time. She exhibits normal muscle tone. Coordination normal.  Psychiatric: She has a normal mood and affect. Her behavior is normal. Judgment and thought content normal.          Assessment & Plan:  FMLA papers 2 sets filled out Robaxin for sciatic and back exercises Continue with Dr. Collins/Gramig/Hewitt

## 2014-04-19 NOTE — Patient Instructions (Signed)
Arthritis, Nonspecific °Arthritis is inflammation of a joint. This usually means pain, redness, warmth or swelling are present. One or more joints may be involved. There are a number of types of arthritis. Your caregiver may not be able to tell what type of arthritis you have right away. °CAUSES  °The most common cause of arthritis is the wear and tear on the joint (osteoarthritis). This causes damage to the cartilage, which can break down over time. The knees, hips, back and neck are most often affected by this type of arthritis. °Other types of arthritis and common causes of joint pain include: °· Sprains and other injuries near the joint. Sometimes minor sprains and injuries cause pain and swelling that develop hours later. °· Rheumatoid arthritis. This affects hands, feet and knees. It usually affects both sides of your body at the same time. It is often associated with chronic ailments, fever, weight loss and general weakness. °· Crystal arthritis. Gout and pseudo gout can cause occasional acute severe pain, redness and swelling in the foot, ankle, or knee. °· Infectious arthritis. Bacteria can get into a joint through a break in overlying skin. This can cause infection of the joint. Bacteria and viruses can also spread through the blood and affect your joints. °· Drug, infectious and allergy reactions. Sometimes joints can become mildly painful and slightly swollen with these types of illnesses. °SYMPTOMS  °· Pain is the main symptom. °· Your joint or joints can also be red, swollen and warm or hot to the touch. °· You may have a fever with certain types of arthritis, or even feel overall ill. °· The joint with arthritis will hurt with movement. Stiffness is present with some types of arthritis. °DIAGNOSIS  °Your caregiver will suspect arthritis based on your description of your symptoms and on your exam. Testing may be needed to find the type of arthritis: °· Blood and sometimes urine tests. °· X-ray tests  and sometimes CT or MRI scans. °· Removal of fluid from the joint (arthrocentesis) is done to check for bacteria, crystals or other causes. Your caregiver (or a specialist) will numb the area over the joint with a local anesthetic, and use a needle to remove joint fluid for examination. This procedure is only minimally uncomfortable. °· Even with these tests, your caregiver may not be able to tell what kind of arthritis you have. Consultation with a specialist (rheumatologist) may be helpful. °TREATMENT  °Your caregiver will discuss with you treatment specific to your type of arthritis. If the specific type cannot be determined, then the following general recommendations may apply. °Treatment of severe joint pain includes: °· Rest. °· Elevation. °· Anti-inflammatory medication (for example, ibuprofen) may be prescribed. Avoiding activities that cause increased pain. °· Only take over-the-counter or prescription medicines for pain and discomfort as recommended by your caregiver. °· Cold packs over an inflamed joint may be used for 10 to 15 minutes every hour. Hot packs sometimes feel better, but do not use overnight. Do not use hot packs if you are diabetic without your caregiver's permission. °· A cortisone shot into arthritic joints may help reduce pain and swelling. °· Any acute arthritis that gets worse over the next 1 to 2 days needs to be looked at to be sure there is no joint infection. °Long-term arthritis treatment involves modifying activities and lifestyle to reduce joint stress jarring. This can include weight loss. Also, exercise is needed to nourish the joint cartilage and remove waste. This helps keep the muscles   around the joint strong. °HOME CARE INSTRUCTIONS  °· Do not take aspirin to relieve pain if gout is suspected. This elevates uric acid levels. °· Only take over-the-counter or prescription medicines for pain, discomfort or fever as directed by your caregiver. °· Rest the joint as much as  possible. °· If your joint is swollen, keep it elevated. °· Use crutches if the painful joint is in your leg. °· Drinking plenty of fluids may help for certain types of arthritis. °· Follow your caregiver's dietary instructions. °· Try low-impact exercise such as: °¨ Swimming. °¨ Water aerobics. °¨ Biking. °¨ Walking. °· Morning stiffness is often relieved by a warm shower. °· Put your joints through regular range-of-motion. °SEEK MEDICAL CARE IF:  °· You do not feel better in 24 hours or are getting worse. °· You have side effects to medications, or are not getting better with treatment. °SEEK IMMEDIATE MEDICAL CARE IF:  °· You have a fever. °· You develop severe joint pain, swelling or redness. °· Many joints are involved and become painful and swollen. °· There is severe back pain and/or leg weakness. °· You have loss of bowel or bladder control. °Document Released: 09/08/2004 Document Revised: 10/24/2011 Document Reviewed: 09/24/2008 °ExitCare® Patient Information ©2015 Exit[employer], LLC. This information is not intended to replace advice given to you by your health care provider. Make sure you discuss any questions you have with your health care provider. ° °

## 2014-04-29 ENCOUNTER — Ambulatory Visit (INDEPENDENT_AMBULATORY_CARE_PROVIDER_SITE_OTHER): Payer: BC Managed Care – PPO | Admitting: Family Medicine

## 2014-04-29 VITALS — HR 85 | Temp 98.5°F | Resp 18 | Wt 190.2 lb

## 2014-04-29 DIAGNOSIS — R350 Frequency of micturition: Secondary | ICD-10-CM

## 2014-04-29 DIAGNOSIS — R300 Dysuria: Secondary | ICD-10-CM

## 2014-04-29 LAB — POCT UA - MICROSCOPIC ONLY
Bacteria, U Microscopic: NEGATIVE
Casts, Ur, LPF, POC: NEGATIVE
Crystals, Ur, HPF, POC: NEGATIVE
Mucus, UA: NEGATIVE
YEAST UA: NEGATIVE

## 2014-04-29 LAB — POCT URINALYSIS DIPSTICK
BILIRUBIN UA: NEGATIVE
Glucose, UA: NEGATIVE
Ketones, UA: NEGATIVE
Nitrite, UA: NEGATIVE
PH UA: 5.5
Protein, UA: NEGATIVE
Spec Grav, UA: 1.01
Urobilinogen, UA: 0.2

## 2014-04-29 MED ORDER — CIPROFLOXACIN HCL 500 MG PO TABS: 500.0000 mg | ORAL_TABLET | Freq: Two times a day (BID) | ORAL | Status: DC

## 2014-04-29 NOTE — Patient Instructions (Signed)

## 2014-04-29 NOTE — Progress Notes (Signed)
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  She just got back from [place].  She thinks she may have a UTI.  She is going more frequently and feels bloated.  No dysuria or hematuria.  Objective:  NAD   Results for orders placed in visit on 04/29/14  POCT UA - MICROSCOPIC ONLY      Result Value Ref Range   WBC, Ur, HPF, POC 3-7     RBC, urine, microscopic 0-1     Bacteria, U Microscopic neg     Mucus, UA neg     Epithelial cells, urine per micros 0-1     Crystals, Ur, HPF, POC neg     Casts, Ur, LPF, POC neg     Yeast, UA neg    POCT URINALYSIS DIPSTICK      Result Value Ref Range   Color, UA yellow     Clarity, UA clear     Glucose, UA neg     Bilirubin, UA neg     Ketones, UA neg     Spec Grav, UA 1.010     Blood, UA small     pH, UA 5.5     Protein, UA neg     Urobilinogen, UA 0.2     Nitrite, UA neg     Leukocytes, UA small (1+)     Dysuria - Plan: POCT UA - Microscopic Only, POCT urinalysis dipstick, ciprofloxacin (CIPRO) 500 MG tablet, Urine culture  Urinary frequency - Plan: ciprofloxacin (CIPRO) 500 MG tablet, Urine culture  Signed, Elvina Sidle, MD

## 2014-05-01 ENCOUNTER — Telehealth: Payer: Self-pay

## 2014-05-01 LAB — URINE CULTURE
Colony Count: NO GROWTH
Organism ID, Bacteria: NO GROWTH

## 2014-05-01 NOTE — Telephone Encounter (Signed)
Patient was seen on Tuesday in our walk in clinic for a UTI and was given "cipro". Patient started it on Tuesday and will be finished with it on this Sunday. Per patient she feels "jittery" and thinks she needs a different medication. I asked patient if she felt it was a side effect from the Cipro and she said no. Patient said it was a normal symptom that people get from a UTI. Patient uses [facility] on [address]. Patients call back number is [phone]

## 2014-05-03 NOTE — Telephone Encounter (Signed)
Pt is feeling better today. Let her know that her urine culture was negative and to call back if she has any other problems.

## 2014-05-29 ENCOUNTER — Telehealth: Payer: Self-pay | Admitting: Internal Medicine

## 2014-05-29 ENCOUNTER — Ambulatory Visit (INDEPENDENT_AMBULATORY_CARE_PROVIDER_SITE_OTHER): Payer: BC Managed Care – PPO

## 2014-05-29 ENCOUNTER — Ambulatory Visit (INDEPENDENT_AMBULATORY_CARE_PROVIDER_SITE_OTHER): Payer: BC Managed Care – PPO | Admitting: Family Medicine

## 2014-05-29 VITALS — BP 132/90 | HR 84 | Temp 98.1°F | Resp 16 | Ht 64.0 in | Wt 191.0 lb

## 2014-05-29 DIAGNOSIS — I4729 Other ventricular tachycardia: Secondary | ICD-10-CM

## 2014-05-29 DIAGNOSIS — I517 Cardiomegaly: Secondary | ICD-10-CM

## 2014-05-29 DIAGNOSIS — I472 Ventricular tachycardia: Secondary | ICD-10-CM

## 2014-05-29 DIAGNOSIS — J069 Acute upper respiratory infection, unspecified: Secondary | ICD-10-CM

## 2014-05-29 MED ORDER — AMOXICILLIN-POT CLAVULANATE 875-125 MG PO TABS
1.0000 | ORAL_TABLET | Freq: Two times a day (BID) | ORAL | Status: DC
Start: 2014-05-29 — End: 2014-08-16

## 2014-05-29 MED ORDER — BENZONATATE 100 MG PO CAPS: 100.0000 mg | ORAL_CAPSULE | Freq: Three times a day (TID) | ORAL | Status: DC | PRN

## 2014-05-29 NOTE — Progress Notes (Signed)
Subjective:    Patient ID: Carmen Brown, female    DOB: 1941-05-12, 73 y.o.   MRN: [id number]  05/29/2014  Cough   Cough Associated symptoms include headaches and wheezing. Pertinent negatives include no chills, ear pain, fever, postnasal drip, rash, rhinorrhea, sore throat or shortness of breath.   This 73 y.o. female presents for evaluation of cold. Onset one week ago.  No fever but +sweats.  No chills.  +HA.  Significant pressure along maxillary sinuses.  No ST or ear pain.  +throat scratchy.  No rhinorrhea.  No nasal congestion.  Side is  sore from coughing.  No SOB.  No sputum production.  +wheezing.  No v/d.  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  No history of asthma.  No COPD or emphysema; no tobacco.  Taking Dayquil, Nyquil.  At airport, grabbed lozenges.  Also Mucinex one dose this morning liquid.     Review of Systems  Constitutional: Positive for diaphoresis. Negative for fever and chills.  HENT: Positive for sinus pressure. Negative for congestion, ear pain, postnasal drip, rhinorrhea and sore throat.   Respiratory: Positive for cough and wheezing. Negative for shortness of breath.   Gastrointestinal: Negative for nausea, vomiting and diarrhea.  Skin: Negative for rash.  Neurological: Positive for headaches.    Past Medical History  Diagnosis Date  . Paroxysmal VT   . Thyroid disease   . Cardiomyopathy   . Allergy   . Arthritis   . Heart murmur   . Chest pain   . Chest pressure   . Obesity   . Hypertension, benign     systemic  . Cardiomegaly   . Menopausal syndrome   . Osteoarthritis of lower leg, localized    Past Surgical History  Procedure Laterality Date  . Ventricular tacticartia    . Mass excision  03/15/2012    Procedure: MINOR EXCISION OF MASS;  Surgeon: Wyn Forsterobert V Sypher Jr., MD;  Location: [facility];  Service: Orthopedics;  Laterality: Right;  debride IP right long finger, excision mucoid cyst   Allergies  Allergen  Reactions  . Epinephrine Other (See Comments)    unknown  . Latex     rash   Current Outpatient Prescriptions  Medication Sig Dispense Refill  . aspirin 81 MG tablet Take 81 mg by mouth daily.        . Cholecalciferol (VITAMIN D3) 2000 UNIT TABS Take 1 tablet by mouth daily.       . Coenzyme Q10 (CO Q 10 PO) Take 50 mg by mouth daily.       . fluticasone (FLONASE) 50 MCG/ACT nasal spray PLACE 2 SPRAYS INTO THE NOSE DAILY.  16 g  7  . Glucosamine Sulfate 750 MG CAPS Take 2 capsules by mouth daily.      Marland Kitchen. lisinopril (PRINIVIL,ZESTRIL) 20 MG tablet Take 1 tablet (20 mg total) by mouth daily.  90 tablet  3  . metoprolol succinate (TOPROL-XL) 25 MG 24 hr tablet Take 1 tablet (25 mg total) by mouth daily.  90 tablet  3  . Multiple Vitamin (MULTIVITAMIN) capsule Take 1 capsule by mouth 2 (two) times daily.        . NON FORMULARY (Suprema Dophilus) Take one capsule once a day      . omega-3 acid ethyl esters (LOVAZA) 1 G capsule Take 1 g by mouth 2 (two) times daily.      Marland Kitchen. amoxicillin-clavulanate (AUGMENTIN) 875-125 MG per tablet Take 1 tablet  by mouth 2 (two) times daily.  20 tablet  0  . benzonatate (TESSALON) 100 MG capsule Take 1-2 capsules (100-200 mg total) by mouth 3 (three) times daily as needed for cough.  40 capsule  0   No current facility-administered medications for this visit.       Objective:    BP 132/90  Pulse 84  Temp(Src) 98.1 F (36.7 C) (Oral)  Resp 16  Ht 5\' 4"  (1.626 m)  Wt 191 lb (86.637 kg)  BMI 32.77 kg/m2  SpO2 96% Physical Exam  Nursing note and vitals reviewed. Constitutional: She is oriented to person, place, and time. She appears well-developed and well-nourished. No distress.  HENT:  Head: Normocephalic and atraumatic.  Right Ear: External ear normal.  Left Ear: External ear normal.  Nose: Nose normal.  Mouth/Throat: Oropharynx is clear and moist.  Eyes: Conjunctivae are normal. Pupils are equal, round, and reactive to light.  Neck: Normal range  of motion. Neck supple.  Cardiovascular: Normal rate, regular rhythm and normal heart sounds.  Exam reveals no gallop and no friction rub.   No murmur heard. Pulmonary/Chest: Effort normal and breath sounds normal. She has no wheezes. She has no rales.  Lymphadenopathy:    She has cervical adenopathy.  Neurological: She is alert and oriented to person, place, and time.  Skin: No rash noted. She is not diaphoretic.  Psychiatric: She has a normal mood and affect. Her behavior is normal.    [facility] reading (PRIMARY) by  Dr. Katrinka BlazingSmith.  CXR: NAD; calcified aorta?      Assessment & Plan:   1. Acute upper respiratory infection   2. Cardiomegaly   3. Paroxysmal ventricular tachycardia     1. Acute URI:  New. Flight attendant with frequent flying.  Treat with Augmentin due to heart disease, Tessalon Perles; continue Mucinex as prescribed. 2. CM: stable; asymptomatic.  No evidence of volume overload. 3.  Paroxysmal V. Tach: stable; asymptomatic.   Meds ordered this encounter  Medications  . amoxicillin-clavulanate (AUGMENTIN) 875-125 MG per tablet    Sig: Take 1 tablet by mouth 2 (two) times daily.    Dispense:  20 tablet    Refill:  0  . benzonatate (TESSALON) 100 MG capsule    Sig: Take 1-2 capsules (100-200 mg total) by mouth 3 (three) times daily as needed for cough.    Dispense:  40 capsule    Refill:  0    No Follow-up on file.    Nilda SimmerKristi Smith, M.D.  [facility]  [place] [address] [place], [place]  [id number] [phone] phone [phone] fax

## 2014-05-29 NOTE — Telephone Encounter (Addendum)
Patient states that Dr. [name] completePerrin Maltesed FMLA ppw for her during her last OV with him in September. States that her employer needs more explanation about a start and end date for her leave. This should be on a letterhead she states. FMLA ppw did not go through correct process and there is no copy scanned on file. Patient states that she may be able to bring the original paperwork in or fax it if Dr. Perrin MalteseGuest needs to review it. Please give letter to FMLA/Disabilities department when complete. Also, states she needs this by next Wed. Will come to office to pick up.

## 2014-05-29 NOTE — Patient Instructions (Signed)
Upper Respiratory Infection, Adult An upper respiratory infection (URI) is also sometimes known as the common cold. The upper respiratory tract includes the nose, sinuses, throat, trachea, and bronchi. Bronchi are the airways leading to the lungs. Most people improve within 1 week, but symptoms can last up to 2 weeks. A residual cough may last even longer.  CAUSES Many different viruses can infect the tissues lining the upper respiratory tract. The tissues become irritated and inflamed and often become very moist. Mucus production is also common. A cold is contagious. You can easily spread the virus to others by oral contact. This includes kissing, sharing a glass, coughing, or sneezing. Touching your mouth or nose and then touching a surface, which is then touched by another person, can also spread the virus. SYMPTOMS  Symptoms typically develop 1 to 3 days after you come in contact with a cold virus. Symptoms vary from person to person. They may include:  Runny nose.  Sneezing.  Nasal congestion.  Sinus irritation.  Sore throat.  Loss of voice (laryngitis).  Cough.  Fatigue.  Muscle aches.  Loss of appetite.  Headache.  Low-grade fever. DIAGNOSIS  You might diagnose your own cold based on familiar symptoms, since most people get a cold 2 to 3 times a year. Your caregiver can confirm this based on your exam. Most importantly, your caregiver can check that your symptoms are not due to another disease such as strep throat, sinusitis, pneumonia, asthma, or epiglottitis. Blood tests, throat tests, and X-rays are not necessary to diagnose a common cold, but they may sometimes be helpful in excluding other more serious diseases. Your caregiver will decide if any further tests are required. RISKS AND COMPLICATIONS  You may be at risk for a more severe case of the common cold if you smoke cigarettes, have chronic heart disease (such as heart failure) or lung disease (such as asthma), or if  you have a weakened immune system. The very young and very old are also at risk for more serious infections. Bacterial sinusitis, middle ear infections, and bacterial pneumonia can complicate the common cold. The common cold can worsen asthma and chronic obstructive pulmonary disease (COPD). Sometimes, these complications can require emergency medical care and may be life-threatening. PREVENTION  The best way to protect against getting a cold is to practice good hygiene. Avoid oral or hand contact with people with cold symptoms. Wash your hands often if contact occurs. There is no clear evidence that vitamin C, vitamin E, echinacea, or exercise reduces the chance of developing a cold. However, it is always recommended to get plenty of rest and practice good nutrition. TREATMENT  Treatment is directed at relieving symptoms. There is no cure. Antibiotics are not effective, because the infection is caused by a virus, not by bacteria. Treatment may include:  Increased fluid intake. Sports drinks offer valuable electrolytes, sugars, and fluids.  Breathing heated mist or steam (vaporizer or shower).  Eating chicken soup or other clear broths, and maintaining good nutrition.  Getting plenty of rest.  Using gargles or lozenges for comfort.  Controlling fevers with ibuprofen or acetaminophen as directed by your caregiver.  Increasing usage of your inhaler if you have asthma. Zinc gel and zinc lozenges, taken in the first 24 hours of the common cold, can shorten the duration and lessen the severity of symptoms. Pain medicines may help with fever, muscle aches, and throat pain. A variety of non-prescription medicines are available to treat congestion and runny nose. Your caregiver   can make recommendations and may suggest nasal or lung inhalers for other symptoms.  HOME CARE INSTRUCTIONS   Only take over-the-counter or prescription medicines for pain, discomfort, or fever as directed by your  caregiver.  Use a warm mist humidifier or inhale steam from a shower to increase air moisture. This may keep secretions moist and make it easier to breathe.  Drink enough water and fluids to keep your urine clear or pale yellow.  Rest as needed.  Return to work when your temperature has returned to normal or as your caregiver advises. You may need to stay home longer to avoid infecting others. You can also use a face mask and careful hand washing to prevent spread of the virus. SEEK MEDICAL CARE IF:   After the first few days, you feel you are getting worse rather than better.  You need your caregiver's advice about medicines to control symptoms.  You develop chills, worsening shortness of breath, or brown or red sputum. These may be signs of pneumonia.  You develop yellow or brown nasal discharge or pain in the face, especially when you bend forward. These may be signs of sinusitis.  You develop a fever, swollen neck glands, pain with swallowing, or white areas in the back of your throat. These may be signs of strep throat. SEEK IMMEDIATE MEDICAL CARE IF:   You have a fever.  You develop severe or persistent headache, ear pain, sinus pain, or chest pain.  You develop wheezing, a prolonged cough, cough up blood, or have a change in your usual mucus (if you have chronic lung disease).  You develop sore muscles or a stiff neck. Document Released: 01/25/2001 Document Revised: 10/24/2011 Document Reviewed: 11/06/2013 Exit[employer] Patient Information 2015 [employer]. This information is not intended to replace advice given to you by your health care provider. Make sure you discuss any questions you have with your health care provider.  

## 2014-05-30 NOTE — Telephone Encounter (Signed)
Patient dropped off FMLA forms for Dr. Perrin MalteseGuest to review and write letter. Placed in Dr. Ernestene MentionGuest's box.

## 2014-06-04 ENCOUNTER — Other Ambulatory Visit: Payer: Self-pay | Admitting: Family Medicine

## 2014-06-05 NOTE — Telephone Encounter (Signed)
Dr Katrinka BlazingSmith, do you want to give pt a RF or RTC for recheck?

## 2014-06-13 NOTE — Telephone Encounter (Signed)
Where are we at with this? Patient has left a message on VM inquiring about the status of her paperwork.

## 2014-06-14 NOTE — Telephone Encounter (Signed)
Dr. Perrin MalteseGuest, please review. Thanks

## 2014-06-19 NOTE — Telephone Encounter (Signed)
Have Carmen Brown return to clinic and see me to review with me what needs to be changed on her paperwork. I cannot guess what they want.

## 2014-06-19 NOTE — Telephone Encounter (Signed)
Spoke with pt. She just needed her previous form to be more specific (see original phone message). It had to be turned back in on 10/25. She will have to bring us a new form from her employer. Will do this as soon as she can.

## 2014-07-03 ENCOUNTER — Telehealth: Payer: Self-pay

## 2014-07-03 NOTE — Telephone Encounter (Signed)
Pt's FMLA ppw has been scanned into the media files, and faxed to [employer]

## 2014-07-22 ENCOUNTER — Ambulatory Visit (INDEPENDENT_AMBULATORY_CARE_PROVIDER_SITE_OTHER): Payer: BC Managed Care – PPO | Admitting: Internal Medicine

## 2014-07-22 VITALS — BP 130/84 | HR 87 | Temp 98.1°F | Resp 18 | Ht 64.0 in | Wt 195.0 lb

## 2014-07-22 DIAGNOSIS — I10 Essential (primary) hypertension: Secondary | ICD-10-CM

## 2014-07-22 DIAGNOSIS — N390 Urinary tract infection, site not specified: Secondary | ICD-10-CM

## 2014-07-22 DIAGNOSIS — Z79899 Other long term (current) drug therapy: Secondary | ICD-10-CM

## 2014-07-22 DIAGNOSIS — M545 Low back pain, unspecified: Secondary | ICD-10-CM

## 2014-07-22 DIAGNOSIS — Z23 Encounter for immunization: Secondary | ICD-10-CM

## 2014-07-22 LAB — COMPREHENSIVE METABOLIC PANEL
ALBUMIN: 4.2 g/dL (ref 3.5–5.2)
ALT: 38 U/L — ABNORMAL HIGH (ref 0–35)
AST: 30 U/L (ref 0–37)
Alkaline Phosphatase: 57 U/L (ref 39–117)
BUN: 17 mg/dL (ref 6–23)
CALCIUM: 10.2 mg/dL (ref 8.4–10.5)
CHLORIDE: 100 meq/L (ref 96–112)
CO2: 30 mEq/L (ref 19–32)
Creat: 1.05 mg/dL (ref 0.50–1.10)
GLUCOSE: 94 mg/dL (ref 70–99)
POTASSIUM: 4.9 meq/L (ref 3.5–5.3)
SODIUM: 140 meq/L (ref 135–145)
TOTAL PROTEIN: 7.7 g/dL (ref 6.0–8.3)
Total Bilirubin: 0.5 mg/dL (ref 0.2–1.2)

## 2014-07-22 LAB — POCT UA - MICROSCOPIC ONLY
Bacteria, U Microscopic: NEGATIVE
Casts, Ur, LPF, POC: NEGATIVE
Crystals, Ur, HPF, POC: NEGATIVE
Mucus, UA: NEGATIVE
YEAST UA: NEGATIVE

## 2014-07-22 LAB — POCT CBC
GRANULOCYTE PERCENT: 64.3 % (ref 37–80)
HEMATOCRIT: 44.9 % (ref 37.7–47.9)
Hemoglobin: 15 g/dL (ref 12.2–16.2)
Lymph, poc: 2.1 (ref 0.6–3.4)
MCH, POC: 33.9 pg — AB (ref 27–31.2)
MCHC: 33.4 g/dL (ref 31.8–35.4)
MCV: 101.4 fL — AB (ref 80–97)
MID (cbc): 0.6 (ref 0–0.9)
MPV: 8.5 fL (ref 0–99.8)
POC Granulocyte: 4.8 (ref 2–6.9)
POC LYMPH %: 27.7 % (ref 10–50)
POC MID %: 8 %M (ref 0–12)
Platelet Count, POC: 212 10*3/uL (ref 142–424)
RBC: 4.43 M/uL (ref 4.04–5.48)
RDW, POC: 14.5 %
WBC: 7.5 10*3/uL (ref 4.6–10.2)

## 2014-07-22 LAB — POCT URINALYSIS DIPSTICK
BILIRUBIN UA: NEGATIVE
Blood, UA: NEGATIVE
Glucose, UA: NEGATIVE
Ketones, UA: NEGATIVE
Nitrite, UA: NEGATIVE
PROTEIN UA: NEGATIVE
Spec Grav, UA: 1.025
Urobilinogen, UA: 0.2
pH, UA: 6.5

## 2014-07-22 LAB — POCT SEDIMENTATION RATE: POCT SED RATE: 25 mm/hr — AB (ref 0–22)

## 2014-07-22 MED ORDER — METHOCARBAMOL 750 MG PO TABS: 750.0000 mg | ORAL_TABLET | Freq: Four times a day (QID) | ORAL | Status: DC

## 2014-07-22 MED ORDER — CIPROFLOXACIN HCL 500 MG PO TABS: 500.0000 mg | ORAL_TABLET | Freq: Two times a day (BID) | ORAL | Status: DC

## 2014-07-22 MED ORDER — HYDROCODONE-ACETAMINOPHEN 5-325 MG PO TABS
1.0000 | ORAL_TABLET | Freq: Four times a day (QID) | ORAL | Status: DC | PRN
Start: 2014-07-22 — End: 2015-03-06

## 2014-07-22 NOTE — Patient Instructions (Addendum)
Back Exercises These exercises may help you when beginning to rehabilitate your injury. Your symptoms may resolve with or without further involvement from your physician, physical therapist or athletic trainer. While completing these exercises, remember:   Restoring tissue flexibility helps normal motion to return to the joints. This allows healthier, less painful movement and activity.  An effective stretch should be held for at least 30 seconds.  A stretch should never be painful. You should only feel a gentle lengthening or release in the stretched tissue. STRETCH - Extension, Prone on Elbows   Lie on your stomach on the floor, a bed will be too soft. Place your palms about shoulder width apart and at the height of your head.  Place your elbows under your shoulders. If this is too painful, stack pillows under your chest.  Allow your body to relax so that your hips drop lower and make contact more completely with the floor.  Hold this position for __________ seconds.  Slowly return to lying flat on the floor. Repeat __________ times. Complete this exercise __________ times per day.  RANGE OF MOTION - Extension, Prone Press Ups   Lie on your stomach on the floor, a bed will be too soft. Place your palms about shoulder width apart and at the height of your head.  Keeping your back as relaxed as possible, slowly straighten your elbows while keeping your hips on the floor. You may adjust the placement of your hands to maximize your comfort. As you gain motion, your hands will come more underneath your shoulders.  Hold this position __________ seconds.  Slowly return to lying flat on the floor. Repeat __________ times. Complete this exercise __________ times per day.  RANGE OF MOTION- Quadruped, Neutral Spine   Assume a hands and knees position on a firm surface. Keep your hands under your shoulders and your knees under your hips. You may place padding under your knees for  comfort.  Drop your head and point your tail bone toward the ground below you. This will round out your low back like an angry cat. Hold this position for __________ seconds.  Slowly lift your head and release your tail bone so that your back sags into a large arch, like an old horse.  Hold this position for __________ seconds.  Repeat this until you feel limber in your low back.  Now, find your "sweet spot." This will be the most comfortable position somewhere between the two previous positions. This is your neutral spine. Once you have found this position, tense your stomach muscles to support your low back.  Hold this position for __________ seconds. Repeat __________ times. Complete this exercise __________ times per day.  STRETCH - Flexion, Single Knee to Chest   Lie on a firm bed or floor with both legs extended in front of you.  Keeping one leg in contact with the floor, bring your opposite knee to your chest. Hold your leg in place by either grabbing behind your thigh or at your knee.  Pull until you feel a gentle stretch in your low back. Hold __________ seconds.  Slowly release your grasp and repeat the exercise with the opposite side. Repeat __________ times. Complete this exercise __________ times per day.  STRETCH - Hamstrings, Standing  Stand or sit and extend your right / left leg, placing your foot on a chair or foot stool  Keeping a slight arch in your low back and your hips straight forward.  Lead with your chest and   lean forward at the waist until you feel a gentle stretch in the back of your right / left knee or thigh. (When done correctly, this exercise requires leaning only a small distance.)  Hold this position for __________ seconds. Repeat __________ times. Complete this stretch __________ times per day. STRENGTHENING - Deep Abdominals, Pelvic Tilt   Lie on a firm bed or floor. Keeping your legs in front of you, bend your knees so they are both pointed  toward the ceiling and your feet are flat on the floor.  Tense your lower abdominal muscles to press your low back into the floor. This motion will rotate your pelvis so that your tail bone is scooping upwards rather than pointing at your feet or into the floor.  With a gentle tension and even breathing, hold this position for __________ seconds. Repeat __________ times. Complete this exercise __________ times per day.  STRENGTHENING - Abdominals, Crunches   Lie on a firm bed or floor. Keeping your legs in front of you, bend your knees so they are both pointed toward the ceiling and your feet are flat on the floor. Cross your arms over your chest.  Slightly tip your chin down without bending your neck.  Tense your abdominals and slowly lift your trunk high enough to just clear your shoulder blades. Lifting higher can put excessive stress on the low back and does not further strengthen your abdominal muscles.  Control your return to the starting position. Repeat __________ times. Complete this exercise __________ times per day.  STRENGTHENING - Quadruped, Opposite UE/LE Lift   Assume a hands and knees position on a firm surface. Keep your hands under your shoulders and your knees under your hips. You may place padding under your knees for comfort.  Find your neutral spine and gently tense your abdominal muscles so that you can maintain this position. Your shoulders and hips should form a rectangle that is parallel with the floor and is not twisted.  Keeping your trunk steady, lift your right hand no higher than your shoulder and then your left leg no higher than your hip. Make sure you are not holding your breath. Hold this position __________ seconds.  Continuing to keep your abdominal muscles tense and your back steady, slowly return to your starting position. Repeat with the opposite arm and leg. Repeat __________ times. Complete this exercise __________ times per day. Document Released:  08/19/2005 Document Revised: 10/24/2011 Document Reviewed: 11/13/2008 [facility] Patient Information 2015 [place] [place]. This information is not intended to replace advice given to you by your health care provider. Make sure you discuss any questions you have with your health care Urinary Tract Infection Urinary tract infections (UTIs) can develop anywhere along your urinary tract. Your urinary tract is your body's drainage system for removing wastes and extra water. Your urinary tract includes two kidneys, two ureters, a bladder, and a urethra. Your kidneys are a pair of bean-shaped organs. Each kidney is about the size of your fist. They are located below your ribs, one on each side of your spine. CAUSES Infections are caused by microbes, which are microscopic organisms, including fungi, viruses, and bacteria. These organisms are so small that they can only be seen through a microscope. Bacteria are the microbes that most commonly cause UTIs. SYMPTOMS  Symptoms of UTIs may vary by age and gender of the patient and by the location of the infection. Symptoms in young women typically include a frequent and intense urge to urinate and a  painful, burning feeling in the bladder or urethra during urination. Older women and men are more likely to be tired, shaky, and weak and have muscle aches and abdominal pain. A fever may mean the infection is in your kidneys. Other symptoms of a kidney infection include pain in your back or sides below the ribs, nausea, and vomiting. DIAGNOSIS To diagnose a UTI, your caregiver will ask you about your symptoms. Your caregiver also will ask to provide a urine sample. The urine sample will be tested for bacteria and white blood cells. White blood cells are made by your body to help fight infection. TREATMENT  Typically, UTIs can be treated with medication. Because most UTIs are caused by a bacterial infection, they usually can be treated with the use of antibiotics. The choice  of antibiotic and length of treatment depend on your symptoms and the type of bacteria causing your infection. HOME CARE INSTRUCTIONS  If you were prescribed antibiotics, take them exactly as your caregiver instructs you. Finish the medication even if you feel better after you have only taken some of the medication.  Drink enough water and fluids to keep your urine clear or pale yellow.  Avoid caffeine, tea, and carbonated beverages. They tend to irritate your bladder.  Empty your bladder often. Avoid holding urine for long periods of time.  Empty your bladder before and after sexual intercourse.  After a bowel movement, women should cleanse from front to back. Use each tissue only once. SEEK MEDICAL CARE IF:   You have back pain.  You develop a fever.  Your symptoms do not begin to resolve within 3 days. SEEK IMMEDIATE MEDICAL CARE IF:   You have severe back pain or lower abdominal pain.  You develop chills.  You have nausea or vomiting.  You have continued burning or discomfort with urination. MAKE SURE YOU:   Understand these instructions.  Will watch your condition.  Will get help right away if you are not doing well or get worse. Document Released: 05/11/2005 Document Revised: 01/31/2012 Document Reviewed: 09/09/2011 [facility] Patient Information 2015 [place], [place]. This information is not intended to replace advice given to you by your health care provider. Make sure you discuss any questions you have with your health care provider. provider.

## 2014-07-22 NOTE — Progress Notes (Signed)
Subjective:    Patient ID: Carmen Brown, female    DOB: 1941/02/22, 73 y.o.   MRN: [id number]  HPI  [age] y/o female c/o low back pain, left sided x 2 weeks. Denies any  lifting or moving of objects to cause this pain. She has been more active than normal during her vacation. She has been doing more yard and house work over the past 3.5 weeks.  Denies dysuria, constipation, diarrhea, abdominal pain, no fever, bowel movements are regular and normal.  She works as a Financial controllerflight attendant.   Experiencing a recurrent cough, tessalon pearls resolved cough. Cough began in October, given tessalon pearls, cough resolved, cough returned  2-3 weeks later: tessalon pearls were refilled and cough resolved. Cough has since returned.   Needing FMLA paper completed: she has the information needed to complete at this time.       Review of Systems     Objective:   Physical Exam  Constitutional: She is oriented to person, place, and time. She appears well-developed and well-nourished. No distress.  HENT:  Head: Normocephalic.  Mouth/Throat: Oropharynx is clear and moist.  Eyes: EOM are normal. Pupils are equal, round, and reactive to light.  Neck: Normal range of motion.  Cardiovascular: Normal rate.   Pulmonary/Chest: Effort normal and breath sounds normal.  Musculoskeletal: She exhibits tenderness.       Lumbar back: She exhibits tenderness, bony tenderness, pain and spasm. She exhibits normal range of motion, no swelling, no edema, no deformity and normal pulse.  Neurological: She is alert and oriented to person, place, and time. She has normal strength and normal reflexes. No cranial nerve deficit or sensory deficit. She exhibits normal muscle tone. Coordination and gait normal.  Skin: Skin is intact. No rash noted. No erythema.  Psychiatric: She has a normal mood and affect. Her behavior is normal. Judgment and thought content normal.    LS spine xrays 5/15 reviewed  Results for orders placed  or performed in visit on 07/22/14  POCT UA - Microscopic Only  Result Value Ref Range   WBC, Ur, HPF, POC 3-7    RBC, urine, microscopic 1-7    Bacteria, U Microscopic neg    Mucus, UA neg    Epithelial cells, urine per micros 3-4    Crystals, Ur, HPF, POC neg    Casts, Ur, LPF, POC neg    Yeast, UA neg   POCT urinalysis dipstick  Result Value Ref Range   Color, UA yellow    Clarity, UA clear    Glucose, UA neg    Bilirubin, UA neg    Ketones, UA neg    Spec Grav, UA 1.025    Blood, UA neg    pH, UA 6.5    Protein, UA neg    Urobilinogen, UA 0.2    Nitrite, UA neg    Leukocytes, UA Trace   POCT CBC  Result Value Ref Range   WBC 7.5 4.6 - 10.2 K/uL   Lymph, poc 2.1 0.6 - 3.4   POC LYMPH PERCENT 27.7 10 - 50 %L   MID (cbc) 0.6 0 - 0.9   POC MID % 8.0 0 - 12 %M   POC Granulocyte 4.8 2 - 6.9   Granulocyte percent 64.3 37 - 80 %G   RBC 4.43 4.04 - 5.48 M/uL   Hemoglobin 15.0 12.2 - 16.2 g/dL   HCT, POC 40.944.9 81.137.7 - 47.9 %   MCV 101.4 (A) 80 - 97 fL  MCH, POC 33.9 (A) 27 - 31.2 pg   MCHC 33.4 31.8 - 35.4 g/dL   RDW, POC 81.114.5 %   Platelet Count, POC 212 142 - 424 K/uL   MPV 8.5 0 - 99.8 fL        Assessment & Plan:  LBP withL5-S1 DDD Counsel FMLA completed and faxed

## 2014-07-23 LAB — URINE CULTURE

## 2014-07-29 ENCOUNTER — Telehealth: Payer: Self-pay | Admitting: Family Medicine

## 2014-07-29 NOTE — Telephone Encounter (Signed)
Flu shot 07/22/14

## 2014-07-31 ENCOUNTER — Telehealth: Payer: Self-pay

## 2014-07-31 NOTE — Telephone Encounter (Signed)
Patient left voicemail on Wednesday at 2:51pm stating Dr. Perrin MalteseGuest wrote her a note for FMLA and it has not been received. She needs it refaxed to [phone]. She says the deadline was yesterday Dec16. Per her office note on December 8, the ppw was completed and faxed. Cb# [phone]

## 2014-08-16 ENCOUNTER — Ambulatory Visit (INDEPENDENT_AMBULATORY_CARE_PROVIDER_SITE_OTHER): Payer: BLUE CROSS/BLUE SHIELD | Admitting: Family Medicine

## 2014-08-16 VITALS — BP 144/90 | HR 79 | Temp 98.2°F | Resp 18 | Ht 65.0 in | Wt 198.0 lb

## 2014-08-16 DIAGNOSIS — R0981 Nasal congestion: Secondary | ICD-10-CM

## 2014-08-16 DIAGNOSIS — D7589 Other specified diseases of blood and blood-forming organs: Secondary | ICD-10-CM

## 2014-08-16 LAB — POCT CBC
GRANULOCYTE PERCENT: 59.4 % (ref 37–80)
HEMATOCRIT: 42.9 % (ref 37.7–47.9)
HEMOGLOBIN: 14.3 g/dL (ref 12.2–16.2)
LYMPH, POC: 2.2 (ref 0.6–3.4)
MCH, POC: 33.8 pg — AB (ref 27–31.2)
MCHC: 33.4 g/dL (ref 31.8–35.4)
MCV: 101.3 fL — AB (ref 80–97)
MID (cbc): 0.7 (ref 0–0.9)
MPV: 8.4 fL (ref 0–99.8)
POC GRANULOCYTE: 4.2 (ref 2–6.9)
POC LYMPH %: 31.2 % (ref 10–50)
POC MID %: 9.4 %M (ref 0–12)
Platelet Count, POC: 192 10*3/uL (ref 142–424)
RBC: 4.23 M/uL (ref 4.04–5.48)
RDW, POC: 14 %
WBC: 7.1 10*3/uL (ref 4.6–10.2)

## 2014-08-16 LAB — VITAMIN B12: Vitamin B-12: 569 pg/mL (ref 211–911)

## 2014-08-16 LAB — FOLATE

## 2014-08-16 NOTE — Progress Notes (Signed)
[facility] [address], [place] [address] [phone]  Date:  08/16/2014   Name:  Carmen Brown   DOB:  1941-08-02   MRN:  [id number]  PCP:  Tally Due, MD    Chief Complaint: Nasal Congestion   History of Present Illness:  Carmen Brown is a 74 y.o. very pleasant female patient who presents with the following:  This am she awoke "not feeling great"- she felt a little light headed and her nose was stuffy. No fever or chills.  No cough.  No sore throat or earache. 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  She has tried a nasal decongestant spray which did help her   No GI symptoms.    She also has some questions about some medications that she has in her bag- what are they for, etc?    Patient Active Problem List   Diagnosis Date Noted  . Chest pressure 11/05/2012  . PAROXYSMAL VENTRICULAR TACHYCARDIA 08/21/2009  . OBESITY, NOS 10/12/2006  . HYPERTENSION, BENIGN SYSTEMIC 10/12/2006  . CARDIOMEGALY 10/12/2006  . RHINITIS, ALLERGIC 10/12/2006  . MENOPAUSAL SYNDROME 10/12/2006  . OSTEOARTHRITIS, LOWER LEG 10/12/2006  . CERVICAL SPINE DISORDER, NOS 10/12/2006    Past Medical History  Diagnosis Date  . Paroxysmal VT   . Thyroid disease   . Cardiomyopathy   . Allergy   . Arthritis   . Heart murmur   . Chest pain   . Chest pressure   . Obesity   . Hypertension, benign     systemic  . Cardiomegaly   . Menopausal syndrome   . Osteoarthritis of lower leg, localized     Past Surgical History  Procedure Laterality Date  . Ventricular tacticartia    . Mass excision  03/15/2012    Procedure: MINOR EXCISION OF MASS;  Surgeon: Wyn Forster., MD;  Location: [facility];  Service: Orthopedics;  Laterality: Right;  debride IP right long finger, excision mucoid cyst    History  Substance Use Topics  . Smoking status: Never  Smoker   . Smokeless tobacco: Never Used  . Alcohol Use: Yes     Comment: occasional    Family History  Problem Relation Age of Onset  . Heart disease Mother   . Heart disease Father   . Stroke Maternal Grandmother   . Diabetes Maternal Grandfather     Allergies  Allergen Reactions  . Epinephrine Other (See Comments)    unknown  . Latex     rash    Medication list has been reviewed and updated.  Current Outpatient Prescriptions on File Prior to Visit  Medication Sig Dispense Refill  . amoxicillin-clavulanate (AUGMENTIN) 875-125 MG per tablet Take 1 tablet by mouth 2 (two) times daily. 20 tablet 0  . aspirin 81 MG tablet Take 81 mg by mouth daily.      . benzonatate (TESSALON) 100 MG capsule TAKE 1-2 CAPSULES BY MOUTH 3 TIMES DAILY AS NEEDED FOR COUGH. 60 capsule 0  . Biotin 10 MG TABS Take by mouth.    . Cholecalciferol (VITAMIN D3) 2000 UNIT TABS Take 1 tablet by mouth daily.     . ciprofloxacin (CIPRO) 500 MG tablet Take 1 tablet (500 mg total) by mouth 2 (two) times daily. 10 tablet 1  . Coenzyme Q10 (CO Q 10 PO) Take 50  mg by mouth daily.     . fluticasone (FLONASE) 50 MCG/ACT nasal spray PLACE 2 SPRAYS INTO THE NOSE DAILY. 16 g 7  . Glucosamine Sulfate 750 MG CAPS Take 2 capsules by mouth daily.    Marland Kitchen HYDROcodone-acetaminophen (NORCO/VICODIN) 5-325 MG per tablet Take 1 tablet by mouth every 6 (six) hours as needed. 30 tablet 0  . lisinopril (PRINIVIL,ZESTRIL) 20 MG tablet Take 1 tablet (20 mg total) by mouth daily. 90 tablet 3  . methocarbamol (ROBAXIN-750) 750 MG tablet Take 1 tablet (750 mg total) by mouth 4 (four) times daily. 40 tablet 1  . metoprolol succinate (TOPROL-XL) 25 MG 24 hr tablet Take 1 tablet (25 mg total) by mouth daily. 90 tablet 3  . Multiple Vitamin (MULTIVITAMIN) capsule Take 1 capsule by mouth 2 (two) times daily.      . NON FORMULARY (Suprema Dophilus) Take one capsule once a day    . omega-3 acid ethyl esters (LOVAZA) 1 G capsule Take 1 g by mouth  2 (two) times daily.     No current facility-administered medications on file prior to visit.    Review of Systems:  As per HPI- otherwise negative.   Physical Examination: Filed Vitals:   08/16/14 1255  BP: 144/90  Pulse: 79  Temp: 98.2 F (36.8 C)  Resp: 18   Filed Vitals:   08/16/14 1255  Height:  (1.651 m)  Weight: 198 lb (89.812 kg)   Body mass index is 32.95 kg/(m^2). Ideal Body Weight: Weight in (lb) to have BMI = 25: 149.9  GEN: WDWN, NAD, Non-toxic, A & O x 3, overweight, looks well HEENT: Atraumatic, Normocephalic. Neck supple. No masses, No LAD.  Bilateral TM wnl, oropharynx normal.  PEERL,EOMI.   Some mucus in nasal cavity Ears and Nose: No external deformity. CV: RRR, No M/G/R. No JVD. No thrill. No extra heart sounds. PULM: CTA B, no wheezes, crackles, rhonchi. No retractions. No resp. distress. No accessory muscle use. ABD: S, NT, ND, +BS. No rebound. No HSM. EXTR: No c/c/e NEURO Normal gait.  PSYCH: Normally interactive. Conversant. Not depressed or anxious appearing.  Calm demeanor.   BP Readings from Last 3 Encounters:  08/16/14 144/90  07/22/14 130/84  05/29/14 132/90    Results for orders placed or performed in visit on 08/16/14  POCT CBC  Result Value Ref Range   WBC 7.1 4.6 - 10.2 K/uL   Lymph, poc 2.2 0.6 - 3.4   POC LYMPH PERCENT 31.2 10 - 50 %L   MID (cbc) 0.7 0 - 0.9   POC MID % 9.4 0 - 12 %M   POC Granulocyte 4.2 2 - 6.9   Granulocyte percent 59.4 37 - 80 %G   RBC 4.23 4.04 - 5.48 M/uL   Hemoglobin 14.3 12.2 - 16.2 g/dL   HCT, POC 78.2 95.6 - 47.9 %   MCV 101.3 (A) 80 - 97 fL   MCH, POC 33.8 (A) 27 - 31.2 pg   MCHC 33.4 31.8 - 35.4 g/dL   RDW, POC 21.3 %   Platelet Count, POC 192 142 - 424 K/uL   MPV 8.4 0 - 99.8 fL   Assessment and Plan: Nasal congestion - Plan: POCT CBC  Macrocytosis - Plan: Vitamin B12, Folate  Here today with nasal congestion since this morning.  Educated that this is likely a viral infection and  that it is not appropriate to use abx for a viral URI.  She states understanding.  Also will check  B12 and folate for her macrocytosis and went over her medications with her.    She will follow-up if not feeling better in a few days -Sooner if worse.     Signed Abbe Amsterdam, MD

## 2014-08-16 NOTE — Patient Instructions (Addendum)
It appears that you have a viral infection.  I am sorry that you are not feeling well!  However antibiotics do NOT help with a vial infection; they are only useful for a bacterial linfection If you are not feeling better in the next few days or if you start getting worse or notice a fever please let us know  It does appear that you might be low on vitamin B12 or folate.  I will check these levels for you and will be in touch

## 2014-08-17 ENCOUNTER — Encounter: Payer: Self-pay | Admitting: Family Medicine

## 2014-10-06 ENCOUNTER — Encounter: Payer: Self-pay | Admitting: Physician Assistant

## 2014-10-06 ENCOUNTER — Ambulatory Visit (INDEPENDENT_AMBULATORY_CARE_PROVIDER_SITE_OTHER): Payer: BLUE CROSS/BLUE SHIELD | Admitting: Physician Assistant

## 2014-10-06 ENCOUNTER — Telehealth: Payer: Self-pay | Admitting: Physician Assistant

## 2014-10-06 VITALS — BP 166/96 | HR 83 | Temp 98.4°F | Resp 16 | Ht 64.5 in | Wt 188.0 lb

## 2014-10-06 DIAGNOSIS — H8110 Benign paroxysmal vertigo, unspecified ear: Secondary | ICD-10-CM

## 2014-10-06 DIAGNOSIS — R42 Dizziness and giddiness: Secondary | ICD-10-CM

## 2014-10-06 DIAGNOSIS — R110 Nausea: Secondary | ICD-10-CM

## 2014-10-06 LAB — POCT CBC
GRANULOCYTE PERCENT: 66.2 % (ref 37–80)
HCT, POC: 46.3 % (ref 37.7–47.9)
HEMOGLOBIN: 15 g/dL (ref 12.2–16.2)
LYMPH, POC: 2.1 (ref 0.6–3.4)
MCH, POC: 32.4 pg — AB (ref 27–31.2)
MCHC: 32.4 g/dL (ref 31.8–35.4)
MCV: 99.9 fL — AB (ref 80–97)
MID (cbc): 0.3 (ref 0–0.9)
MPV: 9.3 fL (ref 0–99.8)
PLATELET COUNT, POC: 186 10*3/uL (ref 142–424)
POC Granulocyte: 4.8 (ref 2–6.9)
POC LYMPH PERCENT: 29.1 %L (ref 10–50)
POC MID %: 4.7 %M (ref 0–12)
RBC: 4.63 M/uL (ref 4.04–5.48)
RDW, POC: 13.8 %
WBC: 7.3 10*3/uL (ref 4.6–10.2)

## 2014-10-06 LAB — COMPLETE METABOLIC PANEL WITH GFR
ALK PHOS: 54 U/L (ref 39–117)
ALT: 45 U/L — AB (ref 0–35)
AST: 31 U/L (ref 0–37)
Albumin: 4.2 g/dL (ref 3.5–5.2)
BUN: 17 mg/dL (ref 6–23)
CO2: 29 meq/L (ref 19–32)
Calcium: 9.6 mg/dL (ref 8.4–10.5)
Chloride: 101 mEq/L (ref 96–112)
Creat: 0.98 mg/dL (ref 0.50–1.10)
GFR, Est African American: 67 mL/min
GFR, Est Non African American: 58 mL/min — ABNORMAL LOW
GLUCOSE: 98 mg/dL (ref 70–99)
POTASSIUM: 4.7 meq/L (ref 3.5–5.3)
SODIUM: 139 meq/L (ref 135–145)
TOTAL PROTEIN: 7.3 g/dL (ref 6.0–8.3)
Total Bilirubin: 0.5 mg/dL (ref 0.2–1.2)

## 2014-10-06 LAB — POCT URINALYSIS DIPSTICK
Bilirubin, UA: NEGATIVE
Blood, UA: NEGATIVE
GLUCOSE UA: NEGATIVE
Ketones, UA: NEGATIVE
Leukocytes, UA: NEGATIVE
Nitrite, UA: NEGATIVE
PH UA: 7
PROTEIN UA: NEGATIVE
Urobilinogen, UA: 0.2

## 2014-10-06 LAB — POCT UA - MICROSCOPIC ONLY
CASTS, UR, LPF, POC: NEGATIVE
Crystals, Ur, HPF, POC: NEGATIVE
Epithelial cells, urine per micros: NEGATIVE
MUCUS UA: POSITIVE
Yeast, UA: NEGATIVE

## 2014-10-06 LAB — TSH: TSH: 2.317 u[IU]/mL (ref 0.350–4.500)

## 2014-10-06 MED ORDER — ONDANSETRON HCL 4 MG PO TABS: 4.0000 mg | ORAL_TABLET | Freq: Three times a day (TID) | ORAL | Status: DC | PRN

## 2014-10-06 MED ORDER — LORAZEPAM 0.5 MG PO TABS: 0.5000 mg | ORAL_TABLET | Freq: Two times a day (BID) | ORAL | Status: DC | PRN

## 2014-10-06 MED ORDER — MECLIZINE HCL 25 MG PO TABS: 25.0000 mg | ORAL_TABLET | Freq: Three times a day (TID) | ORAL | Status: DC | PRN

## 2014-10-06 MED ORDER — MECLIZINE HCL 32 MG PO TABS: 32.0000 mg | ORAL_TABLET | Freq: Three times a day (TID) | ORAL | Status: DC | PRN

## 2014-10-06 NOTE — Progress Notes (Signed)
10/06/2014 at 7:24 PM  Carmen RegalPatricia J Brown / DOB: 25-May-1941 / MRN: [id number]  The patient has OBESITY, NOS; HYPERTENSION, BENIGN SYSTEMIC; PAROXYSMAL VENTRICULAR TACHYCARDIA; CARDIOMEGALY; RHINITIS, ALLERGIC; MENOPAUSAL SYNDROME; OSTEOARTHRITIS, LOWER LEG; CERVICAL SPINE DISORDER, NOS; and Chest pressure on her problem list.  SUBJECTIVE  Chief compalaint: Dizziness and Emesis   History of present illness: Carmen Brown is 74 y.o. well appearing female presenting for stable moderate dizziness that started 3 days ago. Her dizziness, which she describes as a spinning sensation, is made worse by turning her head and with position changes. She Associated symptoms include nausea and she denies weakness, difficulty with speech, decreased strength, chest pain, palpitations, and SOB. She denies ringing in the ears and jaw pain. She denies a history of vertigo. She has tried meclizine with fair to good relief.   She  has a past medical history of Paroxysmal VT; Thyroid disease; Cardiomyopathy; Allergy; Arthritis; Heart murmur; Chest pain; Chest pressure; Obesity; Hypertension, benign; Cardiomegaly; Menopausal syndrome; and Osteoarthritis of lower leg, localized.    She  has a current medication list which includes the following prescription(s): aspirin, biotin, vitamin d3, coenzyme q10, fluticasone, glucosamine sulfate, lisinopril, metoprolol succinate, multivitamin, NON FORMULARY, omega-3 acid ethyl esters, benzonatate, hydrocodone-acetaminophen, and methocarbamol.  Carmen Brown is allergic to epinephrine and latex. She  reports that she has never smoked. She has never used smokeless tobacco. She reports that she drinks alcohol. She reports that she does not use illicit drugs. She  has no sexual activity history on file. The patient  has past surgical history that includes ventricular tacticartia and Mass excision (03/15/2012).  Her family history includes Diabetes in her maternal grandfather; Heart disease in her  father and mother; Stroke in her maternal grandmother.  ROS  Per HPI  OBJECTIVE  Her  height is 5' 4.5" (1.638 m) and weight is 188 lb (85.276 kg). Her oral temperature is 98.4 F (36.9 C). Her blood pressure is 166/96 and her pulse is 83. Her respiration is 16 and oxygen saturation is 96%.  The patient's body mass index is 31.78 kg/(m^2). BP rechecked by me at 145/85.  Physical Exam  Constitutional: She is oriented to person, place, and time. She appears well-developed and well-nourished.  Cardiovascular: Normal rate, regular rhythm and normal heart sounds.   Respiratory: Effort normal and breath sounds normal.  GI: Soft. Bowel sounds are normal.  Musculoskeletal: Normal range of motion.  Neurological: She is alert and oriented to person, place, and time. She has normal reflexes.  Skin: Skin is warm and dry.  Psychiatric: She has a normal mood and affect.    Results for orders placed or performed in visit on 10/06/14 (from the past 24 hour(s))  POCT CBC     Status: Abnormal   Collection Time: 10/06/14  3:15 PM  Result Value Ref Range   WBC 7.3 4.6 - 10.2 K/uL   Lymph, poc 2.1 0.6 - 3.4   POC LYMPH PERCENT 29.1 10 - 50 %L   MID (cbc) 0.3 0 - 0.9   POC MID % 4.7 0 - 12 %M   POC Granulocyte 4.8 2 - 6.9   Granulocyte percent 66.2 37 - 80 %G   RBC 4.63 4.04 - 5.48 M/uL   Hemoglobin 15.0 12.2 - 16.2 g/dL   HCT, POC 40.946.3 81.137.7 - 47.9 %   MCV 99.9 (A) 80 - 97 fL   MCH, POC 32.4 (A) 27 - 31.2 pg   MCHC 32.4 31.8 - 35.4 g/dL  RDW, POC 13.8 %   Platelet Count, POC 186 142 - 424 K/uL   MPV 9.3 0 - 99.8 fL  POCT UA - Microscopic Only     Status: None   Collection Time: 10/06/14  3:20 PM  Result Value Ref Range   WBC, Ur, HPF, POC 0-1    RBC, urine, microscopic 2-5    Bacteria, U Microscopic 1+    Mucus, UA pos    Epithelial cells, urine per micros neg    Crystals, Ur, HPF, POC neg    Casts, Ur, LPF, POC neg    Yeast, UA neg   POCT urinalysis dipstick     Status: None    Collection Time: 10/06/14  3:22 PM  Result Value Ref Range   Color, UA yellow    Clarity, UA clear    Glucose, UA neg    Bilirubin, UA neg    Ketones, UA neg    Spec Grav, UA <=1.005    Blood, UA neg    pH, UA 7.0    Protein, UA neg    Urobilinogen, UA 0.2    Nitrite, UA neg    Leukocytes, UA Negative    Dix Hallpike: Negative   EKG: WNL, reviewed by Dr. Clelia Croft.   ASSESSMENT & PLAN  Carmen Brown was seen today for dizziness and emesis.  Diagnoses and all orders for this visit:  Dizzy:  Neurological, cardiac, and EKG all reassuring.  This is most likely BPPV and will treat as such with problem 2.  Orders: -     POCT CBC -     POCT UA - Microscopic Only -     POCT urinalysis dipstick -     TSH -     COMPLETE METABOLIC PANEL WITH GFR -     EKG 12-Lead  BPPV (benign paroxysmal positional vertigo), unspecified laterality Orders: -     Meclizine (ANTIVERT) 25 MG tablet; Take 1 tablet (32 mg total) by mouth 3 (three) times daily as needed. -     LORazepam (ATIVAN) 0.5 MG tablet; Take 1 tablet (0.5 mg total) by mouth at night prn for dizziness.   Nausea Orders: -     ondansetron (ZOFRAN) 4 MG tablet; Take 1 tablet (4 mg total) by mouth every 8 (eight) hours as needed for nausea or vomiting. -     Advised that she drink copious fluids.      The patient was advised to call or come back to clinic if she does not see an improvement in symptoms, or worsens with the above plan.   [name], MHS, PA-C [facility] 10/06/2014 7:24 PM

## 2014-10-06 NOTE — Telephone Encounter (Signed)
32 mg Antivert not available. Authorized change to 25 mg.

## 2014-10-07 ENCOUNTER — Telehealth: Payer: Self-pay

## 2014-10-07 NOTE — Telephone Encounter (Signed)
Pharmacy sent over a fax stating they do not have Meclizine 32 mg tablets. [facility]

## 2014-11-17 ENCOUNTER — Ambulatory Visit (INDEPENDENT_AMBULATORY_CARE_PROVIDER_SITE_OTHER): Payer: BLUE CROSS/BLUE SHIELD | Admitting: Physician Assistant

## 2014-11-17 ENCOUNTER — Encounter: Payer: Self-pay | Admitting: Physician Assistant

## 2014-11-17 ENCOUNTER — Telehealth: Payer: Self-pay | Admitting: *Deleted

## 2014-11-17 VITALS — BP 156/100 | HR 73 | Temp 98.4°F | Resp 16 | Ht 64.5 in | Wt 190.0 lb

## 2014-11-17 DIAGNOSIS — M25562 Pain in left knee: Secondary | ICD-10-CM | POA: Diagnosis not present

## 2014-11-17 DIAGNOSIS — M25561 Pain in right knee: Secondary | ICD-10-CM

## 2014-11-17 NOTE — Patient Instructions (Signed)
Your appointment is at 2:45pm.

## 2014-11-17 NOTE — Telephone Encounter (Signed)
Faxed progress notes to Dr Thomasena Edisollins, patient has an appointment at 2:45 pm for both knees to be injected.

## 2014-11-17 NOTE — Progress Notes (Signed)
[facility] [address] [place] [address] [phone]  Date:  11/17/2014   Name:  Carmen Brown   DOB:  May 20, 1941   MRN:  [id number]  PCP:  Tally Due, MD    Chief Complaint: Knee Pain   History of Present Illness:  Carmen Brown is a 74 y.o. very pleasant female patient who presents with the following:  Patient reports weeks of bilateral knee pain that is very similar to the knee pain that she has had in the past, that has progressively worsened.  She reports pain with her both knees with initially standing and when she is standing for a long period.  The knee pain is throughout the knee of the right, and more so dominant in the posterior part of knee on the left.  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  Last long travel was 2 weeks ago.  She has no recent hx of cough, chest pains, or calf pain.  She has no hx of malignancy.  She denies redness or swelling to the knee.  She has a hx of gout but it has never been in her knee.  She generally is treated for her knee pain, arthritis of etiology, with cortisone injections at [facility] orthopa[facility].  She reports that this resolves the pain greatly.  She denies any recent trauma to the knee.  She has no numbness or tingling in the knee, no change in color to the lower extremity, and denies instability.  She has used ice for relief which helps to resolve the knee pain nightly and upon initial movement, but the pain returns after use.    Patient Active Problem List   Diagnosis Date Noted  . Chest pressure 11/05/2012  . PAROXYSMAL VENTRICULAR TACHYCARDIA 08/21/2009  . OBESITY, NOS 10/12/2006  . HYPERTENSION, BENIGN SYSTEMIC 10/12/2006  . CARDIOMEGALY 10/12/2006  . RHINITIS, ALLERGIC 10/12/2006  . MENOPAUSAL SYNDROME 10/12/2006  . OSTEOARTHRITIS, LOWER LEG 10/12/2006  . CERVICAL SPINE DISORDER, NOS 10/12/2006    Past Medical History   Diagnosis Date  . Paroxysmal VT   . Thyroid disease   . Cardiomyopathy   . Allergy   . Arthritis   . Heart murmur   . Chest pain   . Chest pressure   . Obesity   . Hypertension, benign     systemic  . Cardiomegaly   . Menopausal syndrome   . Osteoarthritis of lower leg, localized     Past Surgical History  Procedure Laterality Date  . Ventricular tacticartia    . Mass excision  03/15/2012    Procedure: MINOR EXCISION OF MASS;  Surgeon: Wyn Forster., MD;  Location: [facility];  Service: Orthopedics;  Laterality: Right;  debride IP right long finger, excision mucoid cyst    History  Substance Use Topics  . Smoking status: Never Smoker   . Smokeless tobacco: Never Used  . Alcohol Use: Yes     Comment: occasional    Family History  Problem Relation Age of Onset  . Heart disease Mother   . Heart disease Father   . Stroke Maternal Grandmother   . Diabetes Maternal Grandfather     Allergies  Allergen Reactions  . Epinephrine Other (See Comments)    unknown  . Latex     rash    Medication list has been reviewed and updated.  Current Outpatient Prescriptions on  File Prior to Visit  Medication Sig Dispense Refill  . aspirin 81 MG tablet Take 81 mg by mouth daily.      . Biotin 10 MG TABS Take by mouth.    . Cholecalciferol (VITAMIN D3) 2000 UNIT TABS Take 1 tablet by mouth daily.     . Coenzyme Q10 (CO Q 10 PO) Take 50 mg by mouth daily.     . fluticasone (FLONASE) 50 MCG/ACT nasal spray PLACE 2 SPRAYS INTO THE NOSE DAILY. 16 g 7  . lisinopril (PRINIVIL,ZESTRIL) 20 MG tablet Take 1 tablet (20 mg total) by mouth daily. 90 tablet 3  . metoprolol succinate (TOPROL-XL) 25 MG 24 hr tablet Take 1 tablet (25 mg total) by mouth daily. 90 tablet 3  . Multiple Vitamin (MULTIVITAMIN) capsule Take 1 capsule by mouth 2 (two) times daily.      . NON FORMULARY (Suprema Dophilus) Take one capsule once a day    . omega-3 acid ethyl esters (LOVAZA) 1 G capsule  Take 1 g by mouth 2 (two) times daily.    . Glucosamine Sulfate 750 MG CAPS Take 2 capsules by mouth daily.    Marland Kitchen. HYDROcodone-acetaminophen (NORCO/VICODIN) 5-325 MG per tablet Take 1 tablet by mouth every 6 (six) hours as needed. (Patient not taking: Reported on 10/06/2014) 30 tablet 0  . LORazepam (ATIVAN) 0.5 MG tablet Take 1 tablet (0.5 mg total) by mouth 2 (two) times daily as needed for anxiety. (Patient not taking: Reported on 11/17/2014) 30 tablet 1  . meclizine (ANTIVERT) 25 MG tablet Take 1 tablet (25 mg total) by mouth 3 (three) times daily as needed for dizziness. (Patient not taking: Reported on 11/17/2014) 30 tablet 0  . methocarbamol (ROBAXIN-750) 750 MG tablet Take 1 tablet (750 mg total) by mouth 4 (four) times daily. (Patient not taking: Reported on 10/06/2014) 40 tablet 1  . ondansetron (ZOFRAN) 4 MG tablet Take 1 tablet (4 mg total) by mouth every 8 (eight) hours as needed for nausea or vomiting. (Patient not taking: Reported on 11/17/2014) 20 tablet 0   No current facility-administered medications on file prior to visit.    Review of Systems: ROS otherwise unremarkable unless listed above.   Physical Examination: Filed Vitals:   11/17/14 1332  BP: 156/100  Pulse: 73  Temp: 98.4 F (36.9 C)  Resp: 16   Filed Vitals:   11/17/14 1332  Height: 5' 4.5" (1.638 m)  Weight: 190 lb (86.183 kg)   Body mass index is 32.12 kg/(m^2). Ideal Body Weight: Weight in (lb) to have BMI = 25: 147.6  Physical Exam  Constitutional: She is oriented to person, place, and time. She appears well-developed and well-nourished. No distress.  HENT:  Head: Normocephalic and atraumatic.  Eyes: EOM are normal. Pupils are equal, round, and reactive to light.  Cardiovascular: Normal rate, regular rhythm and normal heart sounds.  Exam reveals no gallop.   No murmur heard. Pulses:      Carotid pulses are 2+ on the right side.      Dorsalis pedis pulses are 2+ on the right side.  Pulmonary/Chest: No  apnea. No respiratory distress. She has no decreased breath sounds. She has no wheezes.  Musculoskeletal:  No erythema or warmth to both knees.  Normal   No tenderness to palpation along condyles, patella, or patella tendon.  No pain to posterior knees bilaterally as well.  ROM without crepitus, or cogging.  Negative anterior drawer test.  Negative McMurray.  Normal distal DP pulses.  Neurological: She is alert and oriented to person, place, and time.  Skin: Skin is warm and dry.  Psychiatric: She has a normal mood and affect. Her behavior is normal.     Assessment and Plan: 74 year old female is here today for bilateral knee pain.  This appears to be the arthritis, with pain returning gradually.  I am recommending an injection bilaterally at this time, to help diminish symptoms.  At this time orthopedic injection is appreciated by an ortho specialist.  [facility] have graciously accepted to see her today.   Rechecked blood pressure.  This appears likely due to the knee pain.  Arthralgia of both knees  Trena Platt, PA-C [facility] 4/4/20162:27 PM

## 2014-12-20 ENCOUNTER — Ambulatory Visit (INDEPENDENT_AMBULATORY_CARE_PROVIDER_SITE_OTHER): Payer: BLUE CROSS/BLUE SHIELD | Admitting: Physician Assistant

## 2014-12-20 VITALS — BP 156/92 | HR 77 | Temp 97.9°F | Resp 19 | Ht 64.0 in | Wt 188.0 lb

## 2014-12-20 DIAGNOSIS — R300 Dysuria: Secondary | ICD-10-CM

## 2014-12-20 LAB — POCT URINALYSIS DIPSTICK
BILIRUBIN UA: NEGATIVE
Blood, UA: NEGATIVE
GLUCOSE UA: NEGATIVE
KETONES UA: NEGATIVE
NITRITE UA: POSITIVE
PH UA: 5.5
Protein, UA: NEGATIVE
Spec Grav, UA: <=1.005
Urobilinogen, UA: 0.2

## 2014-12-20 LAB — POCT UA - MICROSCOPIC ONLY
Casts, Ur, LPF, POC: NEGATIVE
Crystals, Ur, HPF, POC: NEGATIVE
Mucus, UA: NEGATIVE
Yeast, UA: NEGATIVE

## 2014-12-20 MED ORDER — SULFAMETHOXAZOLE-TRIMETHOPRIM 800-160 MG PO TABS: 1.0000 | ORAL_TABLET | Freq: Two times a day (BID) | ORAL | Status: DC

## 2014-12-20 NOTE — Patient Instructions (Signed)
You have a UTI.  Please take the antibiotic twice daily for 5 days.  We sent a culture to ensure we've placed you on the correct antibiotic. I'll contact you if we need to change.

## 2014-12-20 NOTE — Progress Notes (Signed)
   Subjective:    Patient ID: Carmen [facility], female    DOB: [birth year], 74 y.o.   MRN: [id number]  Chief Complaint  Patient presents with  . Urinary Frequency    painful urination, burning, since yesterday, no discharge   Medications, allergies, past medical history, surgical history, family history, social history and problem list reviewed and updated.  HPI  [age] yof presents with dysuria, increased freq since yest.   Initially burning with urination yest. Continued today. Increased freq this am. Azo with no relief. Denies abd pain, hematuria, n/v, fevers, chills, vaginal dc, itchiness, odor. Denies polydipsia. Does not drink much water.   BP mildly elevated today. Pt takes bp meds as prescribed.   Review of Systems See HPI.     Objective:   Physical Exam  Constitutional: She is oriented to person, place, and time. She appears well-developed and well-nourished.  Non-toxic appearance. She does not have a sickly appearance. She does not appear ill. No distress.  BP 156/92 mmHg  Pulse 77  Temp(Src) 97.9 F (36.6 C) (Oral)  Resp 19  Ht 5\' 4"  (1.626 m)  Wt 188 lb (85.276 kg)  BMI 32.25 kg/m2  SpO2 98%   Abdominal: There is no CVA tenderness.  Neurological: She is alert and oriented to person, place, and time.   Results for orders placed or performed in visit on 12/20/14  POCT urinalysis dipstick  Result Value Ref Range   Color, UA orange    Clarity, UA clear    Glucose, UA neg    Bilirubin, UA neg    Ketones, UA neg    Spec Grav, UA <=1.005    Blood, UA neg    pH, UA 5.5    Protein, UA neg    Urobilinogen, UA 0.2    Nitrite, UA pos    Leukocytes, UA Trace   POCT UA - Microscopic Only  Result Value Ref Range   WBC, Ur, HPF, POC 1-3    RBC, urine, microscopic 0-1    Bacteria, U Microscopic trace    Mucus, UA neg    Epithelial cells, urine per micros 1-2    Crystals, Ur, HPF, POC neg    Casts, Ur, LPF, POC neg    Yeast, UA neg       Assessment & Plan:   [age]  yof presents with dysuria, increased freq since yest.   Dysuria - Plan: POCT urinalysis dipstick, POCT UA - Microscopic Only --ua with trace leuks, positive nitrites --urine cx sent --bactrim ds bid 5 days --no signs pyelo  Donnajean Lopesodd M. Carmellia Kreisler, PA-C Physician Assistant-Certified [facility]  12/20/2014 12:04 PM

## 2014-12-21 LAB — URINE CULTURE
Colony Count: NO GROWTH
ORGANISM ID, BACTERIA: NO GROWTH

## 2015-01-26 ENCOUNTER — Encounter: Payer: Self-pay | Admitting: *Deleted

## 2015-02-04 ENCOUNTER — Telehealth: Payer: Self-pay

## 2015-02-04 NOTE — Telephone Encounter (Signed)
PA needed for celecoxib for arthralgia of knees bilaterally. Started on covermymeds but waiting on them to load clinical questions.

## 2015-02-05 ENCOUNTER — Telehealth: Payer: Self-pay

## 2015-02-05 NOTE — Telephone Encounter (Signed)
PA approved for celecoxib through 02/05/16, case # [id number]. Notified pharm.

## 2015-03-06 ENCOUNTER — Ambulatory Visit (INDEPENDENT_AMBULATORY_CARE_PROVIDER_SITE_OTHER): Payer: BLUE CROSS/BLUE SHIELD | Admitting: Family Medicine

## 2015-03-06 VITALS — BP 138/85 | HR 80 | Temp 97.9°F | Resp 16 | Ht 65.0 in | Wt 189.0 lb

## 2015-03-06 DIAGNOSIS — R300 Dysuria: Secondary | ICD-10-CM

## 2015-03-06 LAB — POCT UA - MICROSCOPIC ONLY
CASTS, UR, LPF, POC: NEGATIVE
Crystals, Ur, HPF, POC: NEGATIVE
MUCUS UA: NEGATIVE
RBC, urine, microscopic: NEGATIVE
Yeast, UA: NEGATIVE

## 2015-03-06 LAB — POCT URINALYSIS DIPSTICK
BILIRUBIN UA: NEGATIVE
Blood, UA: NEGATIVE
GLUCOSE UA: NEGATIVE
KETONES UA: NEGATIVE
Nitrite, UA: NEGATIVE
Protein, UA: NEGATIVE
Spec Grav, UA: 1.015
Urobilinogen, UA: 0.2
pH, UA: 5

## 2015-03-06 MED ORDER — ESTRADIOL 0.1 MG/GM VA CREA: TOPICAL_CREAM | VAGINAL | Status: DC

## 2015-03-06 MED ORDER — CIPROFLOXACIN HCL 250 MG PO TABS: 250.0000 mg | ORAL_TABLET | Freq: Two times a day (BID) | ORAL | Status: DC

## 2015-03-06 NOTE — Progress Notes (Signed)
Subjective:  This chart was scribed for Carmen Sorenson, MD by [name], ED Scribe. This patient was seen in room 25 and the patient's care was started at 3:12 PM.  Patient ID: Carmen Brown, female    DOB: Dec 27, 1940, 74 y.o.   MRN: [id number]  HPI  Chief Complaint  Patient presents with  . Dysuria    x 3 days   HPI Comments:  Carmen Brown is a 74 y.o. female who presents to the [facility] complaining of dysuria. Pt was seen 5/7 for UTI symptoms including severe dysuria x 1 day; was not relieved by AZO. She was treated with bactrim x 5 days, but urine culture show she did not have an infection. Prior to that she was treated UTI in December that again, did not grow bacteria, but was treated with cipro x 5 days. Prior to that September  Urine culture negative. Last positive UTI was 18 months ago with a very small amount of klebsiella but not enough to qualify as a true infection.   Pt reports symptoms beginning with dysuria and feeling on edge and anxious. She has tolerated past abx for UTI symptoms with relief to symptoms. She experiences dysuria about every couple months and takes cranberry supplement twice a day as a preventative measure. She has not seen urologist in the past. States her last pelvic exam was by Dr. [name] many years ago.  She drinks a lot of unsweetened tea and water. She denies incontinence including enuresis, frequency, urgency, odor,  Vaginal discharge, and discoloration. She fenies fever, chills and kidney pain.   Pt has been a flight attendant for 30 years and is flying to [place] twice during the summer and will be leaving in 2 days.    She denies abx allergies.   Past Medical History  Diagnosis Date  . Paroxysmal VT   . Thyroid disease   . Cardiomyopathy   . Allergy   . Arthritis   . Heart murmur   . Chest pain   . Chest pressure   . Obesity   . Hypertension, benign     systemic  . Cardiomegaly   . Menopausal syndrome   .  Osteoarthritis of lower leg, localized    Past Surgical History  Procedure Laterality Date  . Ventricular tacticartia    . Mass excision  03/15/2012    Procedure: MINOR EXCISION OF MASS;  Surgeon: Wyn Forster., MD;  Location: [facility];  Service: Orthopedics;  Laterality: Right;  debride IP right long finger, excision mucoid cyst   Prior to Admission medications   Medication Sig Start Date End Date Taking? Authorizing Provider  aspirin 81 MG tablet Take 81 mg by mouth daily.     Yes Historical Provider, MD  Biotin 10 MG TABS Take by mouth.   Yes Historical Provider, MD  celecoxib (CELEBREX) 200 MG capsule Take 200 mg by mouth 2 (two) times daily.   Yes Historical Provider, MD  Cholecalciferol (VITAMIN D3) 2000 UNIT TABS Take 1 tablet by mouth daily.    Yes Historical Provider, MD  Coenzyme Q10 (CO Q 10 PO) Take 50 mg by mouth daily.    Yes Historical Provider, MD  fluticasone (FLONASE) 50 MCG/ACT nasal spray PLACE 2 SPRAYS INTO THE NOSE DAILY.   Yes Nelva Nay, PA-C  Glucosamine Sulfate 750 MG CAPS Take 2 capsules by mouth daily.   Yes Historical Provider, MD  lisinopril (PRINIVIL,ZESTRIL) 20 MG tablet  Take 1 tablet (20 mg total) by mouth daily. 03/13/14  Yes Sherren Mocha, MD  metoprolol succinate (TOPROL-XL) 25 MG 24 hr tablet Take 1 tablet (25 mg total) by mouth daily. 03/13/14  Yes Sherren Mocha, MD  Multiple Vitamin (MULTIVITAMIN) capsule Take 1 capsule by mouth 2 (two) times daily.     Yes Historical Provider, MD  NON FORMULARY (Suprema Dophilus) Take one capsule once a day   Yes Historical Provider, MD  omega-3 acid ethyl esters (LOVAZA) 1 G capsule Take 1 g by mouth 2 (two) times daily.   Yes Historical Provider, MD   Review of Systems  Constitutional: Negative for fever, chills, diaphoresis, activity change, appetite change, fatigue and unexpected weight change.  Gastrointestinal: Negative for nausea, vomiting, abdominal pain, diarrhea, constipation, blood in  stool, anal bleeding and rectal pain.  Genitourinary: Positive for dysuria. Negative for urgency, frequency, hematuria, decreased urine volume, vaginal bleeding, vaginal discharge, enuresis, difficulty urinating, genital sores, vaginal pain, menstrual problem, pelvic pain and dyspareunia.  Musculoskeletal: Negative for myalgias, back pain and gait problem.  Skin: Negative for rash.  Hematological: Negative for adenopathy.  Psychiatric/Behavioral: Positive for sleep disturbance. The patient is nervous/anxious.    Objective:   Physical Exam  Constitutional: She is oriented to person, place, and time. She appears well-developed and well-nourished. No distress.  HENT:  Head: Normocephalic and atraumatic.  Eyes: Conjunctivae and EOM are normal.  Neck: Neck supple.  Cardiovascular: Normal rate, regular rhythm, S1 normal, S2 normal and normal heart sounds.  Exam reveals no gallop and no friction rub.   No murmur heard. Pulmonary/Chest: Effort normal and breath sounds normal. No respiratory distress. She has no wheezes. She has no rales. She exhibits no tenderness.  Abdominal: Soft. Bowel sounds are normal. She exhibits no distension and no mass. There is no tenderness. There is no rebound and no guarding.  Musculoskeletal: Normal range of motion.  Neurological: She is alert and oriented to person, place, and time.  Skin: Skin is warm and dry.  Psychiatric: She has a normal mood and affect. Her behavior is normal.  Nursing note and vitals reviewed.  Filed Vitals:   03/06/15 1459  BP: 138/85  Pulse: 80  Temp: 97.9 F (36.6 C)  Resp: 16  Height: 5\' 5"  (1.651 m)  Weight: 189 lb (85.73 kg)   Assessment & Plan:   1. Dysuria   Reviewed w/ pt that she has been treated for 3 UTIs over the past year but all have been clx neg - last clx + was 18 mos prior and not even adequate CFUs in that clx.  UA today overall fairly benign and so pt was given rx for cipro for her to fill and take with her on  her trip to [place] next week in cases sxs persist/recur but encouraged to consider holding off on starting the cipro until clx results return.  Rec that she see urology to r/o other bladder irritation/inflammation such as IC as cause of freq sxs.  ADDENDUM: clx + but resistent to cipro so start keflex.  Orders Placed This Encounter  Procedures  . Urine culture  . POCT urinalysis dipstick  . POCT UA - Microscopic Only    Meds ordered this encounter  Medications  . celecoxib (CELEBREX) 200 MG capsule    Sig: Take 200 mg by mouth 2 (two) times daily.  . ciprofloxacin (CIPRO) 250 MG tablet    Sig: Take 1 tablet (250 mg total) by mouth 2 (two) times  daily. X3d, repeat if sxs recur    Dispense:  12 tablet    Refill:  0  . estradiol (ESTRACE VAGINAL) 0.1 MG/GM vaginal cream    Sig: Apply pea-sized amount around urethra every night x 2 wks, then twice a week    Dispense:  42.5 g    Refill:  1                   I personally performed the services described in this documentation, which was scribed in my presence. The recorded information has been reviewed and considered, and addended by me as needed.  Carmen Sorenson, MD MPH  Results for orders placed or performed in visit on 03/06/15  Urine culture  Result Value Ref Range   Culture ESCHERICHIA COLI    Colony Count 70,000 COLONIES/ML    Organism ID, Bacteria ESCHERICHIA COLI       Susceptibility   Escherichia coli -  (no method available)    AMPICILLIN 8 Sensitive     AMOX/CLAVULANIC 4 Sensitive     AMPICILLIN/SULBACTAM 4 Sensitive     PIP/TAZO <=4 Sensitive     IMIPENEM <=0.25 Sensitive     CEFAZOLIN <=4 Sensitive     CEFTRIAXONE <=1 Sensitive     CEFTAZIDIME <=1 Sensitive     CEFEPIME <=1 Sensitive     GENTAMICIN <=1 Sensitive     TOBRAMYCIN <=1 Sensitive     CIPROFLOXACIN >=4 Resistant     LEVOFLOXACIN >=8 Resistant     NITROFURANTOIN <=16 Sensitive     TRIMETH/SULFA >=320 Resistant   POCT urinalysis dipstick  Result Value  Ref Range   Color, UA yellow    Clarity, UA cloudy    Glucose, UA neg    Bilirubin, UA neg    Ketones, UA neg    Spec Grav, UA 1.015    Blood, UA neg    pH, UA 5.0    Protein, UA neg    Urobilinogen, UA 0.2    Nitrite, UA neg    Leukocytes, UA moderate (2+) (A) Negative  POCT UA - Microscopic Only  Result Value Ref Range   WBC, Ur, HPF, POC 6-12    RBC, urine, microscopic neg    Bacteria, U Microscopic trace    Mucus, UA neg    Epithelial cells, urine per micros 0-2    Crystals, Ur, HPF, POC neg    Casts, Ur, LPF, POC neg    Yeast, UA neg

## 2015-03-06 NOTE — Patient Instructions (Signed)
Please come back in for a pelvic exam within the next 2-3 months and before any refill on the estrogen cream - if you had time for a full physical that would be great. It could take up to 3 months for the estrogen cream to plump up the tissues surrounding your urethra and cause less urinary symptoms.  I would recommend seeing a urology to check and make sure you do not have any underlying bladder problems such as below since you have not had any infection as causes of your symptoms for years.  A referral will usually take 1-2 months at minimum so please call whenever you are willing to let us get started on this.  Interstitial Cystitis Interstitial cystitis (IC) is a condition that results in discomfort or pain in the bladder and the surrounding pelvic region. The symptoms can be different from case to case and even in the same individual. People may experience:  Mild discomfort.  Pressure.  Tenderness.  Intense pain in the bladder and pelvic area. CAUSES  Because IC varies so much in symptoms and severity, people studying this disease believe it is not one but several diseases. Some caregivers use the term painful bladder syndrome (PBS) to describe cases with painful urinary symptoms. This may not meet the strictest definition of IC. The term IC / PBS includes all cases of urinary pain that cannot be connected to other causes, such as infection or urinary stones.  SYMPTOMS  Symptoms may include:  An urgent need to urinate.  A frequent need to urinate.  A combination of these symptoms. Pain may change in intensity as the bladder fills with urine or as it empties. Women's symptoms often get worse during menstruation. They may sometimes experience pain with vaginal intercourse. Some of the symptoms of IC / PBS seem like those of bacterial infection. Tests do not show infection. IC / PBS is far more common in women than in men.  DIAGNOSIS  The diagnosis of IC / PBS is based on:  Presence of  pain related to the bladder, usually along with problems of frequency and urgency.  Not finding other diseases that could cause the symptoms.  Diagnostic tests that help rule out other diseases include:  Urinalysis.  Urine culture.  Cystoscopy.  Biopsy of the bladder wall.  Distension of the bladder under anesthesia.  Urine cytology.  Laboratory examination of prostate secretions. A biopsy is a tissue sample that can be looked at under a microscope. Samples of the bladder and urethra may be removed during a cystoscopy. A biopsy helps rule out bladder cancer. TREATMENT  Scientists have not yet found a cure for IC / PBS. Patients with IC / PBS do not get better with antibiotic therapy. Caregivers cannot predict who will respond best to which treatment. Symptoms may disappear without explanation. Disappearing symptoms may coincide with an event such as a change in diet or treatment. Even when symptoms disappear, they may return after days, weeks, months, or years.  Because the causes of IC / PBS are unknown, current treatments are aimed at relieving symptoms. Many people are helped by one or a combination of the treatments. As researchers learn more about IC / PBS, the list of potential treatments will change. Patients should discuss their options with a caregiver. SURGERY  Surgery should be considered only if all available treatments have failed and the pain is disabling. Many approaches and techniques are used. Each approach has its own advantages and complications. Advantages and complications should  be discussed with a urologist. Your caregiver may recommend consulting another urologist for a second opinion. Most caregivers are reluctant to operate because the outcome is unpredictable. Some people still have symptoms after surgery.  People considering surgery should discuss the potential risks and benefits, side effects, and long- and short-term complications with their family, as well as  with people who have already had the procedure. Surgery requires anesthesia, hospitalization, and in some cases weeks or months of recovery. As the complexity of the procedure increases, so do the chances for complications and for failure. HOME CARE INSTRUCTIONS   All drugs, even those sold over the counter, have side effects. Patients should always consult a caregiver before using any drug for an extended amount of time. Only take over-the-counter or prescription medicines for pain, discomfort, or fever as directed by your caregiver.  Many patients feel that smoking makes their symptoms worse. How the by-products of tobacco that are excreted in the urine affect IC / PBS is unknown. Smoking is the major known cause of bladder cancer. One of the best things smokers can do for their bladder and their overall health is to quit.  Many patients feel that gentle stretching exercises help relieve IC / PBS symptoms.  Methods vary, but basically patients decide to empty their bladder at designated times and use relaxation techniques and distractions to keep to the schedule. Gradually, patients try to lengthen the time between scheduled voids. A diary in which to record voiding times is usually helpful in keeping track of progress. MAKE SURE YOU:   Understand these instructions.  Will watch your condition.  Will get help right away if you are not doing well or get worse. Document Released: 04/01/2004 Document Revised: 10/24/2011 Document Reviewed: 06/16/2008 [facility] Patient Information 2015 [place], [place]. This information is not intended to replace advice given to you by your health care provider. Make sure you discuss any questions you have with your health care provider.

## 2015-03-09 LAB — URINE CULTURE

## 2015-03-10 MED ORDER — CEPHALEXIN 500 MG PO CAPS: 500.0000 mg | ORAL_CAPSULE | Freq: Three times a day (TID) | ORAL | Status: DC

## 2015-03-22 ENCOUNTER — Other Ambulatory Visit: Payer: Self-pay | Admitting: Family Medicine

## 2015-04-06 ENCOUNTER — Ambulatory Visit (INDEPENDENT_AMBULATORY_CARE_PROVIDER_SITE_OTHER): Payer: BLUE CROSS/BLUE SHIELD | Admitting: Physician Assistant

## 2015-04-06 ENCOUNTER — Ambulatory Visit (INDEPENDENT_AMBULATORY_CARE_PROVIDER_SITE_OTHER): Payer: BLUE CROSS/BLUE SHIELD

## 2015-04-06 VITALS — BP 116/78 | HR 83 | Temp 97.5°F | Resp 16 | Ht 64.0 in | Wt 185.6 lb

## 2015-04-06 DIAGNOSIS — M545 Low back pain, unspecified: Secondary | ICD-10-CM

## 2015-04-06 DIAGNOSIS — M5136 Other intervertebral disc degeneration, lumbar region: Secondary | ICD-10-CM

## 2015-04-06 DIAGNOSIS — I10 Essential (primary) hypertension: Secondary | ICD-10-CM

## 2015-04-06 MED ORDER — CELECOXIB 200 MG PO CAPS: 200.0000 mg | ORAL_CAPSULE | Freq: Every day | ORAL | Status: DC | PRN

## 2015-04-06 MED ORDER — LISINOPRIL 20 MG PO TABS: 20.0000 mg | ORAL_TABLET | Freq: Every day | ORAL | Status: DC

## 2015-04-06 MED ORDER — METOPROLOL SUCCINATE ER 25 MG PO TB24: 25.0000 mg | ORAL_TABLET | Freq: Every day | ORAL | Status: DC

## 2015-04-06 MED ORDER — TRAMADOL HCL 50 MG PO TABS: 50.0000 mg | ORAL_TABLET | Freq: Three times a day (TID) | ORAL | Status: DC | PRN

## 2015-04-06 NOTE — Progress Notes (Signed)
Patient ID: Carmen [facility], female   DOB: 1940/11/11, 74 y.o.   MRN: [id number]   04/06/2015 at 1:15 PM  Carmen Brown / DOB: 1941-05-02 / MRN: [id number]  Problem list reviewed and updated by me where necessary.   SUBJECTIVE  Carmen Brown is a 74 y.o. well appearing female presenting for the chief complaint of back pain.     She  has a past medical history of Paroxysmal VT; Thyroid disease; Cardiomyopathy; Allergy; Arthritis; Heart murmur; Chest pain; Chest pressure; Obesity; Hypertension, benign; Cardiomegaly; Menopausal syndrome; and Osteoarthritis of lower leg, localized.    Medications reviewed and updated by myself where necessary, and exist elsewhere in the encounter.   Carmen Brown is allergic to epinephrine and latex. She  reports that she has never smoked. She has never used smokeless tobacco. She reports that she drinks alcohol. She reports that she does not use illicit drugs. She  has no sexual activity history on file. The patient  has past surgical history that includes ventricular tacticartia and Mass excision (03/15/2012).  Her family history includes Diabetes in her maternal grandfather; Heart disease in her father and mother; Stroke in her maternal grandmother.  ROS  OBJECTIVE  Her  vitals were not taken for this visit. The patient's body mass index is unknown because there is no weight on file.  Physical Exam  No results found for this or any previous visit (from the past 24 hour(s)).  ASSESSMENT & PLAN  There are no diagnoses linked to this encounter.

## 2015-04-06 NOTE — Progress Notes (Signed)
   Subjective:    Patient ID: Carmen [facility], female    DOB: 11/09/1940, 74 y.o.   MRN: [id number]  Chief Complaint  Patient presents with  . Back Pain   Medications, allergies, past medical history, surgical history, family history, social history and problem list reviewed and updated.  HPI  27 yof presents with low back pain and needing refills.   She has chronic mild low back pain. Had lumbar xr may 2015 - advanced ddd at L5-S1. Takes celebrex prn for knees and tylenol prn for low back. For past few wks when she stands long periods while working her bilateral glutes start to ache. Resolves with rest. Walking does not worsen the pain. For past week has also had pain midline low back which is new for her. Mild but constant.   Has been applying heat which helps. She has muscle relaxants at home which she has taken and help. Denies fevers, chills, bowel/bladder dysfx, perianal los, uti sx, weakness/numbness/tingling down either LE.   Also mentions she needs refills on metoprolol, lisinopril, flonase. BP well controlled today. CMP normal 6 months ago.   Review of Systems See HPI     Objective:   Physical Exam  Constitutional: She appears well-developed and well-nourished.  Non-toxic appearance. She does not have a sickly appearance. She does not appear ill. No distress.  BP 116/78 mmHg  Pulse 83  Temp(Src) 97.5 F (36.4 C) (Oral)  Resp 16  Ht  (1.626 m)  Wt 185 lb 9.6 oz (84.188 kg)  BMI 31.84 kg/m2   Cardiovascular:  Pulses:      Dorsalis pedis pulses are 2+ on the right side, and 2+ on the left side.       Posterior tibial pulses are 2+ on the right side, and 2+ on the left side.  Abdominal: There is no tenderness.  Musculoskeletal:       Lumbar back: She exhibits tenderness. She exhibits no bony tenderness.  Bilateral lumbar paraspinal ttp. Negative SLR bilaterally.   Neurological: She has normal strength. No sensory deficit.  Normal strength, sensation bilateral  LEs.    [facility] reading (PRIMARY) by  Dr. Patsy Lager. Findings: Severe degenerative change at L5/S1.      Assessment & Plan:   Bilateral low back pain without sciatica - Plan: DG Lumbar Spine Complete, celecoxib (CELEBREX) 200 MG capsule  Degenerative disc disease, lumbar - Plan: DG Lumbar Spine Complete, celecoxib (CELEBREX) 200 MG capsule, traMADol (ULTRAM) 50 MG tablet  Essential hypertension - Plan: metoprolol succinate (TOPROL-XL) 25 MG 24 hr tablet, lisinopril (PRINIVIL,ZESTRIL) 20 MG tablet --xr with no acute process, ongoing severe ddd --doubt cauda equina/abscess/arterial insuff with normal pulses, no radicular sx, no fevers/chills, no signs compression, no pathologic fx on xr --continue Celebrex but no more than 200 mg qd, schedule tylenol, tramadol prn --discussed option of seeing dr Ethelene Hal with [facility] for further pain management/possible epidural, she is agreeable to this, is already seeing [facility] for her knees, and plans to schedule herself to see dr Ethelene Hal.  --refilled meds as requested, lisinopril/metoprolol - cmp normal 6 months ago, bp well controlled today  Donnajean Lopes, PA-C Physician Assistant-Certified [facility]  04/06/2015 5:17 PM

## 2015-04-06 NOTE — Patient Instructions (Addendum)
I've refilled your blood pressure medications for one year.  I've refilled the Celebrex as well. Please only take this at max once daily, just as needed. Do not take more than one per day.  You can take tylenol 500 mg every 6-8 hours to help with the pain.  Your xray showed continued severe disc disease in your low back.  Please take the tramadol as needed if the pain gets severe. Keep in mind this can make you sleepy so only take it when you're home and not working or driving.  Please contact [facility] and try to see Dr Ethelene Hal for your low back pain.

## 2015-06-04 ENCOUNTER — Telehealth: Payer: Self-pay

## 2015-06-04 NOTE — Telephone Encounter (Signed)
Patient brought in FMLA forms to be completed by Dr. Perrin MalteseGuest, I have filled out what I could and highlighted the areas that need to be completed, please return to the FMLA/Disability box at the [address] Check out desk within the next 5-7 business days. Placing in your box on 06/04/15.

## 2015-06-08 NOTE — Telephone Encounter (Signed)
Completed forms received back from Dr. Perrin MalteseGuest. Patient has been notified via VM that they are available for pick up at our walk in center.

## 2015-06-11 DIAGNOSIS — Z0271 Encounter for disability determination: Secondary | ICD-10-CM

## 2015-08-10 ENCOUNTER — Other Ambulatory Visit: Payer: Self-pay

## 2015-08-10 MED ORDER — FLUTICASONE PROPIONATE 50 MCG/ACT NA SUSP: NASAL | Status: DC

## 2015-11-15 IMAGING — CR DG LUMBAR SPINE COMPLETE 4+V
5 series · 5 of 5 positions shown · non-contrast
Comparison: December 20, 2013.

CLINICAL DATA: Bilateral low back pain without sciatica.

EXAM:
LUMBAR SPINE - COMPLETE 4+ VIEW

[AP]
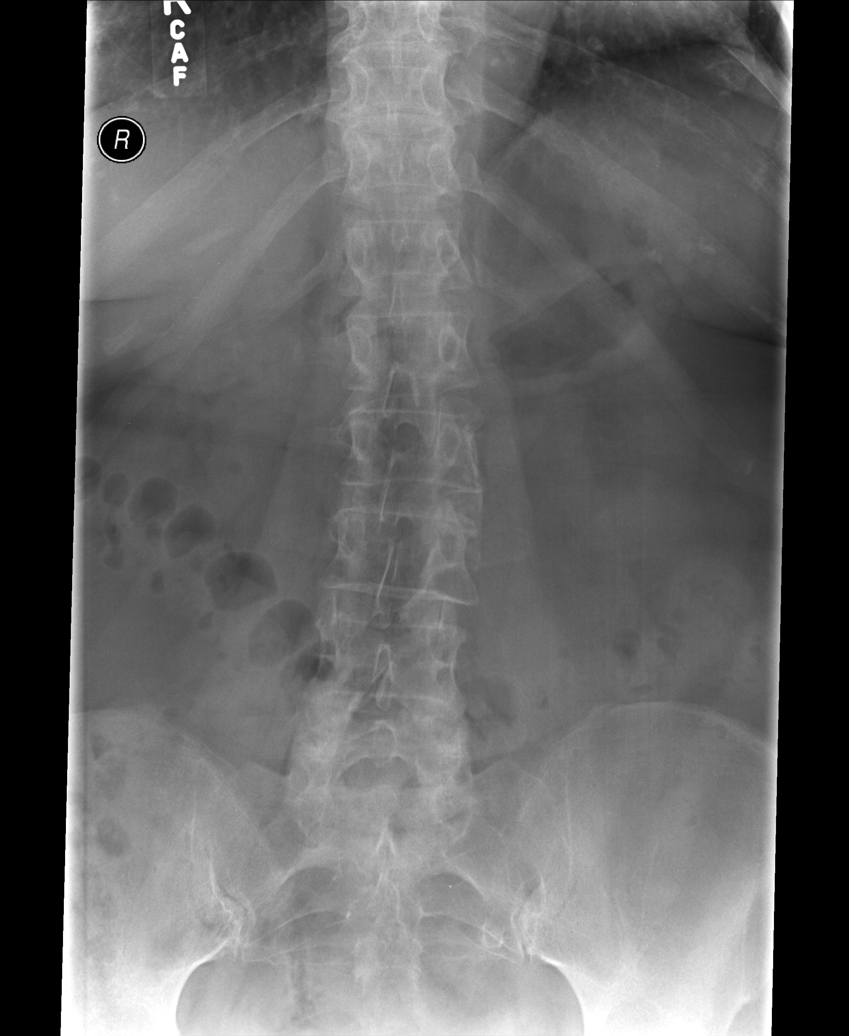

[rpo]
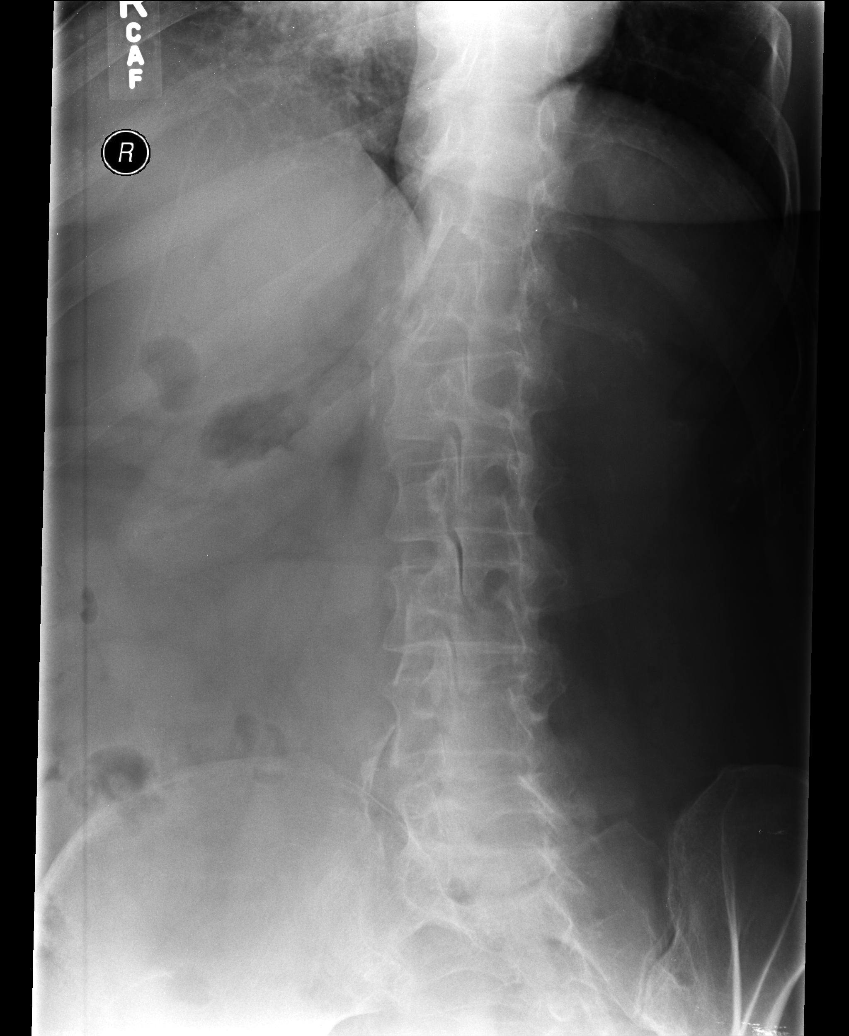

[lpo]
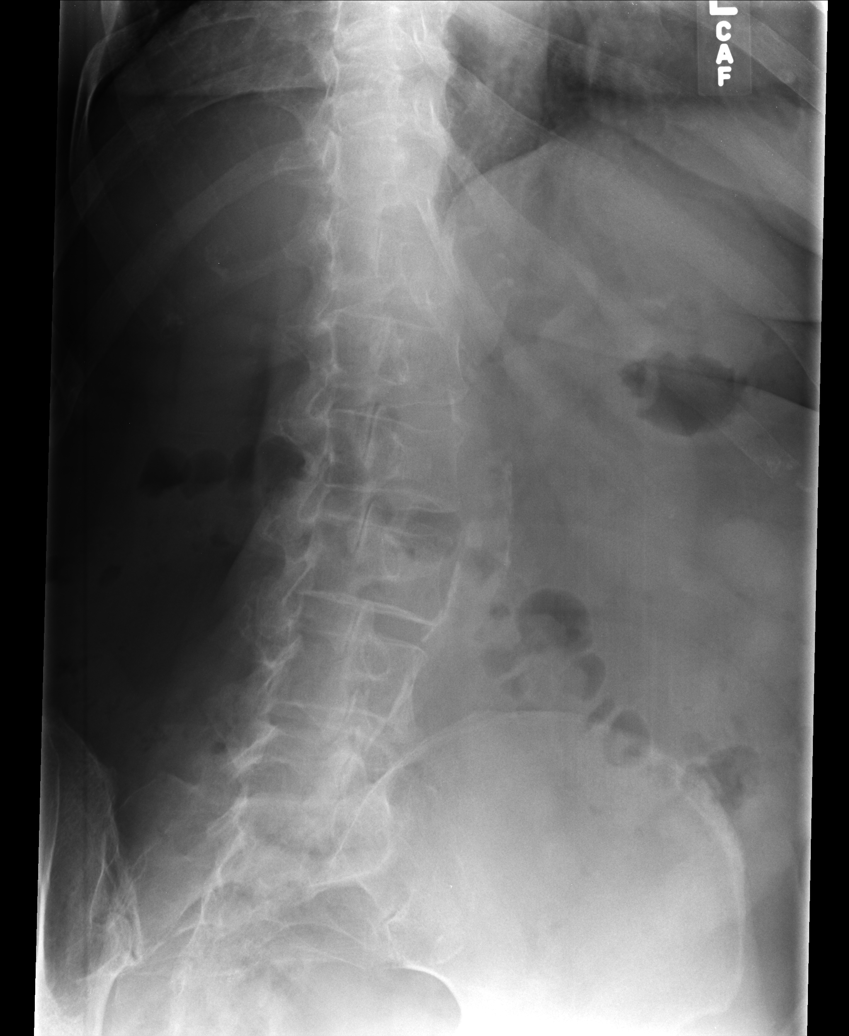

[lateral]
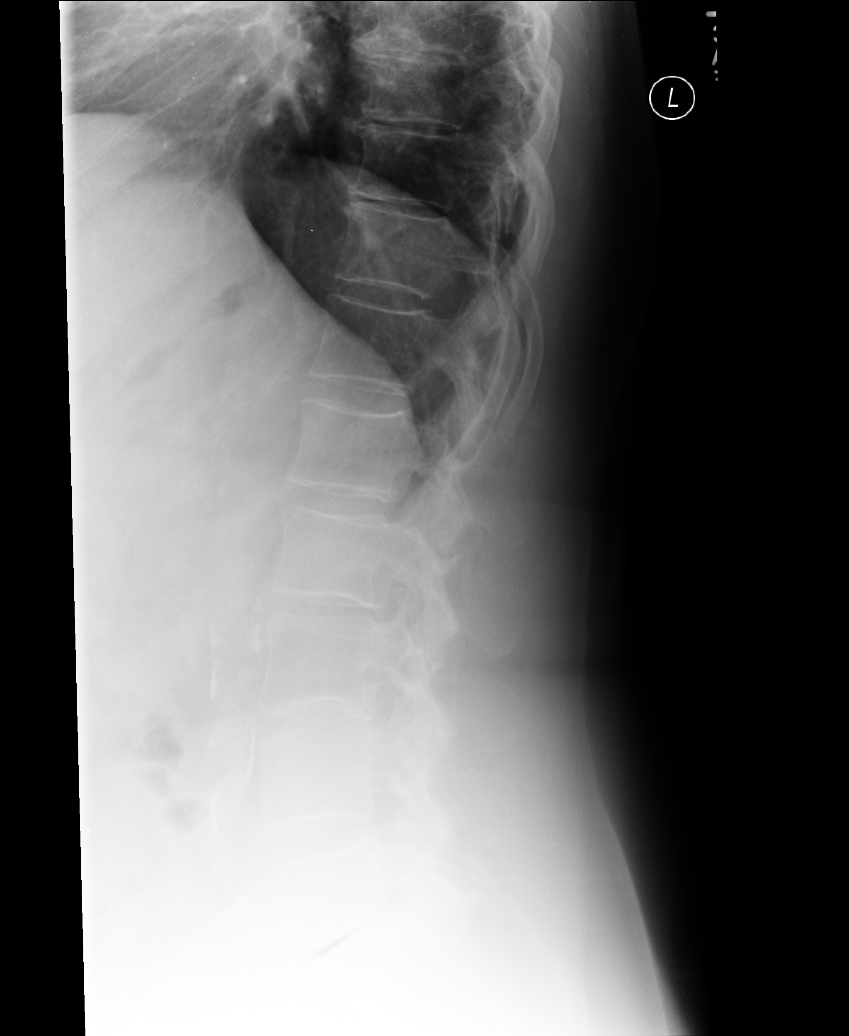

[l5 s1]
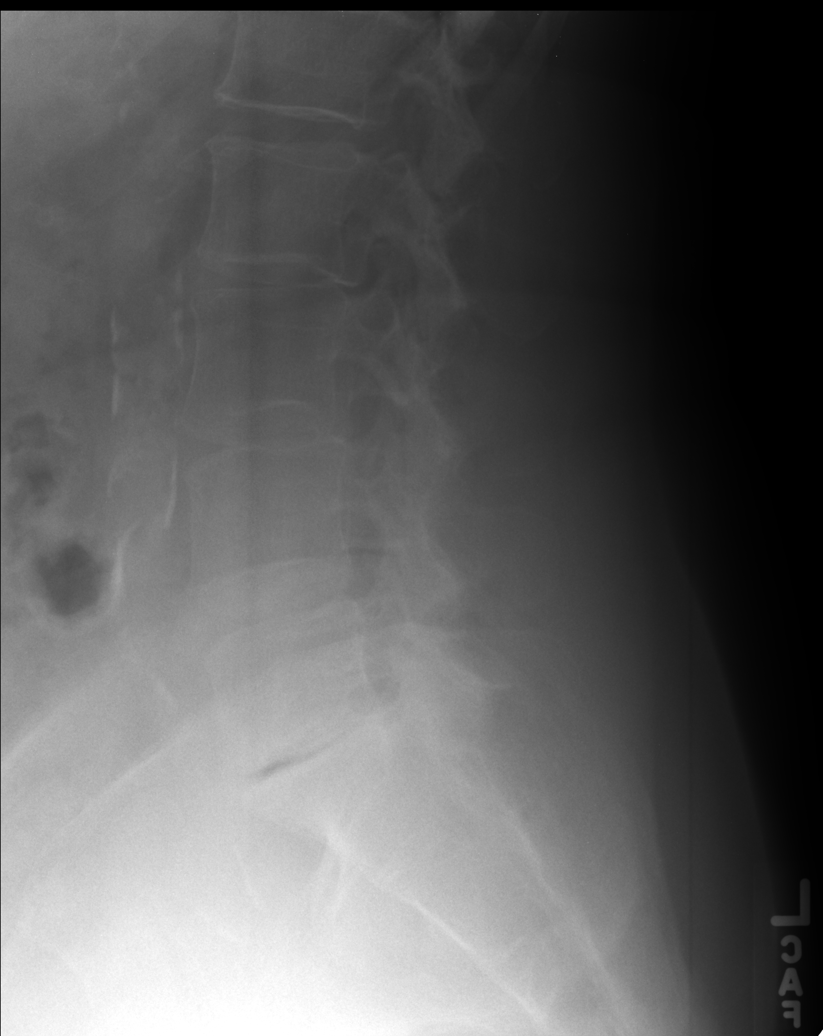

[5 of 5 positions shown; findings below may reference images not displayed]

FINDINGS: No fracture or spondylolisthesis is noted. Severe degenerative disc
disease is noted at L5-S1. Atherosclerosis of abdominal aorta is
noted. Degenerative changes seen involving the posterior facet
joints of L5-S1. Minimal levoscoliosis of lumbar spine is noted.
IMPRESSION: Severe degenerative disc disease is noted at L5-S1. No acute
abnormality seen in the lumbar spine.

## 2016-04-25 ENCOUNTER — Ambulatory Visit (INDEPENDENT_AMBULATORY_CARE_PROVIDER_SITE_OTHER): Payer: BLUE CROSS/BLUE SHIELD | Admitting: Family Medicine

## 2016-04-25 VITALS — BP 144/86 | HR 68 | Temp 98.5°F | Resp 16 | Ht 64.0 in | Wt 178.0 lb

## 2016-04-25 DIAGNOSIS — H8110 Benign paroxysmal vertigo, unspecified ear: Secondary | ICD-10-CM | POA: Diagnosis not present

## 2016-04-25 DIAGNOSIS — J309 Allergic rhinitis, unspecified: Secondary | ICD-10-CM

## 2016-04-25 DIAGNOSIS — M179 Osteoarthritis of knee, unspecified: Secondary | ICD-10-CM

## 2016-04-25 DIAGNOSIS — I10 Essential (primary) hypertension: Secondary | ICD-10-CM

## 2016-04-25 DIAGNOSIS — M545 Low back pain, unspecified: Secondary | ICD-10-CM

## 2016-04-25 DIAGNOSIS — M5136 Other intervertebral disc degeneration, lumbar region: Secondary | ICD-10-CM | POA: Diagnosis not present

## 2016-04-25 DIAGNOSIS — M1712 Unilateral primary osteoarthritis, left knee: Secondary | ICD-10-CM

## 2016-04-25 DIAGNOSIS — M1711 Unilateral primary osteoarthritis, right knee: Secondary | ICD-10-CM

## 2016-04-25 DIAGNOSIS — Z13220 Encounter for screening for lipoid disorders: Secondary | ICD-10-CM | POA: Diagnosis not present

## 2016-04-25 LAB — LIPID PANEL
CHOL/HDL RATIO: 3.8 ratio (ref <=5.0)
Cholesterol: 194 mg/dL (ref 125–200)
HDL: 51 mg/dL (ref >=46)
LDL CALC: 90 mg/dL (ref <130)
TRIGLYCERIDES: 265 mg/dL — AB (ref <150)
VLDL: 53 mg/dL — AB (ref <30)

## 2016-04-25 LAB — COMPLETE METABOLIC PANEL WITH GFR
ALT: 40 U/L — ABNORMAL HIGH (ref 6–29)
AST: 32 U/L (ref 10–35)
Albumin: 4.4 g/dL (ref 3.6–5.1)
Alkaline Phosphatase: 55 U/L (ref 33–130)
BILIRUBIN TOTAL: 0.4 mg/dL (ref 0.2–1.2)
BUN: 18 mg/dL (ref 7–25)
CHLORIDE: 103 mmol/L (ref 98–110)
CO2: 30 mmol/L (ref 20–31)
Calcium: 10.1 mg/dL (ref 8.6–10.4)
Creat: 0.85 mg/dL (ref 0.60–0.93)
GFR, EST AFRICAN AMERICAN: 79 mL/min (ref >=60)
GFR, EST NON AFRICAN AMERICAN: 68 mL/min (ref >=60)
Glucose, Bld: 84 mg/dL (ref 65–99)
POTASSIUM: 5 mmol/L (ref 3.5–5.3)
Sodium: 141 mmol/L (ref 135–146)
Total Protein: 7.2 g/dL (ref 6.1–8.1)

## 2016-04-25 MED ORDER — CELECOXIB 200 MG PO CAPS: 200.0000 mg | ORAL_CAPSULE | Freq: Every day | ORAL | 0 refills | Status: DC | PRN

## 2016-04-25 MED ORDER — LISINOPRIL 20 MG PO TABS: 20.0000 mg | ORAL_TABLET | Freq: Every day | ORAL | 1 refills | Status: DC

## 2016-04-25 MED ORDER — ONDANSETRON 4 MG PO TBDP: 4.0000 mg | ORAL_TABLET | Freq: Three times a day (TID) | ORAL | 0 refills | Status: DC | PRN

## 2016-04-25 MED ORDER — FLUTICASONE PROPIONATE 50 MCG/ACT NA SUSP: NASAL | 6 refills | Status: DC

## 2016-04-25 MED ORDER — MECLIZINE HCL 25 MG PO TABS: 25.0000 mg | ORAL_TABLET | Freq: Three times a day (TID) | ORAL | 0 refills | Status: DC | PRN

## 2016-04-25 MED ORDER — METOPROLOL SUCCINATE ER 25 MG PO TB24: 25.0000 mg | ORAL_TABLET | Freq: Every day | ORAL | 1 refills | Status: DC

## 2016-04-25 MED ORDER — DICLOFENAC SODIUM 1 % TD GEL: 4.0000 g | Freq: Four times a day (QID) | TRANSDERMAL | 3 refills | Status: DC

## 2016-04-25 NOTE — Patient Instructions (Addendum)
  We recommend that you schedule a mammogram for breast cancer screening. Typically, you do not need a referral to do this. Please contact a local imaging center to schedule your mammogram.  [facility] - [phone]  *ask for the Radiology Department [facility]) - [phone]) [phone] or [phone]) [phone]  [facility] - [phone] [facility] - [phone] [facility] - [phone]  *ask for the Radiology Department [facility] - [phone]  *ask for the Radiology Department [facility] - [phone]  *ask for the Mammography Department [facility] - [phone]    IF you received an x-ray today, you will receive an invoice from [facility]. Please contact [facility] at [phone] with questions or concerns regarding your invoice.   IF you received labwork today, you will receive an invoice from [facility]. Please contact [facility] at [phone] with questions or concerns regarding your invoice.   Our billing staff will not be able to assist you with questions regarding bills from these companies.  You will be contacted with the lab results as soon as they are available. The fastest way to get your results is to activate your My Chart account. Instructions are located on the last page of this paperwork. If you have not heard from us regarding the results in 2 weeks, please contact this office.     Meclizine or Zofran if needed for vertigo symptoms. If it does recur, return to discuss other treatments or evaluation.  Continue Flonase for allergy symptoms as needed.  Monitor your blood pressures outside of the office, and if remaining over 140/90, return to discuss medication change.  I would prefer you not use Celebrex due to heart disease risks as we discussed. Try the diclofenac cream 4 times per day as needed to the affected  areas, OR Celebrex if that does not help. (Do not use both medications at the same time).  Tylenol is safer if needed for breakthrough pain, and can be combined with either one of those medications. Continue follow-up with orthopedist if worsening of knee or back pain.  I will check cholesterol tests, but if those are elevated, would need to return for 8 hours fasting lab visit. Please schedule physical with primary care provider within the next 6 months..Marland Kitchen

## 2016-04-25 NOTE — Progress Notes (Signed)
By signing my name below, I, Mesha Guinyard, attest that this documentation has been prepared under the direction and in the presence of Meredith Staggers, MD.  Electronically Signed: Arvilla Market, Medical Scribe. 04/25/16. 4:05 PM.  Subjective:    Patient ID: Carmen [facility], female    DOB: [birth year], 75 y.o.   MRN: [id number]  HPI Chief Complaint  Patient presents with  . Medication Refill    lisinopril, metoprolol, celebrex  . Other    discuss about refill on meclizine, ondansetron    HPI Comments: Carmen Brown is a 75 y.o. female with a PMHx of HTN, osteoarthritis, and allergic arthritis who presents to the [facility] for multiple medication refills. Pt was last seen over a year ago. She is a new pt to me. Pt last ate breakfast at 10:30 am (about 6 hours ago).  HTN: Takes Lisinopril and Metoprolol. Pt has been compliant with her medication and she just ran out of her bp medications today. Lab Results  Component Value Date   CREATININE 0.98 10/06/2014   She had borderline elevated ALT on her CMP Feb 2016.   Osteoarthritis of spine and leg: She has been prescribed Celebrex in the past, number #30 with 5 refills in Aug 2016. Lumbar spine x-ray Aug 2016 indicated severe DDD L5-S1 and bilateral knee May 2016, chronic medial compartment degeneration in both knees progressed since 2012, moderate femoral disease on the right.   Pt took Celebrex 4 times in the last month. Pt lost 16 lbs, and states it gave her relief to her knees. Pt reports having injections in her wrist, and 5 visco supplementation injections in her knees at [facility] with her last knee injection Dec 31st. Pt hardly takes Tylenol.  Vertigo: She was also requesting Meclizine and Zofran. She was seen Feb 2016 with suspected BPPV/vertigo; treated with Ativan and Meclizine, Zofran if needed.  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 Pt  hasn't had any vertigo since her last office visit.  Allergic Rhinitis: Pt would like to get a prescription for Flonase and uses it PRN.  Immunizations: Pt would like to get tested for Hep C. Pt drinks occasionally, and reports her last drink was about a week ago. Pt hasn't got her flu shot this year, and would ike to get her flu shot later in Oct. Immunization History  Administered Date(s) Administered  . Influenza Split 08/28/2011  . Influenza,inj,Quad PF,36+ Mos 07/22/2014  . Pneumococcal Polysaccharide-23 07/28/2009  . Td 07/15/2000, 02/21/2012  . Tdap 02/21/2012  . Zoster 05/16/2011   Patient Active Problem List   Diagnosis Date Noted  . Degenerative disc disease, lumbar 04/06/2015  . Chest pressure 11/05/2012  . PAROXYSMAL VENTRICULAR TACHYCARDIA 08/21/2009  . OBESITY, NOS 10/12/2006  . HYPERTENSION, BENIGN SYSTEMIC 10/12/2006  . CARDIOMEGALY 10/12/2006  . RHINITIS, ALLERGIC 10/12/2006  . MENOPAUSAL SYNDROME 10/12/2006  . OSTEOARTHRITIS, LOWER LEG 10/12/2006  . CERVICAL SPINE DISORDER, NOS 10/12/2006   Past Medical History:  Diagnosis Date  . Allergy   . Arthritis   . Cardiomegaly   . Cardiomyopathy (HCC)   . Chest pain   . Chest pressure   . Heart murmur   . Hypertension, benign    systemic  . Menopausal syndrome   . Obesity   . Osteoarthritis of lower leg, localized   . Paroxysmal VT (HCC)   . Thyroid disease    Past Surgical History:  Procedure Laterality  Date  . MASS EXCISION  03/15/2012   Procedure: MINOR EXCISION OF MASS;  Surgeon: Wyn Forster., MD;  Location: [facility];  Service: Orthopedics;  Laterality: Right;  debride IP right long finger, excision mucoid cyst  . ventricular tacticartia     Allergies  Allergen Reactions  . Epinephrine Other (See Comments)    unknown  . Latex     rash   Prior to Admission medications   Medication Sig Start Date End Date Taking? Authorizing Provider  aspirin 81 MG tablet Take 81 mg by mouth  daily.     Yes Historical Provider, MD  Biotin 10 MG TABS Take by mouth.   Yes Historical Provider, MD  celecoxib (CELEBREX) 200 MG capsule Take 1 capsule (200 mg total) by mouth daily as needed. 04/06/15  Yes Todd McVeigh, PA  Cholecalciferol (VITAMIN D3) 2000 UNIT TABS Take 1 tablet by mouth daily.    Yes Historical Provider, MD  Coenzyme Q10 (CO Q 10 PO) Take 50 mg by mouth daily.    Yes Historical Provider, MD  fluticasone (FLONASE) 50 MCG/ACT nasal spray PLACE 2 SPRAYS INTO THE NOSE DAILY. 08/10/15  Yes Dorna Leitz, PA-C  Glucosamine Sulfate 750 MG CAPS Take 2 capsules by mouth daily.   Yes Historical Provider, MD  lisinopril (PRINIVIL,ZESTRIL) 20 MG tablet Take 1 tablet (20 mg total) by mouth daily. 04/06/15  Yes Todd McVeigh, PA  metoprolol succinate (TOPROL-XL) 25 MG 24 hr tablet Take 1 tablet (25 mg total) by mouth daily. 04/06/15  Yes Raelyn Ensign, PA  Multiple Vitamin (MULTIVITAMIN) capsule Take 2 capsules by mouth 2 (two) times daily.    Yes Historical Provider, MD  NON FORMULARY (Suprema Dophilus) Take one capsule once a day   Yes Historical Provider, MD  omega-3 acid ethyl esters (LOVAZA) 1 G capsule Take 1 g by mouth 2 (two) times daily.   Yes Historical Provider, MD  traMADol (ULTRAM) 50 MG tablet Take 1 tablet (50 mg total) by mouth every 8 (eight) hours as needed. 04/06/15  Yes Raelyn Ensign, PA   Social History   Social History  . Marital status: Single    Spouse name: N/A  . Number of children: N/A  . Years of education: N/A   Occupational History  . Not on file.   Social History Main Topics  . Smoking status: Never Smoker  . Smokeless tobacco: Never Used  . Alcohol use Yes     Comment: occasional  . Drug use: No  . Sexual activity: Not on file   Other Topics Concern  . Not on file   Social History Narrative  . No narrative on file   Depression screen [facility] 2/9 04/25/2016 03/06/2015 12/20/2014 11/17/2014 10/06/2014  Decreased Interest 0 0 0 0 0  Down, Depressed,  Hopeless 0 0 0 0 0  PHQ - 2 Score 0 0 0 0 0   Review of Systems  Musculoskeletal: Positive for arthralgias.  Allergic/Immunologic: Positive for environmental allergies.  Neurological: Negative for dizziness.   Objective:  Physical Exam  Constitutional: She appears well-developed and well-nourished. No distress.  HENT:  Head: Normocephalic and atraumatic.  Mouth/Throat: Oropharynx is clear and moist.  Eyes: Conjunctivae are normal. Pupils are equal, round, and reactive to light.  Neck: Neck supple.  Cardiovascular: Normal rate, regular rhythm and normal heart sounds.  Exam reveals no friction rub.   No murmur heard. Pulmonary/Chest: Effort normal and breath sounds normal. No respiratory distress. She has no  wheezes. She has no rales.  Abdominal: She exhibits no mass. There is no tenderness. There is no guarding.  Musculoskeletal: She exhibits no edema.  Neurological: She is alert.  Skin: Skin is warm and dry.  Psychiatric: She has a normal mood and affect. Her behavior is normal.  Nursing note and vitals reviewed.  BP (!) 144/86 (BP Location: Right Arm, Patient Position: Sitting, Cuff Size: Large)   Pulse 68   Temp 98.5 F (36.9 C) (Oral)   Resp 16   Ht 5\' 4"  (1.626 m)   Wt 178 lb (80.7 kg)   SpO2 98%   BMI 30.55 kg/m  Assessment & Plan:   Carmen Brown is a 75 y.o. female Screening for hyperlipidemia - Plan: COMPLETE METABOLIC PANEL WITH GFR, Lipid panel  Essential hypertension - Plan: COMPLETE METABOLIC PANEL WITH GFR, lisinopril (PRINIVIL,ZESTRIL) 20 MG tablet, metoprolol succinate (TOPROL-XL) 25 MG 24 hr tablet  - labs pending, borderline in office, continue same doses for now, monitor blood pressure outside of office and if remaining elevated, return to discuss change in medication.  Allergic rhinitis, unspecified allergic rhinitis type - Plan: fluticasone (FLONASE) 50 MCG/ACT nasal spray  -Overall stable. Continue Flonase  BPPV (benign paroxysmal positional  vertigo), unspecified laterality - Plan: ondansetron (ZOFRAN ODT) 4 MG disintegrating tablet, meclizine (ANTIVERT) 25 MG tablet  -Prior episode, no recent symptoms. Previous medication was expired. New prescription for Zofran and meclizine if needed for flare of symptoms, but RTC if the symptoms recur and do not quickly improve  Bilateral low back pain without sciatica - Plan: celecoxib (CELEBREX) 200 MG capsule Degenerative disc disease, lumbar - Plan: celecoxib (CELEBREX) 200 MG capsule Osteoarthritis of left knee, unspecified osteoarthritis type - Plan: celecoxib (CELEBREX) 200 MG capsule, diclofenac sodium (VOLTAREN) 1 % GEL Osteoarthritis of right knee, unspecified osteoarthritis type - Plan: celecoxib (CELEBREX) 200 MG capsule, diclofenac sodium (VOLTAREN) 1 % GEL  -History of low back pain and DDD, osteoarthritis of bilateral knees. Intermittent dosing of Celebrex, but discussed cardiac risks with long-term use can try topical Voltaren as may be less risky, or refer back to intermittent Celebrex dosing only. RTC precautions.  Meds ordered this encounter  Medications  . lisinopril (PRINIVIL,ZESTRIL) 20 MG tablet    Sig: Take 1 tablet (20 mg total) by mouth daily.    Dispense:  90 tablet    Refill:  1  . fluticasone (FLONASE) 50 MCG/ACT nasal spray    Sig: PLACE 2 SPRAYS INTO THE NOSE DAILY.    Dispense:  16 g    Refill:  6  . metoprolol succinate (TOPROL-XL) 25 MG 24 hr tablet    Sig: Take 1 tablet (25 mg total) by mouth daily.    Dispense:  90 tablet    Refill:  1  . celecoxib (CELEBREX) 200 MG capsule    Sig: Take 1 capsule (200 mg total) by mouth daily as needed.    Dispense:  30 capsule    Refill:  0  . diclofenac sodium (VOLTAREN) 1 % GEL    Sig: Apply 4 g topically 4 (four) times daily.    Dispense:  100 g    Refill:  3  . ondansetron (ZOFRAN ODT) 4 MG disintegrating tablet    Sig: Take 1 tablet (4 mg total) by mouth every 8 (eight) hours as needed for nausea or vomiting.      Dispense:  10 tablet    Refill:  0  . meclizine (ANTIVERT) 25 MG tablet  Sig: Take 1 tablet (25 mg total) by mouth 3 (three) times daily as needed for dizziness.    Dispense:  30 tablet    Refill:  0   Patient Instructions    We recommend that you schedule a mammogram for breast cancer screening. Typically, you do not need a referral to do this. Please contact a local imaging [facility] to schedule your mammogram.  [facility] - [phone]  *ask for the Radiology Department [facility]) - [phone] or [phone]  [facility] - [phone] [facility] - [phone] [facility] [place] - [phone]  *ask for the Radiology Department [facility] - [phone]  *ask for the Radiology Department [facility] - [phone]  *ask for the Mammography Department [facility] - [phone]    IF you received an x-ray today, you will receive an invoice from [facility]. Please contact [facility] at [phone] with questions or concerns regarding your invoice.   IF you received labwork today, you will receive an invoice from [facility]. Please contact [facility] at [phone] with questions or concerns regarding your invoice.   Our billing staff will not be able to assist you with questions regarding bills from these companies.  You will be contacted with the lab results as soon as they are available. The fastest way to get your results is to activate your My Chart account. Instructions are located on the last page of this paperwork. If you have not heard from [place] regarding the results in 2 weeks, please contact this office.     Meclizine or Zofran if needed for vertigo symptoms. If it does recur, return to discuss other treatments or evaluation.  Continue Flonase for allergy symptoms as needed.  Monitor your blood pressures  outside of the office, and if remaining over 140/90, return to discuss medication change.  I would prefer you not use Celebrex due to heart disease risks as we discussed. Try the diclofenac cream 4 times per day as needed to the affected areas, OR Celebrex if that does not help. (Do not use both medications at the same time).  Tylenol is safer if needed for breakthrough pain, and can be combined with either one of those medications. Continue follow-up with orthopedist if worsening of knee or back pain.  I will check cholesterol tests, but if those are elevated, would need to return for 8 hours fasting lab visit. Please schedule physical with primary care provider within the next 6 months..       I personally performed the services described in this documentation, which was scribed in my presence. The recorded information has been reviewed and considered, and addended by me as needed.   Signed,   Meredith Staggers, MD [facility].  04/27/16 10:09 PM

## 2016-04-27 ENCOUNTER — Telehealth: Payer: Self-pay

## 2016-04-27 NOTE — Telephone Encounter (Signed)
Spoke with patient concerning celebrex refill. She stated that she did not need it and would take advil if needed.

## 2016-05-06 ENCOUNTER — Encounter: Payer: Self-pay | Admitting: *Deleted

## 2016-05-09 ENCOUNTER — Telehealth: Payer: Self-pay

## 2016-05-09 NOTE — Telephone Encounter (Signed)
PA for Diclofenac cream pending

## 2016-05-17 NOTE — Telephone Encounter (Signed)
PA denied. Pt needs to have failed at least 2 oral NSAIDS OR they have to have failed generic diclofenac sodium 1.5% topical solution AND have high risk for taking oral NSAIDS. Do you want to Rx the generic solution?

## 2016-05-22 NOTE — Telephone Encounter (Signed)
I'm not familiar with the dosing of the 1.5% solution. I can check into usual prescription as with the gel it should be 4 g 4 times a day.  When I am prescribing the Voltaren gel it is coming up as a generic. Is that different for her insurance?

## 2016-05-23 NOTE — Telephone Encounter (Signed)
The PA was done for the generic gel, but evidently plan prefers the solution.

## 2016-05-24 MED ORDER — DICLOFENAC SODIUM 1.5 % TD SOLN: TRANSDERMAL | 1 refills | Status: DC

## 2016-05-24 NOTE — Telephone Encounter (Signed)
Contacted pt to notify her of the change. LMOM

## 2016-05-24 NOTE — Telephone Encounter (Signed)
Dosing clarified, should be 10 drops to affected knee up to 4 times per day. Prescription was sent. Let me know if there are other questions.

## 2016-06-08 ENCOUNTER — Ambulatory Visit (INDEPENDENT_AMBULATORY_CARE_PROVIDER_SITE_OTHER): Payer: BLUE CROSS/BLUE SHIELD | Admitting: Family Medicine

## 2016-06-08 VITALS — BP 138/92 | HR 87 | Temp 98.1°F | Resp 17 | Ht 64.0 in | Wt 180.0 lb

## 2016-06-08 DIAGNOSIS — M1712 Unilateral primary osteoarthritis, left knee: Secondary | ICD-10-CM

## 2016-06-08 DIAGNOSIS — M1711 Unilateral primary osteoarthritis, right knee: Secondary | ICD-10-CM

## 2016-06-08 DIAGNOSIS — G5603 Carpal tunnel syndrome, bilateral upper limbs: Secondary | ICD-10-CM | POA: Diagnosis not present

## 2016-06-08 NOTE — Progress Notes (Signed)
   HPI  Patient presents today to discuss knee arthritis and carpal tunnel syndrome.  Patient has intermittent pain with walking due to bilateral knee arthritis. She has been seen by orthopedic surgery for several years for this. She's been receiving injections routinely. She has now finished a full course of discussed supplementation injections and has had improvement in symptoms.  She also has carpal tunnel syndrome bilaterally and receives routine injections from Dr. Amanda PeaGramig  She denies any increased pain currently and she feels that her symptoms are well managed in general.  She needs FMLA paperwork filled out for intermittent since from work due to pain. She works as a Financial controllerflight attendant and Mrs. on average 2-3 days per month due to knee pain or wrist pain.  She has a flare of pain she has very much difficulty completing the task at work.  PMH: Smoking status noted ROS: Per HPI  Objective: BP (!) 138/92 (BP Location: Left Arm, Patient Position: Sitting, Cuff Size: Normal)   Pulse 87   Temp 98.1 F (36.7 C) (Oral)   Resp 17   Ht 5\' 4"  (1.626 m)   Wt 180 lb (81.6 kg)   SpO2 94%   BMI 30.90 kg/m  Gen: NAD, alert, cooperative with exam HEENT: NCAT, EOMI, PERRL CV: RRR, good S1/S2, no murmur Resp: CTABL, no wheezes, non-labored Abd: SNTND, BS present, no guarding or organomegaly Ext: No edema, warm Neuro: Alert and oriented, No gross deficits  MSK: BL knee without gross deformity No joint line tenderness.  ligamentously intact to Lachman's and with varus and valgus stress.  Negative McMurray's test  Wrists appear WNL, Negative tinnels sign   Assessment and plan:  # BL knee OA Stable Exam reassuring FMLA paperwork filled out to have 2-3 days out per month as needed for knee or wrist pain, scanned.   # Carpal tunnel Stable Happy with treatment NO changes, RTC as needed   Kevin FentonSamuel Edge Mauger, MD 2:25 PM

## 2016-06-08 NOTE — Patient Instructions (Addendum)
  Great to meet you!     IF you received an x-ray today, you will receive an invoice from [facility]. Please contact [facility] at [phone] with questions or concerns regarding your invoice.   IF you received labwork today, you will receive an invoice from [facility]. Please contact [facility] at [phone] with questions or concerns regarding your invoice.   Our billing staff will not be able to assist you with questions regarding bills from these companies.  You will be contacted with the lab results as soon as they are available. The fastest way to get your results is to activate your My Chart account. Instructions are located on the last page of this paperwork. If you have not heard from us regarding the results in 2 weeks, please contact this office.      We recommend that you schedule a mammogram for breast cancer screening. Typically, you do not need a referral to do this. Please contact a local imaging center to schedule your mammogram.  [facility] - [phone]  *ask for the Radiology Department [facility] - [phone](336) [phone] or [phone]) [phone]  [facility] - [phone] [facility] - [phone] [facility] - [phone]  *ask for the Radiology Department [facility] - [phone]  *ask for the Radiology Department [facility] - [phone]  *ask for the Mammography Department [facility] - [phone]) [phone]

## 2016-06-20 DIAGNOSIS — Z0271 Encounter for disability determination: Secondary | ICD-10-CM

## 2016-07-01 ENCOUNTER — Ambulatory Visit (INDEPENDENT_AMBULATORY_CARE_PROVIDER_SITE_OTHER): Payer: BLUE CROSS/BLUE SHIELD | Admitting: Physician Assistant

## 2016-07-01 VITALS — BP 130/84 | HR 75 | Temp 98.0°F | Resp 17 | Ht 64.0 in | Wt 178.0 lb

## 2016-07-01 DIAGNOSIS — M109 Gout, unspecified: Secondary | ICD-10-CM | POA: Diagnosis not present

## 2016-07-01 MED ORDER — COLCHICINE 0.6 MG PO TABS: ORAL_TABLET | ORAL | 2 refills | Status: DC

## 2016-07-01 NOTE — Patient Instructions (Signed)
     IF you received an x-ray today, you will receive an invoice from [facility]. Please contact [facility] at [phone] with questions or concerns regarding your invoice.   IF you received labwork today, you will receive an invoice from [facility]. Please contact [facility] at [phone] with questions or concerns regarding your invoice.   Our billing staff will not be able to assist you with questions regarding bills from these companies.  You will be contacted with the lab results as soon as they are available. The fastest way to get your results is to activate your My Chart account. Instructions are located on the last page of this paperwork. If you have not heard from us regarding the results in 2 weeks, please contact this office.      

## 2016-07-01 NOTE — Progress Notes (Signed)
   07/06/2016 1:40 PM   DOB: 1941/02/16 / MRN: [id number]  SUBJECTIVE:  Carmen Brown is a 75 y.o. female presenting for left foot pain that started started last night. Associates redness of the first right MTP and also complains of tenderness about the skin. States "the sheet hurt to touch the toe" last night as well. Denies fever today.   Has been avoiding red meat, seafood, alcohol recently.  Denies any medication changes. She does report she has elevated uric acid levels in the past.   She is allergic to epinephrine and latex.   She  has a past medical history of Allergy; Arthritis; Cardiomegaly; Cardiomyopathy (HCC); Chest pain; Chest pressure; Heart murmur; Hypertension, benign; Menopausal syndrome; Obesity; Osteoarthritis of lower leg, localized; Paroxysmal VT (HCC); and Thyroid disease.    She  reports that she has never smoked. She has never used smokeless tobacco. She reports that she drinks alcohol. She reports that she does not use drugs. She  has no sexual activity history on file. The patient  has a past surgical history that includes ventricular tacticartia and Mass excision (03/15/2012).  Her family history includes Diabetes in her maternal grandfather; Heart disease in her father and mother; Stroke in her maternal grandmother.  Review of Systems  Constitutional: Negative for chills and fever.  Cardiovascular: Negative for chest pain.  Gastrointestinal: Negative for nausea.  Skin: Positive for rash. Negative for itching.  Neurological: Negative for dizziness.  Endo/Heme/Allergies: Negative for polydipsia.    The problem list and medications were reviewed and updated by myself where necessary and exist elsewhere in the encounter.   OBJECTIVE:  BP 130/84 (BP Location: Right Arm, Patient Position: Sitting, Cuff Size: Normal)   Pulse 75   Temp 98 F (36.7 C) (Oral)   Resp 17   Ht 5\' 4"  (1.626 m)   Wt 178 lb (80.7 kg)   SpO2 96%   BMI 30.55 kg/m   Physical Exam    Constitutional: She is oriented to person, place, and time.  Cardiovascular: Normal rate and regular rhythm.   Pulmonary/Chest: Effort normal and breath sounds normal.  Neurological: She is alert and oriented to person, place, and time.  Skin: Skin is warm and dry. Rash (swollen, red, hot left 1st MTP. ) noted.    Lab Results  Component Value Date   CREATININE 1.07 (H) 07/01/2016   No results found for this or any previous visit (from the past 72 hour(s)).  No results found.  ASSESSMENT AND PLAN  Elease Hashimotoatricia was seen today for gout.  Diagnoses and all orders for this visit:  Acute gout involving toe of left foot, unspecified cause -     Uric Acid -     Basic metabolic panel -     colchicine 0.6 MG tablet; Take two tabs and then take another tab 1 hour later. Okay to repeat the next day if needed.    The patient is advised to call or return to clinic if she does not see an improvement in symptoms, or to seek the care of the closest emergency department if she worsens with the above plan.   Deliah BostonMichael Clark, MHS, PA-C [facility] 07/06/2016 1:40 PM

## 2016-07-02 LAB — BASIC METABOLIC PANEL
BUN: 24 mg/dL (ref 7–25)
CHLORIDE: 103 mmol/L (ref 98–110)
CO2: 23 mmol/L (ref 20–31)
CREATININE: 1.07 mg/dL — AB (ref 0.60–0.93)
Calcium: 9.6 mg/dL (ref 8.6–10.4)
Glucose, Bld: 82 mg/dL (ref 65–99)
POTASSIUM: 4.4 mmol/L (ref 3.5–5.3)
Sodium: 140 mmol/L (ref 135–146)

## 2016-07-02 LAB — URIC ACID: Uric Acid, Serum: 7.5 mg/dL — ABNORMAL HIGH (ref 2.5–7.0)

## 2016-07-19 ENCOUNTER — Ambulatory Visit (INDEPENDENT_AMBULATORY_CARE_PROVIDER_SITE_OTHER): Payer: BLUE CROSS/BLUE SHIELD | Admitting: Family Medicine

## 2016-07-19 ENCOUNTER — Encounter: Payer: Self-pay | Admitting: Physician Assistant

## 2016-07-19 VITALS — BP 166/111 | HR 79 | Temp 98.2°F | Resp 18 | Ht 64.5 in | Wt 177.0 lb

## 2016-07-19 DIAGNOSIS — H8110 Benign paroxysmal vertigo, unspecified ear: Secondary | ICD-10-CM | POA: Diagnosis not present

## 2016-07-19 DIAGNOSIS — R030 Elevated blood-pressure reading, without diagnosis of hypertension: Secondary | ICD-10-CM

## 2016-07-19 DIAGNOSIS — H8111 Benign paroxysmal vertigo, right ear: Secondary | ICD-10-CM

## 2016-07-19 MED ORDER — MECLIZINE HCL 25 MG PO TABS
25.0000 mg | ORAL_TABLET | Freq: Three times a day (TID) | ORAL | 0 refills | Status: DC | PRN
Start: 2016-07-19 — End: 2017-01-16

## 2016-07-19 NOTE — Progress Notes (Signed)
Chief Complaint  Patient presents with  . Dizziness    x 3 days    HPI  Vertigo - Dizziness: Patient presents with dizziness .  The dizziness has been present for 3 days. The patient describes the symptoms as vertigo and lightheadedness. Symptoms are exacerbated by rapid head movements, rolling over in bed, rising from supine position and bending The patient also complains of none. Patient denies aural pressure, otalgia, otorrhea, tinnitus, hearing loss.  She has been treated with meclizine (Antivert) with good improvement.  Previous work up has been none.  She states that on Sunday she took zyrtec for allergies and that seemed to trigger her symptoms.  She has taken 5 doses of meclizine without improvement. 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 Allergy   . Arthritis   . Cardiomegaly   . Cardiomyopathy (HCC)   . Chest pain   . Chest pressure   . Heart murmur   . Hypertension, benign    systemic  . Menopausal syndrome   . Obesity   . Osteoarthritis of lower leg, localized   . Paroxysmal VT (HCC)   . Thyroid disease     Current Outpatient Prescriptions  Medication Sig Dispense Refill  . aspirin 81 MG tablet Take 81 mg by mouth daily.      . Biotin 10 MG TABS Take by mouth.    . Cholecalciferol (VITAMIN D3) 2000 UNIT TABS Take 1 tablet by mouth daily.     . Coenzyme Q10 (CO Q 10 PO) Take 50 mg by mouth daily.     . fluticasone (FLONASE) 50 MCG/ACT nasal spray PLACE 2 SPRAYS INTO THE NOSE DAILY. 16 g 6  . Glucosamine Sulfate 750 MG CAPS Take 2 capsules by mouth daily.    Marland Kitchen. lisinopril (PRINIVIL,ZESTRIL) 20 MG tablet Take 1 tablet (20 mg total) by mouth daily. 90 tablet 1    . meclizine (ANTIVERT) 25 MG tablet Take 1 tablet (25 mg total) by mouth 3 (three) times daily as needed for dizziness. 30 tablet 0  . metoprolol succinate (TOPROL-XL) 25 MG 24 hr tablet Take 1 tablet (25 mg total) by mouth daily. 90 tablet 1  . Multiple Vitamin (MULTIVITAMIN) capsule Take 2 capsules by mouth 2 (two) times daily.     . colchicine 0.6 MG tablet Take two tabs and then take another tab 1 hour later. Okay to repeat the next day if needed. 12 tablet 2  . NON FORMULARY (Suprema Dophilus) Take one capsule once a day    . omega-3 acid ethyl esters (LOVAZA) 1 G capsule Take 1 g by mouth 2 (two) times daily.     No current facility-administered medications for this visit.     Allergies:  Allergies  Allergen Reactions  . Epinephrine Other (See Comments)    unknown  . Latex     rash    Past Surgical History:  Procedure Laterality Date  . MASS EXCISION  03/15/2012   Procedure: MINOR EXCISION OF MASS;  Surgeon: Wyn Forsterobert V Sypher Jr., MD;  Location: [facility];  Service: Orthopedics;  Laterality: Right;  debride IP right long finger, excision mucoid cyst  . ventricular tacticartia  Social History   Social History  . Marital status: Single    Spouse name: N/A  . Number of children: N/A  . Years of education: N/A   Social History Main Topics  . Smoking status: Never Smoker  . Smokeless tobacco: Never Used  . Alcohol use Yes     Comment: occasional  . Drug use: No  . Sexual activity: Not Asked   Other Topics Concern  . None   Social History Narrative  . None    Review of Systems  Constitutional: Negative for chills and fever.  HENT: Negative for tinnitus.   Eyes: Positive for double vision and photophobia. Negative for blurred vision.  Respiratory: Negative for cough, shortness of breath and wheezing.   Cardiovascular: Negative for chest pain and palpitations.  Gastrointestinal: Positive for nausea. Negative for abdominal pain and diarrhea.   Neurological: Positive for dizziness.   SEE HPI  Objective: Vitals:   07/19/16 1300  BP: (!) 166/111  Pulse: 79  Resp: 18  Temp: 98.2 F (36.8 C)  TempSrc: Oral  SpO2: 97%  Weight: 177 lb (80.3 kg)  Height: 5' 4.5" (1.638 m)   BP Readings from Last 3 Encounters:  07/19/16 (!) 166/111  07/01/16 130/84  06/08/16 (!) 138/92    Physical Exam  Constitutional: She is oriented to person, place, and time. She appears well-developed and well-nourished.  HENT:  Head: Normocephalic and atraumatic.  Eyes: Conjunctivae and EOM are normal.  Cardiovascular: Normal rate, regular rhythm and normal heart sounds.   No murmur heard. Pulmonary/Chest: Effort normal and breath sounds normal. No respiratory distress. She has no wheezes.  Musculoskeletal: Normal range of motion. She exhibits no edema.  Neurological: She is alert and oriented to person, place, and time. No cranial nerve deficit or sensory deficit.  Worsening dizziness on lateral gaze to the right No nystagmus  Skin: Capillary refill takes less than 2 seconds. No erythema.  Psychiatric: She has a normal mood and affect. Her behavior is normal. Judgment and thought content normal.    Assessment and Plan Carmen Hashimotoatricia was seen today for dizziness.  Diagnoses and all orders for this visit:  Benign paroxysmal positional vertigo of right ear- recommended follow up with PT for vestibular rehab -     Ambulatory referral to Physical Therapy  BPPV (benign paroxysmal positional vertigo), unspecified laterality -     meclizine (ANTIVERT) 25 MG tablet; Take 1 tablet (25 mg total) by mouth 3 (three) times daily as needed for dizziness.  Elevated blood pressure reading- Acutely elevated due to vertigo Will follow up when symptoms resolve     Carmen Brown A Creta LevinStallings

## 2016-07-19 NOTE — Patient Instructions (Addendum)
[facility]- [phone] [facility]- [address] [phone] [facility]      IF you received an x-ray today, you will receive an invoice from [facility]. Please contact [facility] at [phone] with questions or concerns regarding your invoice.   IF you received labwork today, you will receive an invoice from [facility]. Please contact [facility] at [phone] with questions or concerns regarding your invoice.   Our billing staff will not be able to assist you with questions regarding bills from these companies.  You will be contacted with the lab results as soon as they are available. The fastest way to get your results is to activate your My Chart account. Instructions are located on the last page of this paperwork. If you have not heard from us regarding the results in 2 weeks, please contact this office.      Benign Positional Vertigo Introduction Vertigo is the feeling that you or your surroundings are moving when they are not. Benign positional vertigo is the most common form of vertigo. The cause of this condition is not serious (is benign). This condition is triggered by certain movements and positions (is positional). This condition can be dangerous if it occurs while you are doing something that could endanger you or others, such as driving. What are the causes? In many cases, the cause of this condition is not known. It may be caused by a disturbance in an area of the inner ear that helps your brain to sense movement and balance. This disturbance can be caused by a viral infection (labyrinthitis), head injury, or repetitive motion. What increases the risk? This condition is more likely to develop in:  Women.  People who are 73 years of age or older. What are the signs or symptoms? Symptoms of this condition usually happen when you move your head or  your eyes in different directions. Symptoms may start suddenly, and they usually last for less than a minute. Symptoms may include:  Loss of balance and falling.  Feeling like you are spinning or moving.  Feeling like your surroundings are spinning or moving.  Nausea and vomiting.  Blurred vision.  Dizziness.  Involuntary eye movement (nystagmus). Symptoms can be mild and cause only slight annoyance, or they can be severe and interfere with daily life. Episodes of benign positional vertigo may return (recur) over time, and they may be triggered by certain movements. Symptoms may improve over time. How is this diagnosed? This condition is usually diagnosed by medical history and a physical exam of the head, neck, and ears. You may be referred to a health care provider who specializes in ear, nose, and throat (ENT) problems (otolaryngologist) or a provider who specializes in disorders of the nervous system (neurologist). You may have additional testing, including:  MRI.  A CT scan.  Eye movement tests. Your health care provider may ask you to change positions quickly while he or she watches you for symptoms of benign positional vertigo, such as nystagmus. Eye movement may be tested with an electronystagmogram (ENG), caloric stimulation, the Dix-Hallpike test, or the roll test.  An electroencephalogram (EEG). This records electrical activity in your brain.  Hearing tests. How is this treated? Usually, your health care provider will treat this by moving your head in specific positions to adjust your inner ear back to normal. Surgery may be needed in severe cases, but this is rare. In some cases, benign positional vertigo may resolve  on its own in 2-4 weeks. Follow these instructions at home: Safety  Move slowly.Avoid sudden body or head movements.  Avoid driving.  Avoid operating heavy machinery.  Avoid doing any tasks that would be dangerous to you or others if a vertigo episode  would occur.  If you have trouble walking or keeping your balance, try using a cane for stability. If you feel dizzy or unstable, sit down right away.  Return to your normal activities as told by your health care provider. Ask your health care provider what activities are safe for you. General instructions  Take over-the-counter and prescription medicines only as told by your health care provider.  Avoid certain positions or movements as told by your health care provider.  Drink enough fluid to keep your urine clear or pale yellow.  Keep all follow-up visits as told by your health care provider. This is important. Contact a health care provider if:  You have a fever.  Your condition gets worse or you develop new symptoms.  Your family or friends notice any behavioral changes.  Your nausea or vomiting gets worse.  You have numbness or a "pins and needles" sensation. Get help right away if:  You have difficulty speaking or moving.  You are always dizzy.  You faint.  You develop severe headaches.  You have weakness in your legs or arms.  You have changes in your hearing or vision.  You develop a stiff neck.  You develop sensitivity to light. This information is not intended to replace advice given to you by your health care provider. Make sure you discuss any questions you have with your health care provider. Document Released: 05/09/2006 Document Revised: 01/07/2016 Document Reviewed: 11/24/2014  2017 Elsevier

## 2016-10-19 ENCOUNTER — Other Ambulatory Visit: Payer: Self-pay | Admitting: Family Medicine

## 2016-10-19 DIAGNOSIS — I10 Essential (primary) hypertension: Secondary | ICD-10-CM

## 2016-10-20 ENCOUNTER — Telehealth: Payer: Self-pay | Admitting: Family Medicine

## 2016-10-20 ENCOUNTER — Other Ambulatory Visit: Payer: Self-pay | Admitting: Family Medicine

## 2016-10-20 DIAGNOSIS — I10 Essential (primary) hypertension: Secondary | ICD-10-CM

## 2016-10-20 MED ORDER — METOPROLOL SUCCINATE ER 25 MG PO TB24: 25.0000 mg | ORAL_TABLET | Freq: Every day | ORAL | 0 refills | Status: DC

## 2016-10-20 MED ORDER — LISINOPRIL 20 MG PO TABS: 20.0000 mg | ORAL_TABLET | Freq: Every day | ORAL | 0 refills | Status: DC

## 2016-10-20 NOTE — Telephone Encounter (Signed)
Pt called requesting a refill for blood pressure medicine metoprolol and lisinopril. Pt is going out of town on Sunday and said she would not be able to come in for a couple of weeks to be seen. She wanted to know if she could get a refill of these medications until she can come in. Best callback number is [phone].

## 2016-11-13 HISTORY — PX: CARPAL TUNNEL RELEASE: SHX101

## 2017-01-16 ENCOUNTER — Encounter: Payer: Self-pay | Admitting: Physician Assistant

## 2017-01-16 ENCOUNTER — Ambulatory Visit (INDEPENDENT_AMBULATORY_CARE_PROVIDER_SITE_OTHER): Payer: BLUE CROSS/BLUE SHIELD | Admitting: Physician Assistant

## 2017-01-16 VITALS — BP 128/82 | HR 84 | Temp 98.0°F | Resp 18 | Ht 63.78 in | Wt 189.0 lb

## 2017-01-16 DIAGNOSIS — I10 Essential (primary) hypertension: Secondary | ICD-10-CM | POA: Diagnosis not present

## 2017-01-16 LAB — CMP14+EGFR
ALBUMIN: 4.4 g/dL (ref 3.5–4.8)
ALT: 39 IU/L — ABNORMAL HIGH (ref 0–32)
AST: 31 IU/L (ref 0–40)
Albumin/Globulin Ratio: 1.6 (ref 1.2–2.2)
Alkaline Phosphatase: 79 IU/L (ref 39–117)
BUN / CREAT RATIO: 18 (ref 12–28)
BUN: 16 mg/dL (ref 8–27)
Bilirubin Total: 0.3 mg/dL (ref 0.0–1.2)
CALCIUM: 9.2 mg/dL (ref 8.7–10.3)
CO2: 20 mmol/L (ref 18–29)
CREATININE: 0.89 mg/dL (ref 0.57–1.00)
Chloride: 104 mmol/L (ref 96–106)
GFR, EST AFRICAN AMERICAN: 74 mL/min/{1.73_m2} (ref >59)
GFR, EST NON AFRICAN AMERICAN: 64 mL/min/{1.73_m2} (ref >59)
GLOBULIN, TOTAL: 2.8 g/dL (ref 1.5–4.5)
Glucose: 94 mg/dL (ref 65–99)
Potassium: 4.6 mmol/L (ref 3.5–5.2)
SODIUM: 140 mmol/L (ref 134–144)
TOTAL PROTEIN: 7.2 g/dL (ref 6.0–8.5)

## 2017-01-16 MED ORDER — LISINOPRIL 20 MG PO TABS: 20.0000 mg | ORAL_TABLET | Freq: Every day | ORAL | 3 refills | Status: DC

## 2017-01-16 MED ORDER — METOPROLOL SUCCINATE ER 25 MG PO TB24: 25.0000 mg | ORAL_TABLET | Freq: Every day | ORAL | 3 refills | Status: DC

## 2017-01-16 NOTE — Patient Instructions (Signed)
     IF you received an x-ray today, you will receive an invoice from [facility]. Please contact [facility] at [phone] with questions or concerns regarding your invoice.   IF you received labwork today, you will receive an invoice from [facility]. Please contact [facility] at [phone] with questions or concerns regarding your invoice.   Our billing staff will not be able to assist you with questions regarding bills from these companies.  You will be contacted with the lab results as soon as they are available. The fastest way to get your results is to activate your My Chart account. Instructions are located on the last page of this paperwork. If you have not heard from us regarding the results in 2 weeks, please contact this office.     

## 2017-01-16 NOTE — Progress Notes (Signed)
PRIMARY CARE AT [facility] [address] [place] [address] [phone]  Date:  01/16/2017   Name:  Carmen Brown   DOB:  [birth year]   MRN:  [id number]  PCP:  Patient, No Pcp Per    History of Present Illness:  Carmen Brown is a 76 y.o. female patient who presents to PCP with  Chief Complaint  Patient presents with  . Medication Refill    lisinopril, Metoprolol     Chief Complaint  Patient presents with  . Medication Refill    lisinopril, Metoprolol   --medication refill of lisinopril and metoprolol at this time.  She states that she has --no cp, palpitations, leg swellings, or sob.  No vision changes.   Patient Active Problem List   Diagnosis Date Noted  . Osteoarthritis of left knee 06/08/2016  . Osteoarthritis of right knee 06/08/2016  . Bilateral carpal tunnel syndrome 06/08/2016  . Degenerative disc disease, lumbar 04/06/2015  . Chest pressure 11/05/2012  . PAROXYSMAL VENTRICULAR TACHYCARDIA 08/21/2009  . OBESITY, NOS 10/12/2006  . HYPERTENSION, BENIGN SYSTEMIC 10/12/2006  . CARDIOMEGALY 10/12/2006  . RHINITIS, ALLERGIC 10/12/2006  . MENOPAUSAL SYNDROME 10/12/2006  . OSTEOARTHRITIS, LOWER LEG 10/12/2006  . CERVICAL SPINE DISORDER, NOS 10/12/2006    Past Medical History:  Diagnosis Date  . Allergy   . Arthritis   . Cardiomegaly   . Cardiomyopathy ([place])   . Chest pain   . Chest pressure   . Heart murmur   . Hypertension, benign    systemic  . Menopausal syndrome   . Obesity   . Osteoarthritis of lower leg, localized   . Paroxysmal VT ([place])   . Thyroid disease     Past Surgical History:  Procedure Laterality Date  . MASS EXCISION  03/15/2012   Procedure: MINOR EXCISION OF MASS;  Surgeon: Cammie Sickle., MD;  Location: [facility];  Service: Orthopedics;  Laterality: Right;  debride IP right long finger, excision mucoid cyst  . ventricular tacticartia      Social History  Substance Use Topics  . Smoking status: Never Smoker   . Smokeless tobacco: Never Used  . Alcohol use Yes     Comment: occasional    Family History  Problem Relation Age of Onset  . Heart disease Mother   . Heart disease Father   . Stroke Maternal Grandmother   . Diabetes Maternal Grandfather     Allergies  Allergen Reactions  . Epinephrine Other (See Comments)    unknown  . Latex     rash    Medication list has been reviewed and updated.  Current Outpatient Prescriptions on File Prior to Visit  Medication Sig Dispense Refill  . aspirin 81 MG tablet Take 81 mg by mouth daily.      . fluticasone (FLONASE) 50 MCG/ACT nasal spray PLACE 2 SPRAYS INTO THE NOSE DAILY. 16 g 6  . Glucosamine Sulfate 750 MG CAPS Take 2 capsules by mouth daily.    . Multiple Vitamin (MULTIVITAMIN) capsule Take 2 capsules by mouth 2 (two) times daily.      No current facility-administered medications on file prior to visit.     ROS ROS otherwise unremarkable unless listed above.  Physical Examination: BP 128/82   Pulse 84   Temp 98 F (36.7 C) (Oral)   Resp 18   Ht 5' 3.78" (1.62 m)   Wt 189 lb (85.7 kg)   SpO2 98%   BMI 32.67 kg/m  Ideal Body  Weight: Weight in (lb) to have BMI = 25: 144.3  Physical Exam  Constitutional: She is oriented to person, place, and time. She appears well-developed and well-nourished. No distress.  HENT:  Head: Normocephalic and atraumatic.  Right Ear: External ear normal.  Left Ear: External ear normal.  Eyes: Conjunctivae and EOM are normal. Pupils are equal, round, and reactive to light.  Cardiovascular: Normal rate, regular rhythm and normal heart sounds.  Exam reveals no distant heart sounds and no friction rub.   No murmur heard. Pulmonary/Chest: Effort normal. No respiratory distress.  Neurological: She is alert and oriented to person, place, and time.  Skin: She is not diaphoretic.  Psychiatric: She has a normal mood and affect. Her behavior is normal.     Assessment and Plan: Carmen Brown is a  76 y.o. female who is here today for medication refill of the metoprolol and lisinopril. Essential hypertension - Plan: CMP14+EGFR, metoprolol succinate (TOPROL-XL) 25 MG 24 hr tablet, lisinopril (PRINIVIL,ZESTRIL) 20 MG tablet  Stephanie English, PA-C [facility] 6/6/20186:44 PM  

## 2017-01-17 ENCOUNTER — Other Ambulatory Visit: Payer: Self-pay | Admitting: Physician Assistant

## 2017-01-17 DIAGNOSIS — I10 Essential (primary) hypertension: Secondary | ICD-10-CM

## 2017-01-21 ENCOUNTER — Encounter: Payer: Self-pay | Admitting: Emergency Medicine

## 2017-06-07 ENCOUNTER — Telehealth: Payer: Self-pay | Admitting: Physician Assistant

## 2017-06-07 ENCOUNTER — Encounter: Payer: Self-pay | Admitting: Physician Assistant

## 2017-06-07 ENCOUNTER — Ambulatory Visit (INDEPENDENT_AMBULATORY_CARE_PROVIDER_SITE_OTHER): Payer: BLUE CROSS/BLUE SHIELD | Admitting: Physician Assistant

## 2017-06-07 VITALS — HR 68 | Temp 98.3°F | Resp 16 | Ht 63.78 in | Wt 195.6 lb

## 2017-06-07 DIAGNOSIS — G5602 Carpal tunnel syndrome, left upper limb: Secondary | ICD-10-CM | POA: Diagnosis not present

## 2017-06-07 DIAGNOSIS — M170 Bilateral primary osteoarthritis of knee: Secondary | ICD-10-CM | POA: Diagnosis not present

## 2017-06-07 NOTE — Progress Notes (Signed)
06/07/2017 9:54 AM   DOB: [birth year] / MRN: [id number]  SUBJECTIVE:  Carmen Brown is a 76 y.o. female presenting for completion of FMLA for a history of bilateral knee OA and bilateral carpal tunnel.  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.    She has a history of OA in the bilateral knees and this can flare 1-3 times per month. When her knee do hurt she mostly ices and takes Ibuprofen.  This works reasonably well as long as she can rest.   She continues to have carpal tunnel symptoms on the left side and she does receive injections for this. Has had sx release of the right side and she is now symptomatic on that side.   She is allergic to epinephrine and latex.   She  has a past medical history of Allergy; Arthritis; Cardiomegaly; Cardiomyopathy (HCC); Chest pain; Chest pressure; Heart murmur; Hypertension, benign; Menopausal syndrome; Obesity; Osteoarthritis of lower leg, localized; Paroxysmal VT (HCC); and Thyroid disease.    She  reports that she has never smoked. She has never used smokeless tobacco. She reports that she drinks alcohol. She reports that she does not use drugs. She  has no sexual activity history on file. The patient  has a past surgical history that includes ventricular tacticartia; Mass excision (03/15/2012); and Hand surgery (Right, 11/2016).  Her family history includes Diabetes in her maternal grandfather; Heart disease in her father and mother; Stroke in her maternal grandmother.  Review of Systems  Constitutional: Negative for chills, diaphoresis and fever.  Gastrointestinal: Negative for nausea.  Skin: Negative for rash.  Neurological: Negative for dizziness.    The problem list and medications were reviewed and updated by myself where necessary and exist elsewhere in the encounter.   OBJECTIVE:  Pulse 68   Temp 98.3 F (36.8 C) (Oral)   Resp 16   Ht 5' 3.78" (1.62 m)   Wt 195 lb 9.6 oz (88.7 kg)   SpO2 97%   BMI 33.81 kg/m   Physical Exam    Constitutional: She is active.  Non-toxic appearance.  Cardiovascular: Normal rate, regular rhythm, S1 normal, S2 normal, normal heart sounds and intact distal pulses.  Exam reveals no gallop, no friction rub and no decreased pulses.   No murmur heard. Pulmonary/Chest: Effort normal. No stridor. No tachypnea. No respiratory distress. She has no wheezes. She has no rales.  Abdominal: She exhibits no distension.  Musculoskeletal: She exhibits no edema.       Right wrist: Normal.       Left wrist: Normal.       Right knee: Normal.       Left knee: Normal.       Legs: Neurological: She is alert.  Skin: Skin is warm and dry. She is not diaphoretic. No pallor.    Bilateral knee rads from 12/20/13  FINDINGS: Chronic medial compartment joint loss bilaterally. Bilateral medial compartment osteophytosis appears mildly progressed. Lateral joint spaces preserved. No definite joint effusion. Patellofemoral spurring worse on the right. Bone mineralization is within normal limits for age. No acute fracture or dislocation.  IMPRESSION: 1. Chronic medial compartment joint degeneration has mildly progressed in both knees since 2012. 2. Moderate patellofemoral compartment disease on the right.    No results found for this or any previous visit (from the past 72 hour(s)).  No results found.  ASSESSMENT AND PLAN:  Carmen Hashimotoatricia was seen today for paperwork.  Diagnoses and all orders for this visit:  Primary  osteoarthritis of both knees:She is managing both conditions well. FMLA forms completed for her today to ensure that rest remains a valuable part of her treatment plan.    Carpal tunnel syndrome on left    The patient is advised to call or return to clinic if she does not see an improvement in symptoms, or to seek the care of the closest emergency department if she worsens with the above plan.   [name], MHS, PA-C Primary Care at [facility] 06/07/2017 9:54  AM

## 2017-06-07 NOTE — Telephone Encounter (Signed)
Patient had and appt today for FMLA paperwork to be completed. Pt took paperwork with her in the back with the provider.   Again paperwork was not put in FMLA box because she took paperwork to the provider.

## 2017-06-07 NOTE — Patient Instructions (Signed)
Keep a BP log at home.  If pressures are consistently greater than 150/90 then come back to the office with the diary.

## 2017-07-03 DIAGNOSIS — Z0271 Encounter for disability determination: Secondary | ICD-10-CM

## 2017-09-07 ENCOUNTER — Ambulatory Visit: Payer: Self-pay | Admitting: *Deleted

## 2017-09-07 ENCOUNTER — Encounter (HOSPITAL_COMMUNITY): Payer: Self-pay | Admitting: *Deleted

## 2017-09-07 ENCOUNTER — Other Ambulatory Visit: Payer: Self-pay

## 2017-09-07 ENCOUNTER — Emergency Department (HOSPITAL_COMMUNITY): Payer: BLUE CROSS/BLUE SHIELD

## 2017-09-07 ENCOUNTER — Inpatient Hospital Stay (HOSPITAL_COMMUNITY)
Admission: EM | Admit: 2017-09-07 | Discharge: 2017-09-09 | DRG: 247 | Disposition: A | Discharge status: Home / Self Care | Payer: BLUE CROSS/BLUE SHIELD | Attending: Cardiology | Admitting: Cardiology

## 2017-09-07 DIAGNOSIS — J309 Allergic rhinitis, unspecified: Secondary | ICD-10-CM | POA: Diagnosis not present

## 2017-09-07 DIAGNOSIS — Z8679 Personal history of other diseases of the circulatory system: Secondary | ICD-10-CM | POA: Diagnosis not present

## 2017-09-07 DIAGNOSIS — Z79899 Other long term (current) drug therapy: Secondary | ICD-10-CM

## 2017-09-07 DIAGNOSIS — Z7982 Long term (current) use of aspirin: Secondary | ICD-10-CM

## 2017-09-07 DIAGNOSIS — M5136 Other intervertebral disc degeneration, lumbar region: Secondary | ICD-10-CM | POA: Diagnosis present

## 2017-09-07 DIAGNOSIS — I2510 Atherosclerotic heart disease of native coronary artery without angina pectoris: Secondary | ICD-10-CM

## 2017-09-07 DIAGNOSIS — I200 Unstable angina: Secondary | ICD-10-CM | POA: Diagnosis present

## 2017-09-07 DIAGNOSIS — Z6831 Body mass index (BMI) 31.0-31.9, adult: Secondary | ICD-10-CM

## 2017-09-07 DIAGNOSIS — Z888 Allergy status to other drugs, medicaments and biological substances status: Secondary | ICD-10-CM

## 2017-09-07 DIAGNOSIS — Z91040 Latex allergy status: Secondary | ICD-10-CM

## 2017-09-07 DIAGNOSIS — I25110 Atherosclerotic heart disease of native coronary artery with unstable angina pectoris: Secondary | ICD-10-CM | POA: Diagnosis not present

## 2017-09-07 DIAGNOSIS — I10 Essential (primary) hypertension: Secondary | ICD-10-CM | POA: Diagnosis not present

## 2017-09-07 DIAGNOSIS — Z8249 Family history of ischemic heart disease and other diseases of the circulatory system: Secondary | ICD-10-CM | POA: Diagnosis not present

## 2017-09-07 DIAGNOSIS — R079 Chest pain, unspecified: Secondary | ICD-10-CM | POA: Diagnosis not present

## 2017-09-07 DIAGNOSIS — M170 Bilateral primary osteoarthritis of knee: Secondary | ICD-10-CM | POA: Diagnosis not present

## 2017-09-07 DIAGNOSIS — Z955 Presence of coronary angioplasty implant and graft: Secondary | ICD-10-CM

## 2017-09-07 DIAGNOSIS — E669 Obesity, unspecified: Secondary | ICD-10-CM | POA: Diagnosis present

## 2017-09-07 LAB — CBC
HCT: 43.1 % (ref 36.0–46.0)
Hemoglobin: 14.7 g/dL (ref 12.0–15.0)
MCH: 32.7 pg (ref 26.0–34.0)
MCHC: 34.1 g/dL (ref 30.0–36.0)
MCV: 96 fL (ref 78.0–100.0)
Platelets: 204 10*3/uL (ref 150–400)
RBC: 4.49 MIL/uL (ref 3.87–5.11)
RDW: 12.6 % (ref 11.5–15.5)
WBC: 5.8 10*3/uL (ref 4.0–10.5)

## 2017-09-07 LAB — BASIC METABOLIC PANEL
ANION GAP: 10 (ref 5–15)
BUN: 18 mg/dL (ref 6–20)
CALCIUM: 9.3 mg/dL (ref 8.9–10.3)
CO2: 23 mmol/L (ref 22–32)
CREATININE: 1.08 mg/dL — AB (ref 0.44–1.00)
Chloride: 107 mmol/L (ref 101–111)
GFR calc non Af Amer: 49 mL/min — ABNORMAL LOW (ref >60)
GFR, EST AFRICAN AMERICAN: 57 mL/min — AB (ref >60)
Glucose, Bld: 142 mg/dL — ABNORMAL HIGH (ref 65–99)
Potassium: 4.2 mmol/L (ref 3.5–5.1)
SODIUM: 140 mmol/L (ref 135–145)

## 2017-09-07 LAB — TROPONIN I

## 2017-09-07 LAB — I-STAT TROPONIN, ED
TROPONIN I, POC: 0 ng/mL (ref 0.00–0.08)
TROPONIN I, POC: 0 ng/mL (ref 0.00–0.08)

## 2017-09-07 MED ORDER — ASPIRIN 81 MG PO CHEW
81.0000 mg | CHEWABLE_TABLET | ORAL | Status: AC
  Administered 2017-09-08: 81 mg via ORAL
  Filled 2017-09-07: qty 1

## 2017-09-07 MED ORDER — ASPIRIN EC 81 MG PO TBEC
81.0000 mg | DELAYED_RELEASE_TABLET | Freq: Every day | ORAL | Status: DC
  Administered 2017-09-08 – 2017-09-09 (×2): 81 mg via ORAL
  Filled 2017-09-07 (×2): qty 1

## 2017-09-07 MED ORDER — METOPROLOL TARTRATE 25 MG PO TABS
25.0000 mg | ORAL_TABLET | Freq: Once | ORAL | Status: AC
Start: 2017-09-07 — End: 2017-09-07
  Administered 2017-09-07: 25 mg via ORAL
  Filled 2017-09-07: qty 1

## 2017-09-07 MED ORDER — ACETAMINOPHEN 325 MG PO TABS: 650.0000 mg | ORAL_TABLET | ORAL | Status: DC | PRN

## 2017-09-07 MED ORDER — ATORVASTATIN CALCIUM 80 MG PO TABS
80.0000 mg | ORAL_TABLET | Freq: Every day | ORAL | Status: DC
  Administered 2017-09-08: 80 mg via ORAL
  Filled 2017-09-07 (×2): qty 1

## 2017-09-07 MED ORDER — METOPROLOL SUCCINATE ER 25 MG PO TB24
25.0000 mg | ORAL_TABLET | Freq: Every day | ORAL | Status: DC
  Administered 2017-09-08 – 2017-09-09 (×2): 25 mg via ORAL
  Filled 2017-09-07 (×2): qty 1

## 2017-09-07 MED ORDER — NITROGLYCERIN 0.4 MG SL SUBL: 0.4000 mg | SUBLINGUAL_TABLET | SUBLINGUAL | Status: DC | PRN

## 2017-09-07 MED ORDER — SODIUM CHLORIDE 0.9% FLUSH: 3.0000 mL | INTRAVENOUS | Status: DC | PRN

## 2017-09-07 MED ORDER — GI COCKTAIL ~~LOC~~
30.0000 mL | Freq: Once | ORAL | Status: AC
  Administered 2017-09-07: 30 mL via ORAL
  Filled 2017-09-07: qty 30

## 2017-09-07 MED ORDER — LISINOPRIL 10 MG PO TABS
20.0000 mg | ORAL_TABLET | Freq: Every day | ORAL | Status: DC
  Administered 2017-09-07 – 2017-09-09 (×3): 20 mg via ORAL
  Filled 2017-09-07: qty 1
  Filled 2017-09-07: qty 2
  Filled 2017-09-07: qty 1

## 2017-09-07 MED ORDER — SODIUM CHLORIDE 0.9 % WEIGHT BASED INFUSION
1.0000 mL/kg/h | INTRAVENOUS | Status: DC
  Administered 2017-09-08: 1 mL/kg/h via INTRAVENOUS

## 2017-09-07 MED ORDER — ASPIRIN 81 MG PO CHEW
324.0000 mg | CHEWABLE_TABLET | Freq: Once | ORAL | Status: AC
Start: 2017-09-07 — End: 2017-09-07
  Administered 2017-09-07: 324 mg via ORAL
  Filled 2017-09-07: qty 4

## 2017-09-07 MED ORDER — SODIUM CHLORIDE 0.9 % IV SOLN: 250.0000 mL | INTRAVENOUS | Status: DC | PRN

## 2017-09-07 MED ORDER — HEPARIN BOLUS VIA INFUSION
4000.0000 [IU] | Freq: Once | INTRAVENOUS | Status: AC
  Administered 2017-09-07: 4000 [IU] via INTRAVENOUS
  Filled 2017-09-07: qty 4000

## 2017-09-07 MED ORDER — SODIUM CHLORIDE 0.9 % WEIGHT BASED INFUSION
3.0000 mL/kg/h | INTRAVENOUS | Status: DC
  Administered 2017-09-08: 3 mL/kg/h via INTRAVENOUS

## 2017-09-07 MED ORDER — HEPARIN (PORCINE) IN NACL 100-0.45 UNIT/ML-% IJ SOLN: 12.0000 [IU]/kg/h | INTRAMUSCULAR | Status: DC

## 2017-09-07 MED ORDER — HEPARIN (PORCINE) IN NACL 100-0.45 UNIT/ML-% IJ SOLN
1100.0000 [IU]/h | INTRAMUSCULAR | Status: DC
  Administered 2017-09-07: 900 [IU]/h via INTRAVENOUS
  Filled 2017-09-07: qty 250

## 2017-09-07 MED ORDER — ASPIRIN 81 MG PO TABS: 81.0000 mg | ORAL_TABLET | Freq: Every day | ORAL | Status: DC

## 2017-09-07 MED ORDER — SODIUM CHLORIDE 0.9% FLUSH
3.0000 mL | Freq: Two times a day (BID) | INTRAVENOUS | Status: DC
  Administered 2017-09-07 – 2017-09-08 (×2): 3 mL via INTRAVENOUS

## 2017-09-07 MED ORDER — LISINOPRIL 10 MG PO TABS
10.0000 mg | ORAL_TABLET | Freq: Once | ORAL | Status: AC
Start: 2017-09-07 — End: 2017-09-07
  Administered 2017-09-07: 10 mg via ORAL
  Filled 2017-09-07: qty 1

## 2017-09-07 NOTE — Progress Notes (Signed)
ANTICOAGULATION CONSULT NOTE - Initial Consult  Pharmacy Consult for Heparin Indication: chest pain/ACS  Allergies  Allergen Reactions  . Epinephrine Other (See Comments)    unknown  . Latex     rash    Patient Measurements: Height: 5\' 6"  (167.6 cm) Weight: 185 lb (83.9 kg) IBW/kg (Calculated) : 59.3 Heparin Dosing Weight: 77.1kg  Vital Signs: Temp: 99.8 F (37.7 C) (01/24 1322) Temp Source: Oral (01/24 1322) BP: 152/86 (01/24 1745) Pulse Rate: 83 (01/24 1751)  Labs: Recent Labs    09/07/17 1330  HGB 14.7  HCT 43.1  PLT 204  CREATININE 1.08*    Estimated Creatinine Clearance: 49.1 mL/min (A) (by C-G formula based on SCr of 1.08 mg/dL (H)).   Medical History: Past Medical History:  Diagnosis Date  . Allergy   . Arthritis   . Cardiomegaly   . Cardiomyopathy (HCC)   . Chest pain   . Chest pressure   . Heart murmur   . Hypertension, benign    systemic  . Menopausal syndrome   . Obesity   . Osteoarthritis of lower leg, localized   . Paroxysmal VT (HCC)   . Thyroid disease     Medications:  Not on any anticoagulant PTA  Assessment: 77yof presented to ED for chest pain. Pharmacy consulted for heparin r/o ACS. Negative trop x2. Scr and CBC WNL. No bleeding noted.  Goal of Therapy:  Heparin level 0.3-0.7 units/ml Monitor platelets by anticoagulation protocol: Yes   Plan:  Give 4000 units bolus x 1 Start heparin infusion at 900 units/hr 8 hour heparin level Daily HL and CBC  Carmen Brown 09/07/2017,6:10 PM

## 2017-09-07 NOTE — ED Triage Notes (Signed)
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 Has sob and chest tightness with mild exertion. Denies leg swelling or recent cough. No resp distress is noted at triage.

## 2017-09-07 NOTE — Telephone Encounter (Signed)
Pt states she is feeling pressure in the middle of her chest between her breast. Pt denies that she is feeling pain and just states it is pressure that is now constant. Pt states she originally experienced the pressure in her chest on Tuesday that was intermittent but now the pressure is constant. Pt denies radiation of pain to other areas of the body, nausea, sweating or any other symptom. Pt advised to go to ED and pt states states she will go at this time.   Reason for Disposition . [1] Chest pain lasts > 5 minutes AND [2] age > 30 AND [3] at least one cardiac risk factor (i.e., hypertension, diabetes, obesity, smoker or strong family history of heart disease)  Answer Assessment - Initial Assessment Questions 1. LOCATION: "Where does it hurt?"       Middle of chest between breast 2. RADIATION: "Does the pain go anywhere else?" (e.g., into neck, jaw, arms, back)    No 3. ONSET: "When did the chest pain begin?" (Minutes, hours or days)      Originally on Tuesday 4. PATTERN "Does the pain come and go, or has it been constant since it started?"  "Does it get worse with exertion?"      Constant at this time 5. DURATION: "How long does it last" (e.g., seconds, minutes, hours)     Now it is constant, on Tuesday it came and went 6. SEVERITY: "How bad is the pain?"  (e.g., Scale 1-10; mild, moderate, or severe)    - MILD (1-3): doesn't interfere with normal activities     - MODERATE (4-7): interferes with normal activities or awakens from sleep    - SEVERE (8-10): excruciating pain, unable to do any normal activities       Pt states she does not have pain, just pressure 7. CARDIAC RISK FACTORS: "Do you have any history of heart problems or risk factors for heart disease?" (e.g., prior heart attack, angina; high blood pressure, diabetes, being overweight, high cholesterol, smoking, or strong family history of heart disease)     HTN, family heart disease 8. PULMONARY RISK FACTORS: "Do you have any  history of lung disease?"  (e.g., blood clots in lung, asthma, emphysema, birth control pills)     No 9. CAUSE: "What do you think is causing the chest pain?"     Does not know 10. OTHER SYMPTOMS: "Do you have any other symptoms?" (e.g., dizziness, nausea, vomiting, sweating, fever, difficulty breathing, cough)       No  Protocols used: CHEST PAIN-A-AH

## 2017-09-07 NOTE — H&P (Signed)
CARDIOLOGY ADMISSION NOTE  Patient ID: Carmen Brown MRN: [id number] DOB/AGE: Oct 23, 1940 77 y.o.  Admit date: 09/07/2017   Primary Physician   Patient, No Pcp Per Primary Cardiologist   Lewayne Bunting, MD/(New:  Dr. Antoine Poche) Chief Complaint    Chest pain  HPI:  The patient has a past history of monomorphic VT.  She saw Dr. [name] .  She saw him in 2015.   She has had a reduced EF (30 - 35%) but this normalized with the last echo in 2009.    She is a Financial controller.  She had been feeling well until Tuesday and she noticed that she had chest pain pulling luggage.  This has progressed.  She was having this discomfort more easily over the next couple of days and this morning it happened with minimal exertion.  It was a burning.  There was mild SOB.  She had no radiation.  She had no other associated symptoms.  She does not know of having pain like this before.   In the ED there have not been acute EKG changes and her first set of enzymes were negative and EKG was non acute.   Past Medical History:  Diagnosis Date  . Allergy   . Arthritis   . Cardiomegaly   . Cardiomyopathy (HCC)    EF 35% in the distant past but NL EF 2009  . Hypertension, benign    systemic  . Menopausal syndrome   . Obesity   . Osteoarthritis of lower leg, localized   . Paroxysmal VT (HCC)   . Thyroid disease     Past Surgical History:  Procedure Laterality Date  . CARPAL TUNNEL RELEASE Right 11/2016  . MASS EXCISION  03/15/2012   Procedure: MINOR EXCISION OF MASS;  Surgeon: Wyn Forster., MD;  Location: [facility];  Service: Orthopedics;  Laterality: Right;  debride IP right long finger, excision mucoid cyst  . ventricular tacticartia      Allergies  Allergen Reactions  . Epinephrine Other (See Comments)    unknown  . Latex     rash   No current facility-administered medications on file prior to encounter.    Current Outpatient Medications on File Prior to Encounter  Medication  Sig Dispense Refill  . aspirin 81 MG tablet Take 81 mg by mouth daily.      . fluticasone (FLONASE) 50 MCG/ACT nasal spray PLACE 2 SPRAYS INTO THE NOSE DAILY. 16 g 6  . Glucosamine Sulfate 750 MG CAPS Take 2 capsules by mouth daily.    Marland Kitchen lisinopril (PRINIVIL,ZESTRIL) 20 MG tablet Take 1 tablet (20 mg total) by mouth daily. 90 tablet 3  . metoprolol succinate (TOPROL-XL) 25 MG 24 hr tablet Take 1 tablet (25 mg total) by mouth daily. 90 tablet 3  . Multiple Vitamin (MULTIVITAMIN) capsule Take 2 capsules by mouth 2 (two) times daily.      Social History   Socioeconomic History  . Marital status: Single    Spouse name: Not on file  . Number of children: Not on file  . Years of education: Not on file  . Highest education level: Not on file  Social Needs  . Financial resource strain: Not on file  . Food insecurity - worry: Not on file  . Food insecurity - inability: Not on file  . Transportation needs - medical: Not on file  . Transportation needs - non-medical: Not on file  Occupational History  . Not on file  Tobacco Use  . Smoking status: Never Smoker  . Smokeless tobacco: Never Used  Substance and Sexual Activity  . Alcohol use: Yes    Comment: occasional  . Drug use: No  . Sexual activity: Not on file  Other Topics Concern  . 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     Family History  Problem Relation Age of Onset  . Heart disease Mother 38       MI  . Heart disease Father 42       CABG  . CAD Sister 67       Stent  . Lymphoma Sister   . Stroke Maternal Grandmother   . Diabetes Maternal Grandfather      ROS:  As stated in the HPI and negative for all other systems.   Physical Exam: Blood pressure (!) 154/111, pulse 83, temperature 99.8 F (37.7 C), temperature source Oral, resp. rate 14, height 5\' 6"  (1.676 m), weight 185 lb (83.9 kg), SpO2 98 %.  GENERAL:  Well appearing and looks much younger than stated age.  HEENT:  Pupils  equal round and reactive, fundi not visualized, oral mucosa unremarkable NECK:  No jugular venous distention, waveform within normal limits, carotid upstroke brisk and symmetric, no bruits, no thyromegaly LYMPHATICS:  No cervical, inguinal adenopathy LUNGS:  Clear to auscultation bilaterally BACK:  No CVA tenderness CHEST:  Unremarkable HEART:  PMI not displaced or sustained,S1 and S2 within normal limits, no S3, no S4, no clicks, no rubs, no murmurs ABD:  Flat, positive bowel sounds normal in frequency in pitch, no bruits, no rebound, no guarding, no midline pulsatile mass, no hepatomegaly, no splenomegaly EXT:  2 plus pulses throughout, no edema, no cyanosis no clubbing SKIN:  No rashes no nodules NEURO:  Cranial nerves II through XII grossly intact, motor grossly intact throughout PSYCH:  Cognitively intact, oriented to person place and time  Labs: Lab Results  Component Value Date   BUN 18 09/07/2017   Lab Results  Component Value Date   CREATININE 1.08 (H) 09/07/2017   Lab Results  Component Value Date   NA 140 09/07/2017   K 4.2 09/07/2017   CL 107 09/07/2017   CO2 23 09/07/2017   Lab Results  Component Value Date   TROPONINI <0.30 11/05/2012   Lab Results  Component Value Date   WBC 5.8 09/07/2017   HGB 14.7 09/07/2017   HCT 43.1 09/07/2017   MCV 96.0 09/07/2017   PLT 204 09/07/2017   Lab Results  Component Value Date   CHOL 194 04/25/2016   HDL 51 04/25/2016   LDLCALC 90 04/25/2016   TRIG 265 (H) 04/25/2016   CHOLHDL 3.8 04/25/2016   Lab Results  Component Value Date   ALT 39 (H) 01/16/2017   AST 31 01/16/2017   ALKPHOS 79 01/16/2017   BILITOT 0.3 01/16/2017      Radiology:  CXR:  FINDINGS: The lungs are adequately inflated. There patchy increased density in the right infrahilar region. There is no pleural effusion. The heart and pulmonary vascularity are normal. The mediastinum is normal in width. There calcification in the wall of the aortic  arch.  EKG:  NSR, rate 83, PVCs, no acute ST T wave changes.  Axis and intervals WNL.  09/07/2017   ASSESSMENT AND PLAN:    CHEST PAIN:  Pain is suspicious for new onset exertional angina with an accelerated pattern.  Possible unstable angina.  Pre test probability  of obstructive CAD is high.  Cath is indicated.  The patient understands that risks included but are not limited to stroke (1 in 1000), death (1 in 1000), kidney failure [usually temporary] (1 in 500), bleeding (1 in 200), allergic reaction [possibly serious] (1 in 200).  The patient understands and agrees to proceed.   MONOMORPHIC VTACH:  No symptoms with this no further testing.  HTN:  BP up in the ED.  Follow and she will likely need adjustment to her meds before discharge.  She does not watch her BP at home.  RISK REDUCTION:  Check Lipids.     SignedRollene Rotunda: Alahna Dunne 09/07/2017, 6:32 PM

## 2017-09-07 NOTE — ED Provider Notes (Signed)
[facility] EMERGENCY DEPARTMENT Provider Note   CSN: [id number] Arrival date & time: 09/07/17  1314     History   Chief Complaint Chief Complaint  Patient presents with  . Chest Pain    HPI HEAVENLEIGH PETRUZZI is a 77 y.o. female.  HPI  77 year old female, she presents to the hospital with a complaint of chest pressure.  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  He does have high blood pressure and takes daily medications though she has not had any today, she does not take aspirin.  He denies having any obstructive coronary disease, she is not sure if she has had a heart catheterization though she thinks she has, she has not had any recent stress testing and has not had any reason for that as she has been asymptomatic until several days ago when she started to get dyspneic and have a heaviness in her chest that she walked between [place] at the airport between flights.  Over the last 2 days it has become worsened, today she was having increasing pressure with even moving around the house, the symptoms do go away when she rests.  She has not had any swelling of her legs, she denies having any coughing or fevers, but she does state that she does mostly domestic flights and is on her feet working most of the flight.  Recent surgery, no estrogen use, no tobacco use, no history of cholesterol diabetes.  Cardiology:  Dr. [name]  Review of the medical record and the cardiology notes states that she did have monomorphic VT with mild LV dysfunction but that had normalized, she is no longer on amiodarone.  Reportedly was to have a stress test in 2015, I have no results of that  study.  Past Medical History:  Diagnosis Date  . Allergy   . Arthritis   . Cardiomegaly   . Cardiomyopathy (HCC)   . Chest pain   . Chest pressure   . Heart murmur   . Hypertension, benign    systemic  . Menopausal syndrome   . Obesity   . Osteoarthritis of lower leg, localized   . Paroxysmal VT (HCC)   . Thyroid disease     Patient Active Problem List   Diagnosis Date Noted  . Osteoarthritis of left knee 06/08/2016  . Osteoarthritis of right knee 06/08/2016  . Bilateral carpal tunnel syndrome 06/08/2016  . Degenerative disc disease, lumbar 04/06/2015  . Chest pressure 11/05/2012  . PAROXYSMAL VENTRICULAR TACHYCARDIA 08/21/2009  . OBESITY, NOS 10/12/2006  . HYPERTENSION, BENIGN SYSTEMIC 10/12/2006  . CARDIOMEGALY 10/12/2006  . RHINITIS, ALLERGIC 10/12/2006  . MENOPAUSAL SYNDROME 10/12/2006  . OSTEOARTHRITIS, LOWER LEG 10/12/2006  . CERVICAL SPINE DISORDER, NOS 10/12/2006    Past Surgical History:  Procedure Laterality Date  . HAND SURGERY Right 11/2016  . MASS EXCISION  03/15/2012   Procedure: MINOR EXCISION OF MASS;  Surgeon: Wyn Forster., MD;  Location: [facility];  Service: Orthopedics;  Laterality: Right;  debride IP right long finger, excision mucoid cyst  . ventricular tacticartia      OB History    No data available  Home Medications    Prior to Admission medications   Medication Sig Start Date End Date Taking? Authorizing Provider  aspirin 81 MG tablet Take 81 mg by mouth daily.      [provider]  fluticasone (FLONASE) 50 MCG/ACT nasal spray PLACE 2 SPRAYS INTO THE NOSE DAILY. 04/25/16   Shade Flood, MD  Glucosamine Sulfate 750 MG CAPS Take 2 capsules by mouth daily.    [provider]  lisinopril (PRINIVIL,ZESTRIL) 20 MG tablet Take 1 tablet (20 mg total) by mouth daily. 01/16/17   Trena Platt D, PA  metoprolol succinate (TOPROL-XL) 25 MG 24 hr tablet Take 1 tablet (25 mg total) by mouth daily.  01/16/17   Trena Platt D, PA  Multiple Vitamin (MULTIVITAMIN) capsule Take 2 capsules by mouth 2 (two) times daily.     [provider]    Family History Family History  Problem Relation Age of Onset  . Heart disease Mother   . Heart disease Father   . Stroke Maternal Grandmother   . Diabetes Maternal Grandfather     Social History Social History   Tobacco Use  . Smoking status: Never Smoker  . Smokeless tobacco: Never Used  Substance Use Topics  . Alcohol use: Yes    Comment: occasional  . Drug use: No     Allergies   Epinephrine and Latex   Review of Systems Review of Systems  All other systems reviewed and are negative.    Physical Exam Updated Vital Signs BP (!) 142/92 Comment: pt did not take B/P meds  Pulse 85   Temp 99.8 F (37.7 C) (Oral)   Resp 17   Ht 5\' 6"  (1.676 m)   Wt 83.9 kg (185 lb)   SpO2 97%   BMI 29.86 kg/m   Physical Exam  Constitutional: She appears well-developed and well-nourished. No distress.  HENT:  Head: Normocephalic and atraumatic.  Mouth/Throat: Oropharynx is clear and moist. No oropharyngeal exudate.  Eyes: Conjunctivae and EOM are normal. Pupils are equal, round, and reactive to light. Right eye exhibits no discharge. Left eye exhibits no discharge. No scleral icterus.  Neck: Normal range of motion. Neck supple. No JVD present. No thyromegaly present.  Cardiovascular: Normal rate, regular rhythm, normal heart sounds and intact distal pulses. Exam reveals no gallop and no friction rub.  No murmur heard. Pulmonary/Chest: Effort normal and breath sounds normal. No respiratory distress. She has no wheezes. She has no rales.  Abdominal: Soft. Bowel sounds are normal. She exhibits no distension and no mass. There is no tenderness.  Musculoskeletal: Normal range of motion. She exhibits no edema or tenderness.  Lymphadenopathy:    She has no cervical adenopathy.  Neurological: She is alert. Coordination normal.    Skin: Skin is warm and dry. No rash noted. No erythema.  Psychiatric: She has a normal mood and affect. Her behavior is normal.  Nursing note and vitals reviewed.    ED Treatments / Results  Labs (all labs ordered are listed, but only abnormal results are displayed) Labs Reviewed  BASIC METABOLIC PANEL - Abnormal; Notable for the following components:      Result Value   Glucose, Bld 142 (*)    Creatinine, Ser 1.08 (*)    GFR calc non Af Amer 49 (*)    GFR calc Af Amer 57 (*)    All other components within normal limits  CBC  I-STAT TROPONIN, ED  I-STAT TROPONIN, ED    EKG  EKG  Interpretation  Date/Time:  Thursday September 07 2017 13:22:19 EST Ventricular Rate:  83 PR Interval:  142 QRS Duration: 78 QT Interval:  344 QTC Calculation: 404 R Axis:   55 Text Interpretation:  Sinus rhythm with marked sinus arrhythmia with occasional Premature ventricular complexes Low voltage QRS Cannot rule out Anterior infarct , age undetermined Abnormal ECG Since last tracing ectopy now present Confirmed by Eber HongMiller, Chenille Toor ([id number]) on 09/07/2017 5:15:13 PM       Radiology Dg Chest 2 View  Result Date: 09/07/2017 CLINICAL DATA:  Chest pressure and shortness of breath. History of cardiomyopathy, cardiac dysrhythmia, and hypertension. EXAM: CHEST  2 VIEW COMPARISON:  PA and lateral chest x-ray of May 29, 2014 FINDINGS: The lungs are adequately inflated. There patchy increased density in the right infrahilar region. There is no pleural effusion. The heart and pulmonary vascularity are normal. The mediastinum is normal in width. There calcification in the wall of the aortic arch. IMPRESSION: No evidence of CHF. Subtle increased density in the right infrahilar region may reflect infiltrate, atelectasis, or a pulmonary nodule. Given the patient's symptoms, pneumonia is felt most likely. Followup PA and lateral chest X-ray is recommended in 3-4 weeks following trial of antibiotic therapy to ensure  resolution and exclude underlying malignancy. Thoracic aortic atherosclerosis. Electronically Signed   By: David  SwazilandJordan M.D.   On: 09/07/2017 15:06    Procedures .Critical Care Performed by: Eber HongMiller, Marice Guidone, MD Authorized by: Eber HongMiller, Volanda Mangine, MD   Critical care provider statement:    Critical care time (minutes):  35   Critical care time was exclusive of:  Separately billable procedures and treating other patients and teaching time   Critical care was necessary to treat or prevent imminent or life-threatening deterioration of the following conditions: ACS - Unstable Angina.   Critical care was time spent personally by me on the following activities:  Blood draw for specimens, development of treatment plan with patient or surrogate, discussions with consultants, evaluation of patient's response to treatment, examination of patient, obtaining history from patient or surrogate, ordering and performing treatments and interventions, ordering and review of laboratory studies, ordering and review of radiographic studies, pulse oximetry, re-evaluation of patient's condition and review of old charts   (including critical care time)  Medications Ordered in ED Medications  aspirin chewable tablet 324 mg (not administered)  lisinopril (PRINIVIL,ZESTRIL) tablet 10 mg (not administered)  metoprolol tartrate (LOPRESSOR) tablet 25 mg (not administered)     Initial Impression / Assessment and Plan / ED Course  I have reviewed the triage vital signs and the nursing notes.  Pertinent labs & imaging results that were available during my care of the patient were reviewed by me and considered in my medical decision making (see chart for details).    The x-ray does not show any focal pulmonary edema, if there is an infiltrate it is minimal and the patient has absolutely no signs of pneumonia, I am more concerned with her exertional dyspnea and exertional chest discomfort.  I will discussed with cardiology, she  will get aspirin, she will get antihypertensive medications as well she has not had her meds today.  Current blood pressure is 190/90.  The patient likely needs to be admitted for cardiac rule out and a provocative test.  Concern for unstable Angina  Discussed with Dr. Antoine PocheHochrein - agreeable to admit  I believe the patient does have unstable angina and thus of started heparin, the patient has had escalating episodes of chest discomfort concerning for  that pattern.    Final Clinical Impressions(s) / ED Diagnoses   Final diagnoses:  Unstable angina (HCC)      Eber Hong, MD 09/07/17 1910

## 2017-09-07 NOTE — ED Notes (Signed)
Lab work, radiology results and vital signs reviewed, no critical results at this time, no change in acuity indicated.  

## 2017-09-08 ENCOUNTER — Other Ambulatory Visit: Payer: Self-pay

## 2017-09-08 ENCOUNTER — Inpatient Hospital Stay (HOSPITAL_COMMUNITY)
Admission: EM | Disposition: A | Discharge status: Home / Self Care | Payer: Self-pay | Source: Home / Self Care | Attending: Cardiology

## 2017-09-08 DIAGNOSIS — I200 Unstable angina: Secondary | ICD-10-CM

## 2017-09-08 DIAGNOSIS — I25110 Atherosclerotic heart disease of native coronary artery with unstable angina pectoris: Principal | ICD-10-CM

## 2017-09-08 HISTORY — PX: LEFT HEART CATH AND CORONARY ANGIOGRAPHY: CATH118249

## 2017-09-08 LAB — POCT ACTIVATED CLOTTING TIME
ACTIVATED CLOTTING TIME: 208 s
ACTIVATED CLOTTING TIME: 235 s
Activated Clotting Time: 235 seconds
Activated Clotting Time: 235 seconds
Activated Clotting Time: 274 seconds
Activated Clotting Time: 279 seconds

## 2017-09-08 LAB — BASIC METABOLIC PANEL
Anion gap: 12 (ref 5–15)
BUN: 18 mg/dL (ref 6–20)
CO2: 19 mmol/L — AB (ref 22–32)
CREATININE: 0.91 mg/dL (ref 0.44–1.00)
Calcium: 9.1 mg/dL (ref 8.9–10.3)
Chloride: 107 mmol/L (ref 101–111)
GFR calc non Af Amer: >60 mL/min (ref >60)
Glucose, Bld: 188 mg/dL — ABNORMAL HIGH (ref 65–99)
POTASSIUM: 4.2 mmol/L (ref 3.5–5.1)
Sodium: 138 mmol/L (ref 135–145)

## 2017-09-08 LAB — LIPID PANEL
CHOL/HDL RATIO: 3.9 ratio
Cholesterol: 154 mg/dL (ref 0–200)
HDL: 39 mg/dL — AB (ref >40)
LDL CALC: 95 mg/dL (ref 0–99)
TRIGLYCERIDES: 102 mg/dL (ref <150)
VLDL: 20 mg/dL (ref 0–40)

## 2017-09-08 LAB — SURGICAL PCR SCREEN
MRSA, PCR: NEGATIVE
STAPHYLOCOCCUS AUREUS: NEGATIVE

## 2017-09-08 LAB — CBC
HEMATOCRIT: 39.4 % (ref 36.0–46.0)
Hemoglobin: 13.6 g/dL (ref 12.0–15.0)
MCH: 32.9 pg (ref 26.0–34.0)
MCHC: 34.5 g/dL (ref 30.0–36.0)
MCV: 95.2 fL (ref 78.0–100.0)
Platelets: 182 10*3/uL (ref 150–400)
RBC: 4.14 MIL/uL (ref 3.87–5.11)
RDW: 12.5 % (ref 11.5–15.5)
WBC: 10 10*3/uL (ref 4.0–10.5)

## 2017-09-08 LAB — HEPARIN LEVEL (UNFRACTIONATED): Heparin Unfractionated: 0.14 IU/mL — ABNORMAL LOW (ref 0.30–0.70)

## 2017-09-08 LAB — PROTIME-INR
INR: 1.17
PROTHROMBIN TIME: 14.8 s (ref 11.4–15.2)

## 2017-09-08 LAB — TROPONIN I
Troponin I: <0.03 ng/mL (ref <0.03)
Troponin I: 0.07 ng/mL (ref <0.03)

## 2017-09-08 SURGERY — LEFT HEART CATH AND CORONARY ANGIOGRAPHY
Anesthesia: LOCAL

## 2017-09-08 MED ORDER — HEPARIN SODIUM (PORCINE) 1000 UNIT/ML IJ SOLN
INTRAMUSCULAR | Status: AC
  Filled 2017-09-08: qty 1

## 2017-09-08 MED ORDER — MIDAZOLAM HCL 2 MG/2ML IJ SOLN
INTRAMUSCULAR | Status: AC
  Filled 2017-09-08: qty 2

## 2017-09-08 MED ORDER — VERAPAMIL HCL 2.5 MG/ML IV SOLN
INTRAVENOUS | Status: AC
  Filled 2017-09-08: qty 2

## 2017-09-08 MED ORDER — LABETALOL HCL 5 MG/ML IV SOLN: 10.0000 mg | INTRAVENOUS | Status: AC | PRN

## 2017-09-08 MED ORDER — IOPAMIDOL (ISOVUE-370) INJECTION 76%
INTRAVENOUS | Status: AC
  Filled 2017-09-08: qty 50

## 2017-09-08 MED ORDER — TICAGRELOR 90 MG PO TABS
90.0000 mg | ORAL_TABLET | Freq: Two times a day (BID) | ORAL | Status: DC
  Administered 2017-09-08 – 2017-09-09 (×2): 90 mg via ORAL
  Filled 2017-09-08 (×2): qty 1

## 2017-09-08 MED ORDER — MIDAZOLAM HCL 2 MG/2ML IJ SOLN
INTRAMUSCULAR | Status: DC | PRN
  Administered 2017-09-08 (×2): 1 mg via INTRAVENOUS

## 2017-09-08 MED ORDER — SODIUM CHLORIDE 0.9% FLUSH
3.0000 mL | Freq: Two times a day (BID) | INTRAVENOUS | Status: DC
  Administered 2017-09-08: 3 mL via INTRAVENOUS

## 2017-09-08 MED ORDER — HEPARIN SODIUM (PORCINE) 1000 UNIT/ML IJ SOLN
INTRAMUSCULAR | Status: DC | PRN
  Administered 2017-09-08: 4000 [IU] via INTRAVENOUS
  Administered 2017-09-08 (×2): 4500 [IU] via INTRAVENOUS
  Administered 2017-09-08: 2000 [IU] via INTRAVENOUS

## 2017-09-08 MED ORDER — LIDOCAINE HCL (PF) 1 % IJ SOLN
INTRAMUSCULAR | Status: DC | PRN
  Administered 2017-09-08: 2 mL

## 2017-09-08 MED ORDER — FENTANYL CITRATE (PF) 100 MCG/2ML IJ SOLN
INTRAMUSCULAR | Status: AC
  Filled 2017-09-08: qty 2

## 2017-09-08 MED ORDER — TIROFIBAN HCL IN NACL 5-0.9 MG/100ML-% IV SOLN
INTRAVENOUS | Status: DC | PRN
  Administered 2017-09-08: 0.075 ug/kg/min via INTRAVENOUS

## 2017-09-08 MED ORDER — NITROGLYCERIN 1 MG/10 ML FOR IR/CATH LAB
INTRA_ARTERIAL | Status: DC | PRN
  Administered 2017-09-08 (×3): 200 ug via INTRACORONARY
  Administered 2017-09-08: 150 ug via INTRACORONARY
  Administered 2017-09-08: 200 ug via INTRACORONARY

## 2017-09-08 MED ORDER — SODIUM CHLORIDE 0.9% FLUSH: 3.0000 mL | INTRAVENOUS | Status: DC | PRN

## 2017-09-08 MED ORDER — TICAGRELOR 90 MG PO TABS
ORAL_TABLET | ORAL | Status: DC | PRN
  Administered 2017-09-08: 180 mg via ORAL

## 2017-09-08 MED ORDER — IOPAMIDOL (ISOVUE-370) INJECTION 76%
INTRAVENOUS | Status: AC
  Filled 2017-09-08: qty 100

## 2017-09-08 MED ORDER — ACTIVE PARTNERSHIP FOR HEALTH OF YOUR HEART BOOK
Freq: Once | Status: AC
  Administered 2017-09-08: 1
  Filled 2017-09-08: qty 1

## 2017-09-08 MED ORDER — NITROGLYCERIN 1 MG/10 ML FOR IR/CATH LAB
INTRA_ARTERIAL | Status: AC
  Filled 2017-09-08: qty 10

## 2017-09-08 MED ORDER — TIROFIBAN HCL IN NACL 5-0.9 MG/100ML-% IV SOLN
INTRAVENOUS | Status: AC
  Filled 2017-09-08: qty 100

## 2017-09-08 MED ORDER — ANGIOPLASTY BOOK
Freq: Once | Status: AC
  Administered 2017-09-08: 23:00:00 1
  Filled 2017-09-08: qty 1

## 2017-09-08 MED ORDER — BIVALIRUDIN BOLUS VIA INFUSION - CUPID: INTRAVENOUS | Status: DC | PRN

## 2017-09-08 MED ORDER — TICAGRELOR 90 MG PO TABS
ORAL_TABLET | ORAL | Status: AC
  Filled 2017-09-08: qty 2

## 2017-09-08 MED ORDER — FENTANYL CITRATE (PF) 100 MCG/2ML IJ SOLN
INTRAMUSCULAR | Status: DC | PRN
  Administered 2017-09-08: 50 ug via INTRAVENOUS
  Administered 2017-09-08: 25 ug via INTRAVENOUS

## 2017-09-08 MED ORDER — LIDOCAINE HCL 1 % IJ SOLN
INTRAMUSCULAR | Status: AC
  Filled 2017-09-08: qty 20

## 2017-09-08 MED ORDER — HYDRALAZINE HCL 20 MG/ML IJ SOLN: 5.0000 mg | INTRAMUSCULAR | Status: AC | PRN

## 2017-09-08 MED ORDER — SODIUM CHLORIDE 0.9 % IV SOLN: 250.0000 mL | INTRAVENOUS | Status: DC | PRN

## 2017-09-08 MED ORDER — HEPARIN (PORCINE) IN NACL 2-0.9 UNIT/ML-% IJ SOLN
INTRAMUSCULAR | Status: AC
  Filled 2017-09-08: qty 1000

## 2017-09-08 MED ORDER — SODIUM CHLORIDE 0.9 % IV SOLN
INTRAVENOUS | Status: AC
  Administered 2017-09-08: 14:00:00 via INTRAVENOUS

## 2017-09-08 MED ORDER — ENOXAPARIN SODIUM 40 MG/0.4ML ~~LOC~~ SOLN: 40.0000 mg | SUBCUTANEOUS | Status: DC

## 2017-09-08 MED ORDER — VERAPAMIL HCL 2.5 MG/ML IV SOLN
INTRAVENOUS | Status: DC | PRN
  Administered 2017-09-08: 10 mL via INTRA_ARTERIAL

## 2017-09-08 MED ORDER — TIROFIBAN (AGGRASTAT) BOLUS VIA INFUSION
INTRAVENOUS | Status: DC | PRN
  Administered 2017-09-08: 2150 ug via INTRAVENOUS

## 2017-09-08 SURGICAL SUPPLY — 18 items
BALLN EMERGE MR 2.25X12 (BALLOONS) ×2
BALLN ~~LOC~~ EMERGE MR 3.25X20 (BALLOONS) ×2
BALLOON EMERGE MR 2.25X12 (BALLOONS) ×1 IMPLANT
BALLOON ~~LOC~~ EMERGE MR 3.25X20 (BALLOONS) ×1 IMPLANT
CATH 5FR JL3.5 JR4 ANG PIG MP (CATHETERS) ×2 IMPLANT
CATH VISTA GUIDE 6FR XBLAD3.5 (CATHETERS) ×2 IMPLANT
DEVICE RAD COMP TR BAND LRG (VASCULAR PRODUCTS) ×2 IMPLANT
GLIDESHEATH SLEND SS 6F .021 (SHEATH) ×2 IMPLANT
GUIDEWIRE INQWIRE 1.5J.035X260 (WIRE) ×1 IMPLANT
INQWIRE 1.5J .035X260CM (WIRE) ×2
KIT ENCORE 26 ADVANTAGE (KITS) ×2 IMPLANT
KIT HEART LEFT (KITS) ×2 IMPLANT
PACK CARDIAC CATHETERIZATION (CUSTOM PROCEDURE TRAY) ×2 IMPLANT
STENT SIERRA 2.75 X 12 MM (Permanent Stent) ×2 IMPLANT
STENT SIERRA 3.00 X 28 MM (Permanent Stent) ×2 IMPLANT
TRANSDUCER W/STOPCOCK (MISCELLANEOUS) ×2 IMPLANT
TUBING CIL FLEX 10 FLL-RA (TUBING) ×2 IMPLANT
WIRE MARVEL STR TIP 190CM (WIRE) ×2 IMPLANT

## 2017-09-08 NOTE — Progress Notes (Signed)
#   5.  SHAYVION  @ [facility] # [phone]   BRILINTA  90 MG BID   COVER-YES  CO-PAY- $ 100.00  PRIOR APPROVAL- NO  NO DEDUCTIBLE  PREFERRED PHARMACY : [facility]

## 2017-09-08 NOTE — Interval H&P Note (Signed)
History and Physical Interval Note:  09/08/2017 10:45 AM  Carmen RegalPatricia J Nyquist  has presented today for cardiac catheterization, with the diagnosis of unstable angina  The various methods of treatment have been discussed with the patient and family. After consideration of risks, benefits and other options for treatment, the patient has consented to  Procedure(s): LEFT HEART CATH AND CORONARY ANGIOGRAPHY (N/A) as a surgical intervention .  The patient's history has been reviewed, patient examined, no change in status, stable for surgery.  I have reviewed the patient's chart and labs.  Questions were answered to the patient's satisfaction.    Cath Lab Visit (complete for each Cath Lab visit)  Clinical Evaluation Leading to the Procedure:   ACS: Yes.    Non-ACS:  N/A  Rosiland Sen

## 2017-09-08 NOTE — Care Management Note (Addendum)
Case Management Note  Patient Details  Name: Carmen Brown MRN: [id number] Date of Birth: 07-Sep-1940  Subjective/Objective:  From home, s/p Stent, will be on brilinta, co pay is estimated to be 100.00 , patient states this is too high for her she would like to be on something cheaper if possible. Per PA note, patient will be on brilinta for one month, she has the 30 day free coupon and then she will be on plavix.                    Action/Plan: NCM will follow for dc needs.   Expected Discharge Date:                  Expected Discharge Plan:  Home/Self Care  In-House Referral:     Discharge planning Services  CM Consult  Post Acute Care Choice:    Choice offered to:     DME Arranged:    DME Agency:     HH Arranged:    HH Agency:     Status of Service:  Completed, signed off  If discussed at [employer] Length of Stay Meetings, dates discussed:    Additional Comments:  Carmen Brown, Carmen Mayden Clinton, RN 09/08/2017, 4:04 PM

## 2017-09-08 NOTE — H&P (View-Only) (Signed)
Progress Note  Patient Name: Carmen Brown Date of Encounter: 09/08/2017  Primary Cardiologist: Lewayne Bunting, MD   Subjective   Feeling well this morning.  No recurrent chest pain.  Her symptoms improved after a GI cocktail.  She wonders if her symptoms may be GI related.  Inpatient Medications    Scheduled Meds: . aspirin EC  81 mg Oral Daily  . atorvastatin  80 mg Oral q1800  . lisinopril  20 mg Oral Daily  . metoprolol succinate  25 mg Oral Daily  . sodium chloride flush  3 mL Intravenous Q12H   Continuous Infusions: . sodium chloride    . sodium chloride 1 mL/kg/hr (09/08/17 0937)  . heparin 1,100 Units/hr (09/08/17 0639)   PRN Meds: sodium chloride, acetaminophen, nitroGLYCERIN, sodium chloride flush   Vital Signs    Vitals:   09/07/17 2130 09/07/17 2210 09/08/17 0634 09/08/17 0746  BP: 129/83 (!) 148/97 132/80 138/81  Pulse: 77 83 94 70  Resp: 19  16 17   Temp:  98.2 F (36.8 C) 98.9 F (37.2 C) 97.8 F (36.6 C)  TempSrc:  Oral Oral Oral  SpO2: 97% 98% 96% 98%  Weight:  189 lb 14.4 oz (86.1 kg) 189 lb 11.2 oz (86 kg)   Height:  5\' 6"  (1.676 m)      Intake/Output Summary (Last 24 hours) at 09/08/2017 1004 Last data filed at 09/08/2017 0800 Gross per 24 hour  Intake 280.46 ml  Output 100 ml  Net 180.46 ml   Filed Weights   09/07/17 1322 09/07/17 2210 09/08/17 0634  Weight: 185 lb (83.9 kg) 189 lb 14.4 oz (86.1 kg) 189 lb 11.2 oz (86 kg)    Telemetry    Sinus rhythm.  No events.- Personally Reviewed  ECG    Sinus rhythm.  Rate 83 bpm.  PVC.  Low voltage precordial leads- Personally Reviewed  Physical Exam   VS:  BP 138/81 (BP Location: Left Arm)   Pulse 70   Temp 97.8 F (36.6 C) (Oral)   Resp 17   Ht 5\' 6"  (1.676 m)   Wt 189 lb 11.2 oz (86 kg) Comment: b scale  SpO2 98%   BMI 30.62 kg/m  , BMI Body mass index is 30.62 kg/m. GENERAL:  Well appearing HEENT: Pupils equal round and reactive, fundi not visualized, oral mucosa  unremarkable NECK:  No jugular venous distention, waveform within normal limits, carotid upstroke brisk and symmetric, no bruits LUNGS:  Clear to auscultation bilaterally HEART:  RRR.  PMI not displaced or sustained,S1 and S2 within normal limits, no S3, no S4, no clicks, no rubs, no murmurs ABD:  Flat, positive bowel sounds normal in frequency in pitch, no bruits, no rebound, no guarding, no midline pulsatile mass, no hepatomegaly, no splenomegaly EXT:  2 plus pulses throughout, no edema, no cyanosis no clubbing SKIN:  No rashes no nodules NEURO:  Cranial nerves II through XII grossly intact, motor grossly intact throughout [facility]:  Cognitively intact, oriented to person place and time   Labs    Chemistry Recent Labs  Lab 09/07/17 1330 09/08/17 0259  NA 140 138  K 4.2 4.2  CL 107 107  CO2 23 19*  GLUCOSE 142* 188*  BUN 18 18  CREATININE 1.08* 0.91  CALCIUM 9.3 9.1  GFRNONAA 49* >60  GFRAA 57* >60  ANIONGAP 10 12     Hematology Recent Labs  Lab 09/07/17 1330 09/08/17 0259  WBC 5.8 10.0  RBC 4.49 4.14  HGB  14.7 13.6  HCT 43.1 39.4  MCV 96.0 95.2  MCH 32.7 32.9  MCHC 34.1 34.5  RDW 12.6 12.5  PLT 204 182    Cardiac Enzymes Recent Labs  Lab 09/07/17 2212 09/08/17 0259  TROPONINI <0.03 <0.03    Recent Labs  Lab 09/07/17 1338 09/07/17 1734  TROPIPOC 0.00 0.00     BNPNo results for input(s): BNP, PROBNP in the last 168 hours.   DDimer No results for input(s): DDIMER in the last 168 hours.   Radiology    Dg Chest 2 View  Result Date: 09/07/2017 CLINICAL DATA:  Chest pressure and shortness of breath. History of cardiomyopathy, cardiac dysrhythmia, and hypertension. EXAM: CHEST  2 VIEW COMPARISON:  PA and lateral chest x-ray of May 29, 2014 FINDINGS: The lungs are adequately inflated. There patchy increased density in the right infrahilar region. There is no pleural effusion. The heart and pulmonary vascularity are normal. The mediastinum is normal in  width. There calcification in the wall of the aortic arch. IMPRESSION: No evidence of CHF. Subtle increased density in the right infrahilar region may reflect infiltrate, atelectasis, or a pulmonary nodule. Given the patient's symptoms, pneumonia is felt most likely. Followup PA and lateral chest X-ray is recommended in 3-4 weeks following trial of antibiotic therapy to ensure resolution and exclude underlying malignancy. Thoracic aortic atherosclerosis. Electronically Signed   By: David  SwazilandJordan M.D.   On: 09/07/2017 15:06    Cardiac Studies   LHC Pending  Patient Profile     77 y.o. 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  Assessment & Plan    # Chest exertional chest pain: Carmen Brown had exertional chest pain and has been set up forpain.  She is set up for a left heart catheterization today.  She reports that her symptoms did improve with a GI cocktail and wonders if it could be due to GI.  Atorvastatin was started this admission and can be discontinued if her coronaries are normal.  # Hypertension: BP well-controlled.  Continue lisinopril and metoprolol.  For questions or updates, please contact [facility] Please consult [email or web address] for contact info under Cardiology/STEMI.      Signed, Chilton Siiffany [place], MD  09/08/2017, 10:04 AM

## 2017-09-08 NOTE — Progress Notes (Signed)
Progress Note  Patient Name: Carmen Brown Date of Encounter: 09/08/2017  Primary Cardiologist: Carmen Bunting, MD   Subjective   Feeling well this morning.  No recurrent chest pain.  Her symptoms improved after a GI cocktail.  She wonders if her symptoms may be GI related.  Inpatient Medications    Scheduled Meds: . aspirin EC  81 mg Oral Daily  . atorvastatin  80 mg Oral q1800  . lisinopril  20 mg Oral Daily  . metoprolol succinate  25 mg Oral Daily  . sodium chloride flush  3 mL Intravenous Q12H   Continuous Infusions: . sodium chloride    . sodium chloride 1 mL/kg/hr (09/08/17 0937)  . heparin 1,100 Units/hr (09/08/17 0639)   PRN Meds: sodium chloride, acetaminophen, nitroGLYCERIN, sodium chloride flush   Vital Signs    Vitals:   09/07/17 2130 09/07/17 2210 09/08/17 0634 09/08/17 0746  BP: 129/83 (!) 148/97 132/80 138/81  Pulse: 77 83 94 70  Resp: 19  16 17   Temp:  98.2 F (36.8 C) 98.9 F (37.2 C) 97.8 F (36.6 C)  TempSrc:  Oral Oral Oral  SpO2: 97% 98% 96% 98%  Weight:  189 lb 14.4 oz (86.1 kg) 189 lb 11.2 oz (86 kg)   Height:  5\' 6"  (1.676 m)      Intake/Output Summary (Last 24 hours) at 09/08/2017 1004 Last data filed at 09/08/2017 0800 Gross per 24 hour  Intake 280.46 ml  Output 100 ml  Net 180.46 ml   Filed Weights   09/07/17 1322 09/07/17 2210 09/08/17 0634  Weight: 185 lb (83.9 kg) 189 lb 14.4 oz (86.1 kg) 189 lb 11.2 oz (86 kg)    Telemetry    Sinus rhythm.  No events.- Personally Reviewed  ECG    Sinus rhythm.  Rate 83 bpm.  PVC.  Low voltage precordial leads- Personally Reviewed  Physical Exam   VS:  BP 138/81 (BP Location: Left Arm)   Pulse 70   Temp 97.8 F (36.6 C) (Oral)   Resp 17   Ht 5\' 6"  (1.676 m)   Wt 189 lb 11.2 oz (86 kg) Comment: b scale  SpO2 98%   BMI 30.62 kg/m  , BMI Body mass index is 30.62 kg/m. GENERAL:  Well appearing HEENT: Pupils equal round and reactive, fundi not visualized, oral mucosa  unremarkable NECK:  No jugular venous distention, waveform within normal limits, carotid upstroke brisk and symmetric, no bruits LUNGS:  Clear to auscultation bilaterally HEART:  RRR.  PMI not displaced or sustained,S1 and S2 within normal limits, no S3, no S4, no clicks, no rubs, no murmurs ABD:  Flat, positive bowel sounds normal in frequency in pitch, no bruits, no rebound, no guarding, no midline pulsatile mass, no hepatomegaly, no splenomegaly EXT:  2 plus pulses throughout, no edema, no cyanosis no clubbing SKIN:  No rashes no nodules NEURO:  Cranial nerves II through XII grossly intact, motor grossly intact throughout [facility]:  Cognitively intact, oriented to person place and time   Labs    Chemistry Recent Labs  Lab 09/07/17 1330 09/08/17 0259  NA 140 138  K 4.2 4.2  CL 107 107  CO2 23 19*  GLUCOSE 142* 188*  BUN 18 18  CREATININE 1.08* 0.91  CALCIUM 9.3 9.1  GFRNONAA 49* >60  GFRAA 57* >60  ANIONGAP 10 12     Hematology Recent Labs  Lab 09/07/17 1330 09/08/17 0259  WBC 5.8 10.0  RBC 4.49 4.14  HGB  14.7 13.6  HCT 43.1 39.4  MCV 96.0 95.2  MCH 32.7 32.9  MCHC 34.1 34.5  RDW 12.6 12.5  PLT 204 182    Cardiac Enzymes Recent Labs  Lab 09/07/17 2212 09/08/17 0259  TROPONINI <0.03 <0.03    Recent Labs  Lab 09/07/17 1338 09/07/17 1734  TROPIPOC 0.00 0.00     BNPNo results for input(s): BNP, PROBNP in the last 168 hours.   DDimer No results for input(s): DDIMER in the last 168 hours.   Radiology    Dg Chest 2 View  Result Date: 09/07/2017 CLINICAL DATA:  Chest pressure and shortness of breath. History of cardiomyopathy, cardiac dysrhythmia, and hypertension. EXAM: CHEST  2 VIEW COMPARISON:  PA and lateral chest x-ray of May 29, 2014 FINDINGS: The lungs are adequately inflated. There patchy increased density in the right infrahilar region. There is no pleural effusion. The heart and pulmonary vascularity are normal. The mediastinum is normal in  width. There calcification in the wall of the aortic arch. IMPRESSION: No evidence of CHF. Subtle increased density in the right infrahilar region may reflect infiltrate, atelectasis, or a pulmonary nodule. Given the patient's symptoms, pneumonia is felt most likely. Followup PA and lateral chest X-ray is recommended in 3-4 weeks following trial of antibiotic therapy to ensure resolution and exclude underlying malignancy. Thoracic aortic atherosclerosis. Electronically Signed   By: David  [place]azil[place] M.D.   On: 09/07/2017 15:06    Cardiac Studies   LHC Pending  Patient Profile     77 y.o. 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  Assessment & Plan    # Chest exertional chest pain: [name] had exertional chest pain and has been set up forpain.  She is set up for a left heart catheterization today.  She reports that her symptoms did improve with a GI cocktail and wonders if it could be due to GI.  Atorvastatin was started this admission and can be discontinued if her coronaries are normal.  # Hypertension: BP well-controlled.  Continue lisinopril and metoprolol.  For questions or updates, please contact [facility] Please consult [email or web address] for contact info under Cardiology/STEMI.      Signed, Carmen Siiffany [place], MD  09/08/2017, 10:04 AM

## 2017-09-08 NOTE — Progress Notes (Signed)
ANTICOAGULATION CONSULT NOTE  Pharmacy Consult for Heparin Indication: chest pain/ACS  Allergies  Allergen Reactions  . Epinephrine Other (See Comments)    unknown  . Latex     rash    Patient Measurements: Height: 5\' 6"  (167.6 cm) Weight: 189 lb 14.4 oz (86.1 kg)(b scale) IBW/kg (Calculated) : 59.3 Heparin Dosing Weight: 77.1kg  Vital Signs: Temp: 98.2 F (36.8 C) (01/24 2210) Temp Source: Oral (01/24 2210) BP: 148/97 (01/24 2210) Pulse Rate: 83 (01/24 2210)  Labs: Recent Labs    09/07/17 1330 09/07/17 2212 09/08/17 0259  HGB 14.7  --  13.6  HCT 43.1  --  39.4  PLT 204  --  182  LABPROT  --   --  14.8  INR  --   --  1.17  HEPARINUNFRC  --   --  0.14*  CREATININE 1.08*  --   --   TROPONINI  --  <0.03  --     Estimated Creatinine Clearance: 49.7 mL/min (A) (by C-G formula based on SCr of 1.08 mg/dL (H)).   Medical History: Past Medical History:  Diagnosis Date  . Allergy   . Arthritis   . Cardiomegaly   . Cardiomyopathy (HCC)    EF 35% in the distant past but NL EF 2009  . Hypertension, benign    systemic  . Menopausal syndrome   . Obesity   . Osteoarthritis of lower leg, localized   . Paroxysmal VT (HCC)   . Thyroid disease     Medications:  Not on any anticoagulant PTA  Assessment: Carmen Brown presented to ED for chest pain. Pharmacy consulted for heparin r/o ACS. Negative trop x2. Scr and CBC WNL. No bleeding noted.  Initial heparin level is SUBtherapeutic. CBC stable.   Goal of Therapy:  Heparin level 0.3-0.7 units/ml Monitor platelets by anticoagulation protocol: Yes    Plan:  -Increase heparin to 1100 units/hr -Daily HL, CBC -Check in 8 hours   Nikie Cid, Darl HouseholderAlison M 09/08/2017,3:38 AM

## 2017-09-08 NOTE — Brief Op Note (Signed)
Brief Cardiac Catheterization Note  DATE: 09/08/2017 TIME: 12:38 PM  PATIENT:  Earnstine RegalPatricia J Stansell  77 y.o. female  PRE-OPERATIVE DIAGNOSIS:  Unstable angina  POST-OPERATIVE DIAGNOSIS:  Unstable angina  PROCEDURE:  Procedure(s): LEFT HEART CATH AND CORONARY ANGIOGRAPHY (N/A)  SURGEON:  Surgeon(s) and Role:    * Yvonne KendallEnd, Armanii Pressnell, MD - Primary  FINDINGS: 1. Severe single vessel coronary artery disease with seqeuential 95% and 50% proximal LAD stenoses. 2. Mild to moderate ramus and LCx disease. 3. Normal left ventricular contraction. 4. Mildly elevated left ventricular filling pressure. 5. Successful PCI to proximal/mid LAD with placement of overlapping Xience Sierra 3.0 x 28 mm and 2.75 x 12 mm drug-eluting stents with 0% residual stenosis and TIMI-3 flow.  RECOMMENDATIONS: 1. Dual antiplatelet therapy with aspirin and ticagrelor for at least 12 months. 2. Aggressive secondary prevention.  Yvonne Kendallhristopher Jadae Steinke, MD [facility] Pager: [phone]

## 2017-09-08 NOTE — Progress Notes (Signed)
Pt cannot afford Brilinta, please address.

## 2017-09-09 DIAGNOSIS — I2510 Atherosclerotic heart disease of native coronary artery without angina pectoris: Secondary | ICD-10-CM

## 2017-09-09 LAB — CBC
HEMATOCRIT: 40.8 % (ref 36.0–46.0)
Hemoglobin: 13.7 g/dL (ref 12.0–15.0)
MCH: 32.4 pg (ref 26.0–34.0)
MCHC: 33.6 g/dL (ref 30.0–36.0)
MCV: 96.5 fL (ref 78.0–100.0)
PLATELETS: 186 10*3/uL (ref 150–400)
RBC: 4.23 MIL/uL (ref 3.87–5.11)
RDW: 12.7 % (ref 11.5–15.5)
WBC: 9.3 10*3/uL (ref 4.0–10.5)

## 2017-09-09 LAB — BASIC METABOLIC PANEL
Anion gap: 9 (ref 5–15)
BUN: 14 mg/dL (ref 6–20)
CO2: 23 mmol/L (ref 22–32)
CREATININE: 0.8 mg/dL (ref 0.44–1.00)
Calcium: 8.9 mg/dL (ref 8.9–10.3)
Chloride: 106 mmol/L (ref 101–111)
GFR calc Af Amer: >60 mL/min (ref >60)
GLUCOSE: 99 mg/dL (ref 65–99)
POTASSIUM: 4.4 mmol/L (ref 3.5–5.1)
SODIUM: 138 mmol/L (ref 135–145)

## 2017-09-09 MED ORDER — NITROGLYCERIN 0.4 MG SL SUBL: 0.4000 mg | SUBLINGUAL_TABLET | SUBLINGUAL | 2 refills | Status: AC | PRN

## 2017-09-09 MED ORDER — ASPIRIN 81 MG PO TBEC: 81.0000 mg | DELAYED_RELEASE_TABLET | Freq: Every day | ORAL | Status: DC

## 2017-09-09 MED ORDER — TICAGRELOR 90 MG PO TABS: 90.0000 mg | ORAL_TABLET | Freq: Two times a day (BID) | ORAL | 0 refills | Status: DC

## 2017-09-09 MED ORDER — ATORVASTATIN CALCIUM 80 MG PO TABS: 80.0000 mg | ORAL_TABLET | Freq: Every day | ORAL | 11 refills | Status: DC

## 2017-09-09 NOTE — Discharge Summary (Signed)
Discharge Summary    Patient ID: ELESE RANE,  MRN: [id number], DOB/AGE: [birth year] 75 y.o.  Admit date: 09/07/2017 Discharge date: 09/09/2017  Primary Care Provider: Patient, No Pcp Per Primary Cardiologist: Previously Dr. [name] in 2015; Evaluated by Dr. Antoine Poche on Admission  Discharge Diagnoses    Principal Problem:   Unstable angina [facility]) Active Problems:   HYPERTENSION, BENIGN SYSTEMIC   CAD (coronary artery disease)   History of Present Illness     DELAINA FETSCH is a 77 y.o. female with past medical history of monomorphic VT (previously reduced EF of 30-35%, improved to 55-60% in 2009), and HTN who presented to [facility] ED on 09/07/2017 for evaluation of chest pain.   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 She continued to experience symptoms with associated dyspnea, therefore she came to the ED for further evaluation.   Her initial cardiac enzymes were negative and EKG showed no acute ischemic changes but with her presentation, it was recommended she undergo a cardiac catheterization for definitive evaluation.   Hospital Course     Consultants: None  Her cardiac catheterization was performed on 09/08/2017 and showed sequential 95% and 50% proximal LAD stenoses with mild to moderate, nonobstructive coronary artery disease involving the ramus intermedius, LCx, and RCA. She underwent successful PCI to the proximal/mid LAD with placement of overlapping Xience Sierra 3.0 x 28 mm (proximal) and 2.75 x 12 mm (distal) drug-eluting stents. She will be on DAPT for at least 12 months along with statin and BB therapy.   The following morning, she denied any recurrent chest pain or dyspnea. Right radial cath site was stable without ecchymosis or evidence of a hematoma. Her last troponin was found to be minimally elevated at 0.07 but this was not felt to represent ACS. Kidney function and Hgb remained  stable following her procedure.   She was last examined by Dr. Jens Som and deemed stable for discharge. She will be on ASA and Brilinta for at least 1 month then will switch to ASA and Plavix as her co-pay for Brilinta was unaffordable. She will need a script for Plavix at that the time of her follow-up appointment. Outpatient Cardiology follow-up has been arranged. She was discharged home in good condition.   _____________  Discharge Physical Exam and Vitals Blood pressure (!) 159/76, pulse 76, temperature 97.9 F (36.6 C), temperature source Oral, resp. rate 15, height 5\' 6"  (1.676 m), weight 195 lb 1.7 oz (88.5 kg), SpO2 97 %.  Filed Weights   09/07/17 2210 09/08/17 0634 09/09/17 0507  Weight: 189 lb 14.4 oz (86.1 kg) 189 lb 11.2 oz (86 kg) 195 lb 1.7 oz (88.5 kg)    General: Pleasant Caucasian female appearing in NAD Psych: Normal affect. Neuro: Alert and oriented X 3. Moves all extremities spontaneously. HEENT: Normal  Neck: Supple without bruits or JVD. Lungs:  Resp regular and unlabored, CTA. Heart: RRR no s3, s4, or murmurs. Abdomen: Soft, non-tender, non-distended, BS + x 4.  Extremities: No clubbing, cyanosis or edema. DP/PT/Radials 2+ and equal bilaterally. Radial site stable without evidence of a hematoma.   Labs & Radiologic Studies     CBC Recent Labs    09/08/17 0259 09/09/17 0602  WBC 10.0 9.3  HGB 13.6 13.7  HCT 39.4 40.8  MCV 95.2 96.5  PLT 182 186   Basic Metabolic Panel Recent Labs    40/98/11 0259 09/09/17  0602  NA 138 138  K 4.2 4.4  CL 107 106  CO2 19* 23  GLUCOSE 188* 99  BUN 18 14  CREATININE 0.91 0.80  CALCIUM 9.1 8.9   Liver Function Tests No results for input(s): AST, ALT, ALKPHOS, BILITOT, PROT, ALBUMIN in the last 72 hours. No results for input(s): LIPASE, AMYLASE in the last 72 hours. Cardiac Enzymes Recent Labs    09/07/17 2212 09/08/17 0259 09/08/17 1415  TROPONINI <0.03 <0.03 0.07*   BNP Invalid input(s):  POCBNP D-Dimer No results for input(s): DDIMER in the last 72 hours. Hemoglobin A1C No results for input(s): HGBA1C in the last 72 hours. Fasting Lipid Panel Recent Labs    09/08/17 0259  CHOL 154  HDL 39*  LDLCALC 95  TRIG 409  CHOLHDL 3.9   Thyroid Function Tests No results for input(s): TSH, T4TOTAL, T3FREE, THYROIDAB in the last 72 hours.  Invalid input(s): FREET3  Dg Chest 2 View  Result Date: 09/07/2017 CLINICAL DATA:  Chest pressure and shortness of breath. History of cardiomyopathy, cardiac dysrhythmia, and hypertension. EXAM: CHEST  2 VIEW COMPARISON:  PA and lateral chest x-ray of May 29, 2014 FINDINGS: The lungs are adequately inflated. There patchy increased density in the right infrahilar region. There is no pleural effusion. The heart and pulmonary vascularity are normal. The mediastinum is normal in width. There calcification in the wall of the aortic arch. IMPRESSION: No evidence of CHF. Subtle increased density in the right infrahilar region may reflect infiltrate, atelectasis, or a pulmonary nodule. Given the patient's symptoms, pneumonia is felt most likely. Followup PA and lateral chest X-ray is recommended in 3-4 weeks following trial of antibiotic therapy to ensure resolution and exclude underlying malignancy. Thoracic aortic atherosclerosis. Electronically Signed   By: David  [place] M.D.   On: 09/07/2017 15:06     Diagnostic Studies/Procedures     Cardiac Catheterization: 09/08/2017 Conclusions: 1. Severe single-vessel coronary artery disease with sequential 95% and 50% proximal LAD stenoses. 2. Mild to moderate, nonobstructive coronary artery disease involving the ramus intermedius, LCx, and RCA. 3. Mildly elevated left ventricular filling pressure with normal to hyperdynamic left ventricular contraction. 4. Successful PCI to the proximal/mid LAD with placement of overlapping Xience Sierra 3.0 x 28 mm (proximal) and 2.75 x 12 mm (distal) drug-eluting  stents.  Distal stent was placed due to concern for distal edge dissection after initial stent deployment.  Final angiogram demonstrates 0% residual stenosis with TIMI-3 flow.  Recommendations: 1. Dual antiplatelet therapy with aspirin and ticagrelor for at least 12 months. 2. Aggressive secondary prevention, including high intensity statin therapy. 3. Likely discharge tomorrow with outpatient cardiac rehab.    Disposition   Pt is being discharged home today in good condition.  Follow-up Plans & Appointments    Follow-up Information    Rollene Rotunda, MD Follow up on 09/26/2017.   Specialty:  Cardiology Why:  Cardiology Follow-Up on 09/26/2017 at 9:00AM.  Contact information: [address] [place] [address] [phone]          Discharge Instructions    Diet - low sodium heart healthy   Complete by:  As directed    Discharge instructions   Complete by:  As directed    PLEASE REMEMBER TO BRING ALL OF YOUR MEDICATIONS TO EACH OF YOUR FOLLOW-UP OFFICE VISITS.  PLEASE ATTEND ALL SCHEDULED FOLLOW-UP APPOINTMENTS.   Activity: Increase activity slowly as tolerated. You may shower, but no soaking baths (or swimming) for 1 week. No driving  for 24 hours. No lifting over 5 lbs for 1 week. No sexual activity for 1 week.   You May Return to Work: in 1 week (if applicable)  Wound Care: You may wash cath site gently with soap and water. Keep cath site clean and dry. If you notice pain, swelling, bleeding or pus at your cath site, please call [phone].   Increase activity slowly   Complete by:  As directed       Discharge Medications     Medication List    TAKE these medications   aspirin 81 MG EC tablet Take 1 tablet (81 mg total) by mouth daily.   atorvastatin 80 MG tablet Commonly known as:  LIPITOR Take 1 tablet (80 mg total) by mouth daily at 6 PM.   CO Q 10 PO Take 1 capsule by mouth daily.   FISH OIL PO Take 1 capsule by mouth daily.    fluticasone 50 MCG/ACT nasal spray Commonly known as:  FLONASE PLACE 2 SPRAYS INTO THE NOSE DAILY. What changed:    how much to take  how to take this  when to take this  reasons to take this  additional instructions   Glucosamine Sulfate 750 MG Caps Take 2 capsules by mouth daily.   lisinopril 20 MG tablet Commonly known as:  PRINIVIL,ZESTRIL Take 1 tablet (20 mg total) by mouth daily.   metoprolol succinate 25 MG 24 hr tablet Commonly known as:  TOPROL-XL Take 1 tablet (25 mg total) by mouth daily.   multivitamin capsule Take 2 capsules by mouth 2 (two) times daily.   nitroGLYCERIN 0.4 MG SL tablet Commonly known as:  NITROSTAT Place 1 tablet (0.4 mg total) under the tongue every 5 (five) minutes x 3 doses as needed for chest pain.   OVER THE COUNTER MEDICATION Take 1 tablet by mouth daily. VITAMIN B6   ticagrelor 90 MG Tabs tablet Commonly known as:  BRILINTA Take 1 tablet (90 mg total) by mouth 2 (two) times daily.       Aspirin prescribed at discharge?  Yes High Intensity Statin Prescribed? (Lipitor 40-80mg  or Crestor 20-40mg ): Yes Beta Blocker Prescribed? Yes For EF 40% or less, Was ACEI/ARB Prescribed? Yes ADP Receptor Inhibitor Prescribed? (i.e. Plavix etc.-Includes Medically Managed Patients): Yes For EF <40%, Aldosterone Inhibitor Prescribed? No: EF > 40% Was EF assessed during THIS hospitalization? Yes Was Cardiac Rehab II ordered? (Included Medically managed Patients): Yes   Allergies Allergies  Allergen Reactions  . Epinephrine Other (See Comments)    unknown  . Latex     rash     Outstanding Labs/Studies   FLP and LFT's in 6-8 weeks.   Duration of Discharge Encounter   Greater than 30 minutes including physician time.  Signed, Ellsworth LennoxBrittany M Elhadji Pecore, PA-C 09/09/2017, 9:19 AM

## 2017-09-09 NOTE — Progress Notes (Addendum)
CARDIAC REHAB PHASE I   Pt ambulating in hallway independently and with staff.  Ambulation deferred.  Education completed including risk factor modification, low fat-low cholesterol diet, exercise, and medication compliance. Pt has used Next 56 diet in past with effective results. Pt notes this includes high amounts of coconut oil which she will eliminate.  Pt works as Financial controllerflight attendant.  She plans to stay out of work for 2 weeks until seen by cardiology. Pt concerned about Brilinta cost. Pt instructed to use 30 day coupon and discuss concerns with cardiologist.  Perhaps samples or coupon cards are available at office.  Pt educated on importance of  taking brilinta as prescribed.            Pt oriented to outpatient cardiac rehab.  At pt request[facility]   Pt is flight attendant.  She is interested in participating however unsure if her work schedule will allow.  Pt also given information about [facility] [facility] [employer] [facility].  Understanding verbalized  [phone]  Carmen MustyJoann Hamilton Dessiree Brown  09/09/17  10:01 AM

## 2017-09-10 ENCOUNTER — Encounter (HOSPITAL_COMMUNITY): Payer: Self-pay | Admitting: Internal Medicine

## 2017-09-11 MED FILL — Heparin Sodium (Porcine) 2 Unit/ML in Sodium Chloride 0.9%: INTRAMUSCULAR | Qty: 1000 | Status: AC

## 2017-09-11 MED FILL — Lidocaine HCl Local Inj 1%: INTRAMUSCULAR | Qty: 20 | Status: AC

## 2017-09-13 ENCOUNTER — Telehealth (HOSPITAL_COMMUNITY): Payer: Self-pay

## 2017-09-13 NOTE — Telephone Encounter (Signed)
Patients insurance is active and benefits verified through [facility] - No co-pay, deductible amount of $850.00/$261.85 has been met, out of pocket amount of $2,000/$19.91 has been met, 20% co-insurance, and no pre-authorization is required. Spoke with [employer].  Patient will be contacted and scheduled after review by the RN Navigator.

## 2017-09-15 ENCOUNTER — Telehealth (HOSPITAL_COMMUNITY): Payer: Self-pay

## 2017-09-15 NOTE — Telephone Encounter (Signed)
Attempted to call patient in regards to Cardiac Rehab - lm on vm °

## 2017-09-20 ENCOUNTER — Encounter: Payer: Self-pay | Admitting: Cardiology

## 2017-09-25 ENCOUNTER — Encounter: Payer: Self-pay | Admitting: Cardiology

## 2017-09-25 ENCOUNTER — Telehealth (HOSPITAL_COMMUNITY): Payer: Self-pay

## 2017-09-25 NOTE — Telephone Encounter (Signed)
Called to follow up with patient in regards to Cardiac rehab - patient stated she see's her dr tomorrow and will call us back.

## 2017-09-25 NOTE — Progress Notes (Signed)
Cardiology Office Note   Date:  09/26/2017   ID:  Harlem, Thresher Jul 20, 1941, MRN [id number]  PCP:  Patient, No Pcp Per  Cardiologist:   Lewayne Bunting, MD   Chief Complaint  Patient presents with  . Shortness of Breath      History of Present Illness: Carmen Brown is a 77 y.o. female who presents for follow up of CAD.  He was admitted with [place].  He was found to have 95% LAD stenosis.  He was treated with DAPT.   EF was normal.      She felt okay when she left the hospital.  Unfortunately now she does not feel as well.  She feels a little more winded.  She feels lightheaded.  She does not have the energy she used to have.  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  She thinks some of this is related to the medicines that were started including the Lipitor and Brilinta.  She has not had the chest discomfort that she had previously.  She has not had any neck or arm discomfort.   Past Medical History:  Diagnosis Date  . Allergy   . Arthritis   . CAD (coronary artery disease)    95% proximal LAD stenosis with DES.  Jan 2019  . Cardiomegaly   . Cardiomyopathy (HCC)    EF 35% in the distant past but NL EF 2009  . Hypertension, benign    systemic  . Menopausal syndrome   . Obesity   . Osteoarthritis of lower leg, localized   . Paroxysmal VT (HCC)   . Thyroid disease     Past Surgical History:  Procedure Laterality Date  . CARPAL TUNNEL RELEASE Right 11/2016  . LEFT HEART CATH AND CORONARY ANGIOGRAPHY N/A 09/08/2017   Procedure: LEFT HEART CATH AND CORONARY ANGIOGRAPHY;  Surgeon: Yvonne Kendall, MD;  [facility];  Service: Cardiovascular;  Laterality: N/A;  . MASS EXCISION  03/15/2012   Procedure: MINOR EXCISION OF MASS;  Surgeon: Wyn Forster., MD;  Location:  SURGERY CENTER;  Service: Orthopedics;  Laterality: Right;  debride IP right long finger, excision mucoid cyst  .  ventricular tacticartia       Current Outpatient Medications  Medication Sig Dispense Refill  . aspirin EC 81 MG EC tablet Take 1 tablet (81 mg total) by mouth daily.    Marland Kitchen atorvastatin (LIPITOR) 40 MG tablet Take 1 tablet (40 mg total) by mouth daily at 6 PM. 90 tablet 3  . Coenzyme Q10 (CO Q 10 PO) Take 1 capsule by mouth daily.    . fluticasone (FLONASE) 50 MCG/ACT nasal spray PLACE 2 SPRAYS INTO THE NOSE DAILY. (Patient taking differently: Place 2 sprays into both nostrils daily as needed for allergies or rhinitis. ) 16 g 6  . Glucosamine Sulfate 750 MG CAPS Take 2 capsules by mouth daily.    Marland Kitchen lisinopril (PRINIVIL,ZESTRIL) 20 MG tablet Take 1 tablet (20 mg total) by mouth daily. 90 tablet 3  . metoprolol succinate (TOPROL-XL) 25 MG 24 hr tablet Take 1 tablet (25 mg total) by mouth daily. 90 tablet 3  . Multiple Vitamin (MULTIVITAMIN) capsule Take 2 capsules by mouth 2 (two) times daily.     . nitroGLYCERIN (NITROSTAT) 0.4 MG SL tablet Place 1 tablet (0.4 mg total) under the tongue every 5 (five) minutes x 3  doses as needed for chest pain. 25 tablet 2  . Omega-3 Fatty Acids (FISH OIL PO) Take 1 capsule by mouth daily.    Marland Kitchen OVER THE COUNTER MEDICATION Take 1 tablet by mouth daily. VITAMIN B6    . clopidogrel (PLAVIX) 75 MG tablet Take 1 tablet (75 mg total) by mouth daily. 90 tablet 3   No current facility-administered medications for this visit.     Allergies:   Epinephrine and Latex    ROS:  Please see the history of present illness.   Otherwise, review of systems are positive for none.   All other systems are reviewed and negative.    PHYSICAL EXAM: VS:  BP 126/64 (BP Location: Left Arm, Patient Position: Sitting, Cuff Size: Normal)   Pulse 94   Ht 5\' 5"  (1.651 m)   Wt 189 lb 12.8 oz (86.1 kg)   BMI 31.58 kg/m  , BMI Body mass index is 31.58 kg/m. GENERAL:  Well appearing NECK:  No jugular venous distention, waveform within normal limits, carotid upstroke brisk and  symmetric, no bruits, no thyromegaly LUNGS:  Clear to auscultation bilaterally BACK:  No CVA tenderness CHEST:  Unremarkable HEART:  PMI not displaced or sustained,S1 and S2 within normal limits, no S3, no S4, no clicks, no rubs, no murmurs ABD:  Flat, positive bowel sounds normal in frequency in pitch, no bruits, no rebound, no guarding, no midline pulsatile mass, no hepatomegaly, no splenomegaly EXT:  2 plus pulses throughout, no edema, no cyanosis no clubbing   EKG:  EKG is ordered today. The ekg ordered today demonstrates sinus rhythm, rate 83, axis within normal limits, intervals within normal limits, no acute ST-T wave changes.  09/26/2017   Recent Labs: 01/16/2017: ALT 39 09/09/2017: BUN 14; Creatinine, Ser 0.80; Hemoglobin 13.7; Platelets 186; Potassium 4.4; Sodium 138    Lipid Panel    Component Value Date/Time   CHOL 154 09/08/2017 0259   TRIG 102 09/08/2017 0259   HDL 39 (L) 09/08/2017 0259   CHOLHDL 3.9 09/08/2017 0259   VLDL 20 09/08/2017 0259   LDLCALC 95 09/08/2017 0259      Wt Readings from Last 3 Encounters:  09/26/17 189 lb 12.8 oz (86.1 kg)  09/09/17 195 lb 1.7 oz (88.5 kg)  06/07/17 195 lb 9.6 oz (88.7 kg)      Other studies Reviewed: Additional studies/ records that were reviewed today include:  Hospital records. Review of the above records demonstrates:  Please see elsewhere in the note.     ASSESSMENT AND PLAN:    CAD:  The patient has no new sypmtoms.  She may have some breathlessness related to the Brilinta so I am going to switch her to Plavix.  VT:  There has been no recurrence of this.    HTN:  The blood pressure is at target. No change in medications is indicated. We will continue with therapeutic lifestyle changes (TLC).  DYSLIPIDEMIA: She agrees to try to continue taking a statin but once a lower dose and I will reduce this daily and when she comes back she can have a fasting lipid profile.   Current medicines are reviewed at length  with the patient today.  The patient does not have concerns regarding medicines.  The following changes have been made:  As above  Labs/ tests ordered today include: None No orders of the defined types were placed in this encounter.    Disposition:   FU with me in two months.  Signed, Rollene RotundaJames Caiya Bettes, MD  09/26/2017 1:03 PM    [facility]

## 2017-09-26 ENCOUNTER — Encounter: Payer: Self-pay | Admitting: Cardiology

## 2017-09-26 ENCOUNTER — Ambulatory Visit (INDEPENDENT_AMBULATORY_CARE_PROVIDER_SITE_OTHER): Payer: BLUE CROSS/BLUE SHIELD | Admitting: Cardiology

## 2017-09-26 VITALS — BP 126/64 | HR 94 | Ht 65.0 in | Wt 189.8 lb

## 2017-09-26 DIAGNOSIS — E785 Hyperlipidemia, unspecified: Secondary | ICD-10-CM | POA: Diagnosis not present

## 2017-09-26 DIAGNOSIS — I10 Essential (primary) hypertension: Secondary | ICD-10-CM | POA: Diagnosis not present

## 2017-09-26 DIAGNOSIS — I25110 Atherosclerotic heart disease of native coronary artery with unstable angina pectoris: Secondary | ICD-10-CM

## 2017-09-26 MED ORDER — CLOPIDOGREL BISULFATE 75 MG PO TABS: 75.0000 mg | ORAL_TABLET | Freq: Every day | ORAL | 3 refills | Status: DC

## 2017-09-26 MED ORDER — ATORVASTATIN CALCIUM 40 MG PO TABS: 40.0000 mg | ORAL_TABLET | Freq: Every day | ORAL | 3 refills | Status: DC

## 2017-09-26 NOTE — Patient Instructions (Signed)
Medication Instructions:  DECREASE- Atorvastatin(Lipitor) 40 mg daily STOP- Brilinta START- Plavix 75 mg daily  If you need a refill on your cardiac medications before your next appointment, please call your pharmacy.  Labwork: None Ordered  Testing/Procedures: None Ordered  Follow-Up: Your physician wants you to follow-up in: 2 Months.     Thank you for choosing [facility]!!

## 2017-09-28 ENCOUNTER — Telehealth: Payer: Self-pay | Admitting: Cardiology

## 2017-09-28 NOTE — Telephone Encounter (Signed)
[id number] 

## 2017-09-28 NOTE — Telephone Encounter (Signed)
New message  Pt verbalized that she is calling for the RN  She want rn to go over her medications

## 2017-09-28 NOTE — Telephone Encounter (Signed)
Spoke with pt, questions regarding plavix answered.

## 2017-09-29 NOTE — Addendum Note (Signed)
Addended by: Barrie DunkerHOMAS, NYASHA N on: 09/29/2017 03:33 PM   Modules accepted: Orders

## 2017-10-11 ENCOUNTER — Telehealth (HOSPITAL_COMMUNITY): Payer: Self-pay

## 2017-10-11 NOTE — Telephone Encounter (Signed)
Attempted to call patient in regards to Cardiac Rehab - lm on vm °

## 2017-10-11 NOTE — Telephone Encounter (Signed)
Patient returned phone call and stated she is not interested in the program as she has returned back to work full time. Closed referral.

## 2017-10-26 ENCOUNTER — Telehealth: Payer: Self-pay | Admitting: Cardiology

## 2017-10-26 NOTE — Telephone Encounter (Signed)
Closed Encounter  °

## 2017-11-08 ENCOUNTER — Other Ambulatory Visit: Payer: Self-pay | Admitting: Family Medicine

## 2017-11-08 DIAGNOSIS — J309 Allergic rhinitis, unspecified: Secondary | ICD-10-CM

## 2017-11-14 ENCOUNTER — Encounter: Payer: Self-pay | Admitting: Physician Assistant

## 2017-11-16 ENCOUNTER — Ambulatory Visit: Payer: BLUE CROSS/BLUE SHIELD | Admitting: Cardiology

## 2017-11-17 ENCOUNTER — Ambulatory Visit: Payer: BLUE CROSS/BLUE SHIELD | Admitting: Cardiology

## 2017-11-19 NOTE — Progress Notes (Signed)
Cardiology Office Note   Date:  11/20/2017   ID:  Carmen Maduroatricia J Krack, DOB Oct 04, 1940, MRN [id number]  PCP:  Patient, No Pcp Per  Cardiologist:   Lewayne BuntingGregg Taylor, MD   Chief Complaint  Patient presents with  . Coronary Artery Disease      History of Present Illness: Carmen Brown is a 77 y.o. female who presents for follow up of CAD.  She was admitted [place] in Jan.  She was found to have 95% LAD stenosis treated with DAPT.   EF was normal.    She had shortness of breath related to Brilinta and she was switched to Plavix.  Since then she has done well.  The patient denies any new symptoms such as chest discomfort, neck or arm discomfort. There has been no new shortness of breath, PND or orthopnea. There have been no reported palpitations, presyncope or syncope.  She is not exercising but she is working full time and [place].    Past Medical History:  Diagnosis Date  . Allergy   . Arthritis   . CAD (coronary artery disease)    95% proximal LAD stenosis with DES.  Jan 2019  . Cardiomegaly   . Cardiomyopathy (HCC)    EF 35% in the distant past but NL EF 2009  . Hypertension, benign    systemic  . Menopausal syndrome   . Obesity   . Osteoarthritis of lower leg, localized   . Paroxysmal VT (HCC)   . Thyroid disease     Past Surgical History:  Procedure Laterality Date  . CARPAL TUNNEL RELEASE Right 11/2016  . LEFT HEART CATH AND CORONARY ANGIOGRAPHY N/A 09/08/2017   Procedure: LEFT HEART CATH AND CORONARY ANGIOGRAPHY;  Surgeon: Yvonne KendallEnd, Christopher, MD;  Location: [facility];  Service: Cardiovascular;  Laterality: N/A;  . MASS EXCISION  03/15/2012   Procedure: MINOR EXCISION OF MASS;  Surgeon: Wyn Forsterobert V Sypher Jr., MD;  Location: [facility];  Service: Orthopedics;  Laterality: Right;  debride IP right long finger, excision mucoid cyst  . ventricular tacticartia       Current Outpatient Medications  Medication Sig Dispense  Refill  . aspirin EC 81 MG EC tablet Take 1 tablet (81 mg total) by mouth daily.    Marland Kitchen. atorvastatin (LIPITOR) 40 MG tablet Take 1 tablet (40 mg total) by mouth daily at 6 PM. 90 tablet 3  . clopidogrel (PLAVIX) 75 MG tablet Take 1 tablet (75 mg total) by mouth daily. 90 tablet 3  . Coenzyme Q10 (CO Q 10 PO) Take 1 capsule by mouth daily.    . fluticasone (FLONASE) 50 MCG/ACT nasal spray INHALE 2 SPRAYS INTO THE NOSE DAILY. 16 g 0  . Glucosamine Sulfate 750 MG CAPS Take 2 capsules by mouth daily.    Marland Kitchen. lisinopril (PRINIVIL,ZESTRIL) 20 MG tablet Take 1 tablet (20 mg total) by mouth daily. 90 tablet 3  . metoprolol succinate (TOPROL-XL) 25 MG 24 hr tablet Take 1 tablet (25 mg total) by mouth daily. 90 tablet 3  . Multiple Vitamin (MULTIVITAMIN) capsule Take 2 capsules by mouth 2 (two) times daily.     . nitroGLYCERIN (NITROSTAT) 0.4 MG SL tablet Place 1 tablet (0.4 mg total) under the tongue every 5 (five) minutes x 3 doses as needed for chest pain. 25 tablet 2  . Omega-3 Fatty Acids (FISH OIL PO) Take 1 capsule by mouth daily.    Marland Kitchen. OVER THE COUNTER MEDICATION  Take 1 tablet by mouth daily. VITAMIN B6     No current facility-administered medications for this visit.     Allergies:   Epinephrine and Latex    ROS:  Please see the history of present illness.   Otherwise, review of systems are positive for none.   All other systems are reviewed and negative.    PHYSICAL EXAM: VS:  BP (!) 149/92   Pulse 75   Ht 5' 5.5" (1.664 m)   Wt 184 lb 9.6 oz (83.7 kg)   BMI 30.25 kg/m  , BMI Body mass index is 30.25 kg/m.  GENERAL:  Well appearing NECK:  No jugular venous distention, waveform within normal limits, carotid upstroke brisk and symmetric, no bruits, no thyromegaly LUNGS:  Clear to auscultation bilaterally CHEST:  Unremarkable HEART:  PMI not displaced or sustained,S1 and S2 within normal limits, no S3, no S4, no clicks, no rubs, no murmurs ABD:  Flat, positive bowel sounds normal in  frequency in pitch, no bruits, no rebound, no guarding, no midline pulsatile mass, no hepatomegaly, no splenomegaly EXT:  2 plus pulses throughout, no edema, no cyanosis no clubbing   EKG:  EKG is not ordered today.   Recent Labs: 01/16/2017: ALT 39 09/09/2017: BUN 14; Creatinine, Ser 0.80; Hemoglobin 13.7; Platelets 186; Potassium 4.4; Sodium 138    Lipid Panel    Component Value Date/Time   CHOL 154 09/08/2017 0259   TRIG 102 09/08/2017 0259   HDL 39 (L) 09/08/2017 0259   CHOLHDL 3.9 09/08/2017 0259   VLDL 20 09/08/2017 0259   LDLCALC 95 09/08/2017 0259      Wt Readings from Last 3 Encounters:  11/20/17 184 lb 9.6 oz (83.7 kg)  09/26/17 189 lb 12.8 oz (86.1 kg)  09/09/17 195 lb 1.7 oz (88.5 kg)      Other studies Reviewed: Additional studies/ records that were reviewed today include:  Labs Review of the above records demonstrates:     ASSESSMENT AND PLAN:    CAD:  The patient has no new sypmtoms.  No further cardiovascular testing is indicated.  We will continue with aggressive risk reduction and meds as listed.  VT:  She has had no recurrence of this.   HTN:  The blood pressure is elevated but this is unusual.  She will keep a BP diary.   DYSLIPIDEMIA:  I will check a lipid profile.    Current medicines are reviewed at length with the patient today.  The patient does not have concerns regarding medicines.  The following changes have been made:  None  Labs/ tests ordered today include:   Orders Placed This Encounter  Procedures  . Lipid panel  . Hepatic function panel     Disposition:   FU with me in Jan.    Signed, Rollene Rotunda, MD  11/20/2017 11:02 AM    [facility]

## 2017-11-20 ENCOUNTER — Ambulatory Visit (INDEPENDENT_AMBULATORY_CARE_PROVIDER_SITE_OTHER): Payer: BLUE CROSS/BLUE SHIELD | Admitting: Cardiology

## 2017-11-20 ENCOUNTER — Encounter: Payer: Self-pay | Admitting: Cardiology

## 2017-11-20 VITALS — BP 149/92 | HR 75 | Ht 65.5 in | Wt 184.6 lb

## 2017-11-20 DIAGNOSIS — E785 Hyperlipidemia, unspecified: Secondary | ICD-10-CM | POA: Diagnosis not present

## 2017-11-20 DIAGNOSIS — Z79899 Other long term (current) drug therapy: Secondary | ICD-10-CM | POA: Diagnosis not present

## 2017-11-20 DIAGNOSIS — I2510 Atherosclerotic heart disease of native coronary artery without angina pectoris: Secondary | ICD-10-CM | POA: Diagnosis not present

## 2017-11-20 LAB — HEPATIC FUNCTION PANEL
ALK PHOS: 77 IU/L (ref 39–117)
ALT: 27 IU/L (ref 0–32)
AST: 28 IU/L (ref 0–40)
Albumin: 4.6 g/dL (ref 3.5–4.8)
BILIRUBIN, DIRECT: 0.19 mg/dL (ref 0.00–0.40)
Bilirubin Total: 0.7 mg/dL (ref 0.0–1.2)
TOTAL PROTEIN: 7.1 g/dL (ref 6.0–8.5)

## 2017-11-20 LAB — LIPID PANEL
CHOLESTEROL TOTAL: 105 mg/dL (ref 100–199)
Chol/HDL Ratio: 2.5 ratio (ref 0.0–4.4)
HDL: 42 mg/dL (ref >39)
LDL Calculated: 43 mg/dL (ref 0–99)
Triglycerides: 100 mg/dL (ref 0–149)
VLDL CHOLESTEROL CAL: 20 mg/dL (ref 5–40)

## 2017-11-20 NOTE — Patient Instructions (Signed)
Medication Instructions:  Continue current medications  If you need a refill on your cardiac medications before your next appointment, please call your pharmacy.  Labwork: Fasting Lipid Liver HERE IN OUR OFFICE AT [facility]  Take the provided lab slips for you to take with you to the lab for you blood draw.   You will need to fast. DO NOT EAT OR DRINK PAST MIDNIGHT.   Testing/Procedures: None Ordered  Follow-Up: Your physician wants you to follow-up in: 9 Months. You should receive a reminder letter in the mail two months in advance. If you do not receive a letter, please call our office [phone].     Thank you for choosing [facility]!!

## 2017-12-09 ENCOUNTER — Other Ambulatory Visit: Payer: Self-pay | Admitting: Family Medicine

## 2017-12-09 DIAGNOSIS — J309 Allergic rhinitis, unspecified: Secondary | ICD-10-CM

## 2017-12-24 ENCOUNTER — Other Ambulatory Visit: Payer: Self-pay | Admitting: Family Medicine

## 2017-12-24 DIAGNOSIS — J309 Allergic rhinitis, unspecified: Secondary | ICD-10-CM

## 2018-01-03 ENCOUNTER — Telehealth: Payer: Self-pay

## 2018-01-03 NOTE — Telephone Encounter (Signed)
Pt came in as a walk in to check her BP Cuff. Pt stated she got a low BP the other day and felt tired all day. Stated BP was 103 systolic.   BP when checked today was 120/84  When checked on her cuff was 130/90. Pt stated she also cannot remember when she changed the batteries last.  I told pt to try changing the batteries and then continue recording BP. If any changes or fluctuations, call. If any symptoms associated with high or low BP (fatigue, dizziness, headaches), please call.  Also explained to pt to call before coming in just to make sure someone is available.  Pt verbalized understanding and thanks.

## 2018-01-23 ENCOUNTER — Other Ambulatory Visit: Payer: Self-pay | Admitting: Family Medicine

## 2018-01-23 DIAGNOSIS — J309 Allergic rhinitis, unspecified: Secondary | ICD-10-CM

## 2018-01-23 NOTE — Telephone Encounter (Signed)
Flonase 50 mcg/act nasal spray  LOV 01/16/17 with Carmen Brown    Needs appt  [facility] [address] - [place], [place] - [id number]

## 2018-01-29 ENCOUNTER — Other Ambulatory Visit: Payer: Self-pay | Admitting: Cardiology

## 2018-01-29 DIAGNOSIS — I10 Essential (primary) hypertension: Secondary | ICD-10-CM

## 2018-01-29 MED ORDER — LISINOPRIL 20 MG PO TABS: 20.0000 mg | ORAL_TABLET | Freq: Every day | ORAL | 3 refills | Status: DC

## 2018-01-29 MED ORDER — METOPROLOL SUCCINATE ER 25 MG PO TB24: 25.0000 mg | ORAL_TABLET | Freq: Every day | ORAL | 3 refills | Status: DC

## 2018-01-29 NOTE — Telephone Encounter (Signed)
New Message:    Pt wants to know if Dr Antoine PocheHochrein would write her prescrtion or call it in for her blood pressure medicine? She need him to do this until she gets a primary doctor please. If he will, please call it to [facility] [address], [place].Pt says she is out,please let her know when you do this.

## 2018-01-29 NOTE — Telephone Encounter (Signed)
Returned call to patient to confirm which meds needed to be refilled. Rx(s) sent to pharmacy electronically - lisinopril, metoprolol succinate

## 2018-02-19 ENCOUNTER — Other Ambulatory Visit: Payer: Self-pay

## 2018-02-19 ENCOUNTER — Encounter: Payer: Self-pay | Admitting: Family Medicine

## 2018-02-19 ENCOUNTER — Ambulatory Visit: Payer: BLUE CROSS/BLUE SHIELD | Admitting: Family Medicine

## 2018-02-19 ENCOUNTER — Ambulatory Visit: Payer: Self-pay

## 2018-02-19 VITALS — BP 132/80 | HR 95 | Temp 98.9°F | Ht 65.28 in | Wt 177.8 lb

## 2018-02-19 DIAGNOSIS — H538 Other visual disturbances: Secondary | ICD-10-CM

## 2018-02-19 DIAGNOSIS — S060X0A Concussion without loss of consciousness, initial encounter: Secondary | ICD-10-CM

## 2018-02-19 NOTE — Patient Instructions (Addendum)
[facility] Address: [address] [place], [place], [place] [address] Phone: [phone]   IF you received an x-ray today, you will receive an invoice from [facility]. Please contact [facility] at [phone] with questions or concerns regarding your invoice.   IF you received labwork today, you will receive an invoice from [place]. Please contact [facility] at [phone] with questions or concerns regarding your invoice.   Our billing staff will not be able to assist you with questions regarding bills from these companies.  You will be contacted with the lab results as soon as they are available. The fastest way to get your results is to activate your My Chart account. Instructions are located on the last page of this paperwork. If you have not heard from [place] regarding the results in 2 weeks, please contact this office.     Concussion, Adult A concussion is a brain injury from a direct hit (blow) to the head or body. This blow causes the brain to shake quickly back and forth inside the skull. This can damage brain cells and cause chemical changes in the brain. A concussion may also be known as a mild traumatic brain injury (TBI). Concussions are usually not life-threatening, but the effects of a concussion can be serious. If you have a concussion, you are more likely to experience concussion-like symptoms after a direct blow to the head in the future. What are the causes? This condition is caused by:  A direct blow to the head, such as from running into another player during a game, being hit in a fight, or hitting your head on a hard surface.  A jolt of the head or neck that causes the brain to move back and forth inside the skull, such as in a car crash.  What are the signs or symptoms? The signs of a concussion can be hard to notice. Early on, they may be missed by you, family members, and health care providers. You may look fine but act or feel differently. Symptoms are  usually temporary, but they may last for days, weeks, or even longer. Some symptoms may appear right away but other symptoms may not show up for hours or days. Every head injury is different. Symptoms may include:  Headaches. This can include a feeling of pressure in the head.  Memory problems.  Trouble concentrating, organizing, or making decisions.  Slowness in thinking, acting or reacting, speaking, or reading.  Confusion.  Fatigue.  Changes in eating or sleeping patterns.  Problems with coordination or balance.  Nausea or vomiting.  Numbness or tingling.  Sensitivity to light or noise.  Vision or hearing problems.  Reduced sense of smell.  Irritability or mood changes.  Dizziness.  Lack of motivation.  Seeing or hearing things that other people do not see or hear (hallucinations).  How is this diagnosed? This condition is diagnosed based on:  Your symptoms.  A description of your injury.  You may also have tests, including:  Imaging tests, such as a CT scan or MRI. These are done to look for signs of brain injury.  Neuropsychological tests. These measure your thinking, understanding, learning, and remembering abilities.  How is this treated? This condition is treated with physical and mental rest and careful observation, usually at home. If the concussion is severe, you may need to stay home from work for a while. You may be referred to a concussion clinic or to other health care providers for management. It is important that  you tell your health care provider if:  You are taking any medicines, including prescription medicines, over-the-counter medicines, and natural remedies. Some medicines, such as blood thinners (anticoagulants) and aspirin, may increase the chance of complications, such as bleeding.  You are taking or have taken alcohol or illegal drugs. Alcohol and certain other drugs may slow your recovery and can put you at risk of further  injury.  How fast you will recover from a concussion depends on many factors, such as how severe your concussion is, what part of your brain was injured, how old you are, and how healthy you were before the concussion. Recovery can take time. It is important to wait to return to activity until a health care provider says it is safe to do that and your symptoms are completely gone. Follow these instructions at home: Activity  Limit activities that require a lot of thought or concentration. These may include: ? Doing homework or job-related work. ? Watching TV. ? Working on the computer. ? Playing memory games and puzzles.  Rest. Rest helps the brain to heal. Make sure you: ? Get plenty of sleep at night. Avoid staying up late at night. ? Keep the same bedtime hours on weekends and weekdays. ? Rest during the day. Take naps or rest breaks when you feel tired.  Having another concussion before the first one has healed can be dangerous. Do not do high-risk activities that could cause a second concussion, such as riding a bicycle or playing sports.  Ask your health care provider when you can return to your normal activities, such as school, work, athletics, driving, riding a bicycle, or using heavy machinery. Your ability to react may be slower after a brain injury. Never do these activities if you are dizzy. Your health care provider will likely give you a plan for gradually returning to activities. General instructions  Take over-the-counter and prescription medicines only as told by your health care provider.  Do not drink alcohol until your health care provider says you can.  If it is harder than usual to remember things, write them down.  If you are easily distracted, try to do one thing at a time. For example, do not try to watch TV while fixing dinner.  Talk with family members or close friends when making important decisions.  Watch your symptoms and tell others to do the same.  Complications sometimes occur after a concussion. Older adults with a brain injury may have a higher risk of serious complications, such as a blood clot in the brain.  Tell your teachers, school nurse, school counselor, coach, athletic trainer, or work Production designer, theatre/television/film about your injury, symptoms, and restrictions. Tell them about what you can or cannot do. They should watch for: ? Increased problems with attention or concentration. ? Increased difficulty remembering or learning new information. ? Increased time needed to complete tasks or assignments. ? Increased irritability or decreased ability to cope with stress. ? Increased symptoms.  Keep all follow-up visits as told by your health care provider. This is important. How is this prevented? It is very important to avoid another brain injury, especially as you recover. In rare cases, another injury can lead to permanent brain damage, brain swelling, or death. The risk of this is greatest during the first 7-10 days after a head injury. Avoid injuries by:  Wearing a seat belt when riding in a car.  Wearing a helmet when biking, skiing, skateboarding, skating, or doing similar activities.  Avoiding activities  that could lead to a second concussion, such as contact or recreational sports, until your health care provider says it is okay.  Taking safety measures in your home, such as: ? Removing clutter and tripping hazards from floors and stairways. ? Using grab bars in bathrooms and handrails by stairs. ? Placing non-slip mats on floors and in bathtubs. ? Improving lighting in dim areas.  Contact a health care provider if:  Your symptoms get worse.  You have new symptoms.  You continue to have symptoms for more than 2 weeks. Get help right away if:  You have severe or worsening headaches.  You have weakness or numbness in any part of your body.  Your coordination gets worse.  You vomit repeatedly.  You are sleepier.  The pupil of  one eye is larger than the other.  You have convulsions or a seizure.  Your speech is slurred.  Your fatigue, confusion, or irritability gets worse.  You cannot recognize people or places.  You have neck pain.  It is difficult to wake you up.  You have unusual behavior changes.  You lose consciousness. Summary  A concussion is a brain injury from a direct hit (blow) to the head or body.  A concussion may also be called a mild traumatic brain injury (TBI).  You may have imaging tests and neuropsychological tests to diagnose a concussion.  This condition is treated with physical and mental rest and careful observation.  Ask your health care provider when you can return to your normal activities, such as school, work, athletics, driving, riding a bicycle, or using heavy machinery. Follow safety instructions as told by your health care provider. This information is not intended to replace advice given to you by your health care provider. Make sure you discuss any questions you have with your health care provider. Document Released: 10/22/2003 Document Revised: 07/12/2016 Document Reviewed: 07/12/2016 Elsevier Interactive Patient Education  [employer]2018 [employer]vier Inc[employer]
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
## 2018-02-27 ENCOUNTER — Encounter: Payer: Self-pay | Admitting: Family Medicine

## 2018-02-27 ENCOUNTER — Other Ambulatory Visit: Payer: Self-pay

## 2018-02-27 ENCOUNTER — Ambulatory Visit: Payer: BLUE CROSS/BLUE SHIELD | Admitting: Family Medicine

## 2018-02-27 VITALS — BP 118/60 | HR 85 | Temp 98.6°F | Ht 65.28 in | Wt 173.6 lb

## 2018-02-27 DIAGNOSIS — H538 Other visual disturbances: Secondary | ICD-10-CM | POA: Diagnosis not present
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
## 2018-02-27 NOTE — Patient Instructions (Signed)
     IF you received an x-ray today, you will receive an invoice from [facility]. Please contact [facility] at [phone] with questions or concerns regarding your invoice.   IF you received labwork today, you will receive an invoice from [facility]. Please contact [facility] at [phone] with questions or concerns regarding your invoice.   Our billing staff will not be able to assist you with questions regarding bills from these companies.  You will be contacted with the lab results as soon as they are available. The fastest way to get your results is to activate your My Chart account. Instructions are located on the last page of this paperwork. If you have not heard from us regarding the results in 2 weeks, please contact this office.     

## 2018-03-05 ENCOUNTER — Encounter (HOSPITAL_COMMUNITY): Payer: Self-pay

## 2018-03-05 ENCOUNTER — Ambulatory Visit (HOSPITAL_COMMUNITY)
Admission: RE | Admit: 2018-03-05 | Discharge: 2018-03-05 | Disposition: A | Discharge status: Home / Self Care | Payer: BLUE CROSS/BLUE SHIELD | Source: Ambulatory Visit | Attending: Family Medicine | Admitting: Family Medicine

## 2018-03-05 DIAGNOSIS — H538 Other visual disturbances: Secondary | ICD-10-CM | POA: Insufficient documentation

## 2018-03-05 DIAGNOSIS — Z8673 Personal history of transient ischemic attack (TIA), and cerebral infarction without residual deficits: Secondary | ICD-10-CM | POA: Diagnosis not present

## 2018-03-06 ENCOUNTER — Ambulatory Visit: Payer: BLUE CROSS/BLUE SHIELD | Admitting: Family Medicine

## 2018-03-14 ENCOUNTER — Telehealth: Payer: Self-pay | Admitting: Cardiology

## 2018-03-14 ENCOUNTER — Telehealth: Payer: Self-pay | Admitting: Family Medicine

## 2018-03-14 DIAGNOSIS — Z0271 Encounter for disability determination: Secondary | ICD-10-CM
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
## 2018-03-14 NOTE — Telephone Encounter (Signed)
Returned call to patient. She states she mistakenly picked up a few Rx(s) from a pharmacy different from her usual one when prescribed meds for a UTI and plavix was included in that. She states she felt so bad so just took the meds and then started "investigating" what meds she was taking and realized she had been doubling her plavix dose. Advised patient that she should only take this med once daily every day. Advised to monitor for bleeding issues.

## 2018-03-14 NOTE — Telephone Encounter (Signed)
New Message         Pt c/o medication issue:  1. Name of Medication: Plavix   2. How are you currently taking this medication (dosage and times per day)? Once a day 75 mg  3. Are you having a reaction (difficulty breathing--STAT)? No, but no energy   4. What is your medication issue? Patient has being doubling up on her plavix by  Mistake, wants to know what to do now. Patient has being doubling up since Saturday. None today. Pls advise.

## 2018-03-15 ENCOUNTER — Ambulatory Visit: Payer: BLUE CROSS/BLUE SHIELD | Admitting: Family Medicine

## 2018-03-15 ENCOUNTER — Encounter: Payer: Self-pay | Admitting: Family Medicine

## 2018-03-15 ENCOUNTER — Other Ambulatory Visit: Payer: Self-pay

## 2018-03-15 VITALS — BP 154/89 | HR 82 | Temp 98.3°F | Ht 65.0 in | Wt 180.0 lb

## 2018-03-15 DIAGNOSIS — H538 Other visual disturbances: Secondary | ICD-10-CM

## 2018-03-15 DIAGNOSIS — R5381 Other malaise: Secondary | ICD-10-CM | POA: Diagnosis not present

## 2018-03-15 DIAGNOSIS — I10 Essential (primary) hypertension: Secondary | ICD-10-CM

## 2018-03-15 DIAGNOSIS — I639 Cerebral infarction, unspecified: Secondary | ICD-10-CM

## 2018-03-15 DIAGNOSIS — R1030 Lower abdominal pain, unspecified: Secondary | ICD-10-CM | POA: Diagnosis not present

## 2018-03-15 DIAGNOSIS — R5383 Other fatigue: Secondary | ICD-10-CM

## 2018-03-15 DIAGNOSIS — I6930 Unspecified sequelae of cerebral infarction: Secondary | ICD-10-CM

## 2018-03-15 LAB — POC MICROSCOPIC URINALYSIS (UMFC): Mucus: ABSENT

## 2018-03-15 NOTE — Progress Notes (Signed)
8/1/201910:30 AM  Carmen Brown Jan 19, 1941, 77 y.o. female [id number]  Chief Complaint  Patient presents with  . Follow-up    blurry vision, and possible UTI. [facility], had urine dip and ulture, shows no UTI. Given meds for symptoms    HPI:   Patient is a 77 y.o. female with past medical history significant for CAD with stents on plavix since Jan 2019 who presents today for followup  Doubled up on plavix several days by mistake Has been driving Still blurry vision at short and mid distance Using reading glasses help Able to read her phone Has new glasses coming thru No other sx  Felt suddenly emotional, malaise, lower abd discmfort and blaoting On macrobid and pyridium x 2 days, not feeling much better Denies any constipation or diarrhea, fever, chills, vomiting, feels a bit nauseous not eating as much, denies any blood in stool or melena  CT head IMPRESSION: 1. No acute intracranial abnormality. 2. 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   Reports no recollection of ever having stroke sx or being told she had stroke  Fall Risk  03/15/2018 02/27/2018 02/19/2018 06/07/2017 01/16/2017  Falls in the past year? No No No No No  Number falls in past yr: - - - - -  Injury with Fall? - - - - -  Comment - - - - -     [facility] 03/15/2018 02/27/2018 02/19/2018  Decreased Interest 0 0 0  Down, Depressed, Hopeless 0 0 0  PHQ - 2 Score 0 0 0    Allergies  Allergen Reactions  . Epinephrine Other (See Comments)    unknown  . Latex     rash    Prior to Admission medications   Medication Sig Start Date End Date Taking? Authorizing Provider  atorvastatin (LIPITOR) 40 MG tablet Take 1 tablet (40 mg total) by mouth daily at 6 PM. 09/26/17  Yes Hochrein, Fayrene FearingJames, MD  clopidogrel (PLAVIX) 75 MG tablet Take 1 tablet (75 mg total) by mouth daily. 09/26/17  Yes Rollene RotundaHochrein,  James, MD  lisinopril (PRINIVIL,ZESTRIL) 20 MG tablet Take 1 tablet (20 mg total) by mouth daily. 01/29/18  Yes Rollene RotundaHochrein, James, MD  metoprolol succinate (TOPROL-XL) 25 MG 24 hr tablet Take 1 tablet (25 mg total) by mouth daily. 01/29/18  Yes Rollene RotundaHochrein, James, MD  nitroGLYCERIN (NITROSTAT) 0.4 MG SL tablet Place 1 tablet (0.4 mg total) under the tongue every 5 (five) minutes x 3 doses as needed for chest pain. 09/09/17  Yes Strader, GrenadaBrittany M, PA-C  nitrofurantoin (MACRODANTIN) 100 MG capsule Take 100 mg by mouth 2 (two) times daily. 03/10/18   [provider]  phenazopyridine (PYRIDIUM) 100 MG tablet Take 100 mg by mouth 3 (three) times daily as needed. 03/10/18   [provider]    Past Medical History:  Diagnosis Date  . Allergy   . Arthritis   . CAD (coronary artery disease)    95% proximal LAD stenosis with DES.  Jan 2019  . Cardiomegaly   . Cardiomyopathy (HCC)    EF 35% in the distant past but NL EF 2009  . Hypertension, benign    systemic  . Menopausal syndrome   . Obesity   . Osteoarthritis of lower leg, localized   . Paroxysmal VT (HCC)   . Thyroid disease     Past Surgical History:  Procedure Laterality Date  .  CARPAL TUNNEL RELEASE Right 11/2016  . LEFT HEART CATH AND CORONARY ANGIOGRAPHY N/A 09/08/2017   Procedure: LEFT HEART CATH AND CORONARY ANGIOGRAPHY;  Surgeon: Yvonne Kendall, MD;  [facility];  Service: Cardiovascular;  Laterality: N/A;  . MASS EXCISION  03/15/2012   Procedure: MINOR EXCISION OF MASS;  Surgeon: Wyn Forster., MD;  Location: [facility];  Service: Orthopedics;  Laterality: Right;  debride IP right long finger, excision mucoid cyst  . ventricular tacticartia      Social History   Tobacco Use  . Smoking status: Never Smoker  . Smokeless tobacco: Never Used  Substance Use Topics  . Alcohol use: Yes    Comment: occasional    Family History  Problem Relation Age of Onset  . Heart disease  Mother 90       MI  . Heart disease Father 71       CABG  . CAD Sister 18       Stent  . Lymphoma Sister   . Stroke Maternal Grandmother   . Diabetes Maternal Grandfather     Review of Systems  Constitutional: Positive for malaise/fatigue. Negative for chills and fever.  HENT: Negative for tinnitus.   Eyes: Positive for blurred vision. Negative for double vision and photophobia.  Respiratory: Negative for cough and shortness of breath.   Cardiovascular: Negative for chest pain, palpitations and leg swelling.  Gastrointestinal: Positive for abdominal pain and nausea. Negative for blood in stool, constipation, diarrhea, melena and vomiting.  Genitourinary: Negative for dysuria, frequency, hematuria and urgency.  Neurological: Negative for dizziness, sensory change, speech change, focal weakness and headaches.     OBJECTIVE:  Blood pressure (!) 154/89, pulse 82, temperature 98.3 F (36.8 C), temperature source Oral, height 5\' 5"  (1.651 m), weight 180 lb (81.6 kg), SpO2 96 %. Body mass index is 29.95 kg/m.   BP Readings from Last 3 Encounters:  03/15/18 (!) 154/89  02/27/18 118/60  02/19/18 132/80    Physical Exam  Constitutional: She is oriented to person, place, and time. She appears well-developed and well-nourished.  HENT:  Head: Normocephalic and atraumatic.  Mouth/Throat: Oropharynx is clear and moist. No oropharyngeal exudate.  Eyes: Pupils are equal, round, and reactive to light. EOM are normal. No scleral icterus.  Neck: Neck supple.  Cardiovascular: Normal rate, regular rhythm and normal heart sounds. Exam reveals no gallop and no friction rub.  No murmur heard. Pulmonary/Chest: Effort normal and breath sounds normal. She has no wheezes. She has no rales.  Abdominal: Soft. Bowel sounds are normal. She exhibits no distension. There is no tenderness. There is no CVA tenderness.  Musculoskeletal: She exhibits no edema.  Neurological: She is alert and oriented to  person, place, and time.  Skin: Skin is warm and dry.  Psychiatric: She has a normal mood and affect.  Nursing note and vitals reviewed.     Results for orders placed or performed in visit on 03/15/18 (from the past 72 hour(s))  POCT Microscopic Urinalysis ([facility])     Status: Abnormal   Collection Time: 03/15/18 11:19 AM  Result Value Ref Range   WBC,UR,HPF,POC Few (A) None WBC/hpf   RBC,UR,HPF,POC None None RBC/hpf   Bacteria Few (A) None, Too numerous to count   Mucus Absent Absent   Epithelial Cells, UR Per Microscopy Few (A) None, Too numerous to count cells/hpf  Urine Culture     Status: None   Collection Time: 03/15/18 11:20 AM  Result Value  Ref Range   Urine Culture, Routine Final report    Organism ID, Bacteria No growth   CBC with Differential/Platelet     Status: None   Collection Time: 03/15/18 11:27 AM  Result Value Ref Range   WBC 7.1 3.4 - 10.8 x10E3/uL   RBC 4.32 3.77 - 5.28 x10E6/uL   Hemoglobin 13.9 11.1 - 15.9 g/dL   Hematocrit 16.1 09.6 - 46.6 %   MCV 95 79 - 97 fL   MCH 32.2 26.6 - 33.0 pg   MCHC 33.7 31.5 - 35.7 g/dL   RDW 04.5 40.9 - 81.1 %   Platelets 191 150 - 450 x10E3/uL   Neutrophils 67 Not Estab. %   Lymphs 23 Not Estab. %   Monocytes 6 Not Estab. %   Eos 3 Not Estab. %   Basos 1 Not Estab. %   Neutrophils Absolute 4.8 1.4 - 7.0 x10E3/uL   Lymphocytes Absolute 1.6 0.7 - 3.1 x10E3/uL   Monocytes Absolute 0.4 0.1 - 0.9 x10E3/uL   EOS (ABSOLUTE) 0.2 0.0 - 0.4 x10E3/uL   Basophils Absolute 0.1 0.0 - 0.2 x10E3/uL   Immature Granulocytes 0 Not Estab. %   Immature Grans (Abs) 0.0 0.0 - 0.1 x10E3/uL  Comprehensive metabolic panel     Status: None   Collection Time: 03/15/18 11:27 AM  Result Value Ref Range   Glucose 98 65 - 99 mg/dL   BUN 12 8 - 27 mg/dL   Creatinine, Ser 9.14 0.57 - 1.00 mg/dL   GFR calc non Af Amer 61 >59 mL/min/1.73   GFR calc Af Amer 70 >59 mL/min/1.73   BUN/Creatinine Ratio 13 12 - 28   Sodium 140 134 - 144 mmol/L    Potassium 4.0 3.5 - 5.2 mmol/L   Chloride 102 96 - 106 mmol/L   CO2 23 20 - 29 mmol/L   Calcium 9.3 8.7 - 10.3 mg/dL   Total Protein 6.7 6.0 - 8.5 g/dL   Albumin 4.3 3.5 - 4.8 g/dL   Globulin, Total 2.4 1.5 - 4.5 g/dL   Albumin/Globulin Ratio 1.8 1.2 - 2.2   Bilirubin Total 0.6 0.0 - 1.2 mg/dL   Alkaline Phosphatase 72 39 - 117 IU/L   AST 21 0 - 40 IU/L   ALT 24 0 - 32 IU/L  TSH     Status: None   Collection Time: 03/15/18 11:27 AM  Result Value Ref Range   TSH 2.610 0.450 - 4.500 uIU/mL     ASSESSMENT and PLAN  1. Blurry vision, bilateral CT head no acute processes. Patient continues to endorse that it was very acute presentation and not insidious. Eye exam shows needs new glasses and mild macular degeneration. Will continue to monitor. Given no other sx present, part of post concussion syndrome less likely. Therefore, patient released to work without restrictions.  2. Essential hypertension Patient has not taken her medications today. Usually controlled on current regime as evidenced by previous BP, re-eval at next visit  3. Cerebrovascular accident (CVA), unspecified mechanism (HCC) - US Carotid Duplex Bilateral; Future Patient has no recollection of this event. Already on appropriate treatment: plavix, statin and BP meds. She does not smoke. Recent thorough cardiac eval was unremarkable expect for CAD. Ordering eval of carotids today. Defer further eval and mgt to neuro, has appt in sept.  4. Malaise and fatigue 5. Lower abdominal pain - POCT Microscopic Urinalysis ([facility]) - Urine Culture - CBC with Differential/Platelet - Comprehensive metabolic panel - TSH  Patient already on treatment  for UTI, sending cx in case of resistance. Complete treatment. Checking for other possible organic causes. RTC precautions given.    Return for after neurology.    Myles Lipps, MD Primary [facility] at [facility] [address] [place], [place] [address] Ph.  [phone] Fax  [phone]

## 2018-03-15 NOTE — Patient Instructions (Signed)
     IF you received an x-ray today, you will receive an invoice from [facility]. Please contact  Radiology at [phone] with questions or concerns regarding your invoice.   IF you received labwork today, you will receive an invoice from [facility]. Please contact [facility] at [phone] with questions or concerns regarding your invoice.   Our billing staff will not be able to assist you with questions regarding bills from these companies.  You will be contacted with the lab results as soon as they are available. The fastest way to get your results is to activate your My Chart account. Instructions are located on the last page of this paperwork. If you have not heard from us regarding the results in 2 weeks, please contact this office.     

## 2018-03-15 NOTE — Telephone Encounter (Signed)
Forms completed and returned to you 

## 2018-03-16 LAB — CBC WITH DIFFERENTIAL/PLATELET
Basophils Absolute: 0.1 10*3/uL (ref 0.0–0.2)
Basos: 1 %
EOS (ABSOLUTE): 0.2 10*3/uL (ref 0.0–0.4)
Eos: 3 %
Hematocrit: 41.2 % (ref 34.0–46.6)
Hemoglobin: 13.9 g/dL (ref 11.1–15.9)
Immature Grans (Abs): 0 10*3/uL (ref 0.0–0.1)
Immature Granulocytes: 0 %
Lymphocytes Absolute: 1.6 10*3/uL (ref 0.7–3.1)
Lymphs: 23 %
MCH: 32.2 pg (ref 26.6–33.0)
MCHC: 33.7 g/dL (ref 31.5–35.7)
MCV: 95 fL (ref 79–97)
Monocytes Absolute: 0.4 10*3/uL (ref 0.1–0.9)
Monocytes: 6 %
Neutrophils Absolute: 4.8 10*3/uL (ref 1.4–7.0)
Neutrophils: 67 %
Platelets: 191 10*3/uL (ref 150–450)
RBC: 4.32 x10E6/uL (ref 3.77–5.28)
RDW: 12.5 % (ref 12.3–15.4)
WBC: 7.1 10*3/uL (ref 3.4–10.8)

## 2018-03-16 LAB — COMPREHENSIVE METABOLIC PANEL
ALT: 24 IU/L (ref 0–32)
AST: 21 IU/L (ref 0–40)
Albumin/Globulin Ratio: 1.8 (ref 1.2–2.2)
Albumin: 4.3 g/dL (ref 3.5–4.8)
Alkaline Phosphatase: 72 IU/L (ref 39–117)
BUN/Creatinine Ratio: 13 (ref 12–28)
BUN: 12 mg/dL (ref 8–27)
Bilirubin Total: 0.6 mg/dL (ref 0.0–1.2)
CO2: 23 mmol/L (ref 20–29)
Calcium: 9.3 mg/dL (ref 8.7–10.3)
Chloride: 102 mmol/L (ref 96–106)
Creatinine, Ser: 0.92 mg/dL (ref 0.57–1.00)
GFR calc Af Amer: 70 mL/min/{1.73_m2} (ref >59)
GFR calc non Af Amer: 61 mL/min/{1.73_m2} (ref >59)
Globulin, Total: 2.4 g/dL (ref 1.5–4.5)
Glucose: 98 mg/dL (ref 65–99)
Potassium: 4 mmol/L (ref 3.5–5.2)
Sodium: 140 mmol/L (ref 134–144)
Total Protein: 6.7 g/dL (ref 6.0–8.5)

## 2018-03-16 LAB — URINE CULTURE: Organism ID, Bacteria: NO GROWTH

## 2018-03-16 LAB — TSH: TSH: 2.61 u[IU]/mL (ref 0.450–4.500)

## 2018-03-16 NOTE — Telephone Encounter (Signed)
Released back to work after our visit. thanks

## 2018-03-16 NOTE — Telephone Encounter (Signed)
Was patient released to return to work after her OV on 03/15/18 or does she need to stay out of work until her Neurology appointment?

## 2018-03-17 ENCOUNTER — Encounter: Payer: Self-pay | Admitting: Family Medicine

## 2018-03-19 NOTE — Telephone Encounter (Signed)
Paperwork scanned and faxed on 03/19/18

## 2018-03-21 ENCOUNTER — Telehealth: Payer: Self-pay | Admitting: Family Medicine

## 2018-03-21 NOTE — Telephone Encounter (Unsigned)
Copied from CRM [phone]. Topic: Quick Communication - See Telephone Encounter >> Mar 21, 2018 10:05 AM Carmen Brown, [name] wrote: Pt is asking regarding the test  Dr. Leretha PolSantiago wanted her to do for the veins in her neck.  Pt hasn't heard anything back yet on an appt or where it will be. Please call pt asap to let her know.

## 2018-03-23 ENCOUNTER — Encounter: Payer: Self-pay | Admitting: Radiology

## 2018-03-27 NOTE — Telephone Encounter (Signed)
Spoke with pt advised referral coordinator out of office but will touch base with her in the morning re: her appt and give her a call back with status.  Pt agreeable. Dgaddy, CMA

## 2018-03-28 NOTE — Telephone Encounter (Signed)
Routed pt u/s order to [facility] on 8/14.

## 2018-04-02 NOTE — Telephone Encounter (Signed)
Pt will come by to pick put FMLA docs on thursday

## 2018-04-10 ENCOUNTER — Ambulatory Visit
Admission: RE | Admit: 2018-04-10 | Discharge: 2018-04-10 | Disposition: A | Discharge status: Home / Self Care | Payer: BLUE CROSS/BLUE SHIELD | Source: Ambulatory Visit | Attending: Family Medicine | Admitting: Family Medicine

## 2018-04-10 DIAGNOSIS — I639 Cerebral infarction, unspecified: Secondary | ICD-10-CM

## 2018-04-25 ENCOUNTER — Ambulatory Visit: Payer: BLUE CROSS/BLUE SHIELD | Admitting: Diagnostic Neuroimaging

## 2018-04-25 ENCOUNTER — Encounter: Payer: Self-pay | Admitting: Diagnostic Neuroimaging

## 2018-04-25 ENCOUNTER — Encounter

## 2018-04-25 VITALS — BP 123/87 | HR 87 | Ht 65.0 in | Wt 181.0 lb

## 2018-04-25 DIAGNOSIS — F0781 Postconcussional syndrome: Secondary | ICD-10-CM | POA: Diagnosis not present

## 2018-04-25 DIAGNOSIS — H538 Other visual disturbances: Secondary | ICD-10-CM
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
## 2018-05-09 ENCOUNTER — Other Ambulatory Visit: Payer: Self-pay

## 2018-05-09 ENCOUNTER — Ambulatory Visit: Payer: BLUE CROSS/BLUE SHIELD | Admitting: Family Medicine

## 2018-05-09 ENCOUNTER — Encounter: Payer: Self-pay | Admitting: Family Medicine

## 2018-05-09 VITALS — BP 125/83 | HR 72 | Temp 98.5°F | Ht 65.0 in | Wt 177.2 lb

## 2018-05-09 DIAGNOSIS — F0781 Postconcussional syndrome: Secondary | ICD-10-CM

## 2018-05-09 DIAGNOSIS — H538 Other visual disturbances: Secondary | ICD-10-CM | POA: Diagnosis not present

## 2018-05-09 NOTE — Patient Instructions (Signed)
° ° ° °  If you have lab work done today you will be contacted with your lab results within the next 2 weeks.  If you have not heard from us then please contact us. The fastest way to get your results is to register for My Chart. ° ° °IF you received an x-ray today, you will receive an invoice from [facility]. Please contact [facility] at [phone] with questions or concerns regarding your invoice.  ° °IF you received labwork today, you will receive an invoice from [facility]. Please contact [facility] at [phone] with questions or concerns regarding your invoice.  ° °Our billing staff will not be able to assist you with questions regarding bills from these companies. ° °You will be contacted with the lab results as soon as they are available. The fastest way to get your results is to activate your My Chart account. Instructions are located on the last page of this paperwork. If you have not heard from us regarding the results in 2 weeks, please contact this office. °  ° ° ° °
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
## 2018-05-15 ENCOUNTER — Other Ambulatory Visit: Payer: Self-pay

## 2018-05-15 ENCOUNTER — Encounter (HOSPITAL_COMMUNITY): Payer: Self-pay | Admitting: Emergency Medicine

## 2018-05-15 ENCOUNTER — Emergency Department (HOSPITAL_COMMUNITY): Payer: BLUE CROSS/BLUE SHIELD

## 2018-05-15 ENCOUNTER — Emergency Department (HOSPITAL_COMMUNITY)
Admission: EM | Admit: 2018-05-15 | Discharge: 2018-05-15 | Disposition: A | Discharge status: Home / Self Care | Payer: BLUE CROSS/BLUE SHIELD | Attending: Emergency Medicine | Admitting: Emergency Medicine

## 2018-05-15 DIAGNOSIS — Z7982 Long term (current) use of aspirin: Secondary | ICD-10-CM | POA: Diagnosis not present

## 2018-05-15 DIAGNOSIS — R079 Chest pain, unspecified: Secondary | ICD-10-CM | POA: Diagnosis not present

## 2018-05-15 DIAGNOSIS — Z79899 Other long term (current) drug therapy: Secondary | ICD-10-CM | POA: Diagnosis not present

## 2018-05-15 DIAGNOSIS — I10 Essential (primary) hypertension: Secondary | ICD-10-CM | POA: Insufficient documentation

## 2018-05-15 DIAGNOSIS — I2510 Atherosclerotic heart disease of native coronary artery without angina pectoris: Secondary | ICD-10-CM | POA: Diagnosis not present

## 2018-05-15 LAB — CBC
HCT: 43.5 % (ref 36.0–46.0)
Hemoglobin: 14.6 g/dL (ref 12.0–15.0)
MCH: 33.3 pg (ref 26.0–34.0)
MCHC: 33.6 g/dL (ref 30.0–36.0)
MCV: 99.1 fL (ref 78.0–100.0)
PLATELETS: 182 10*3/uL (ref 150–400)
RBC: 4.39 MIL/uL (ref 3.87–5.11)
RDW: 11.9 % (ref 11.5–15.5)
WBC: 8.3 10*3/uL (ref 4.0–10.5)

## 2018-05-15 LAB — BASIC METABOLIC PANEL
Anion gap: 7 (ref 5–15)
BUN: 13 mg/dL (ref 8–23)
CHLORIDE: 107 mmol/L (ref 98–111)
CO2: 25 mmol/L (ref 22–32)
CREATININE: 0.91 mg/dL (ref 0.44–1.00)
Calcium: 9.4 mg/dL (ref 8.9–10.3)
Glucose, Bld: 91 mg/dL (ref 70–99)
Potassium: 4.8 mmol/L (ref 3.5–5.1)
Sodium: 139 mmol/L (ref 135–145)

## 2018-05-15 LAB — I-STAT TROPONIN, ED
TROPONIN I, POC: 0 ng/mL (ref 0.00–0.08)
TROPONIN I, POC: 0 ng/mL (ref 0.00–0.08)

## 2018-05-15 NOTE — ED Provider Notes (Signed)
[facility] EMERGENCY DEPARTMENT Provider Note   CSN: [id number] Arrival date & time: 05/15/18  1431     History   Chief Complaint Chief Complaint  Patient presents with  . Chest Pain    HPI Carmen Brown is a 77 y.o. female.  HPI Patient presents with chest pain.  Has had since yesterday but also had around 2 weeks ago for 3 days.  States that improved then after she took some Flonase.  States she has been gone to [employer] and stop the Flonase and the pain came back.  It is dull in her anterior upper chest.  Worse with deep breathing.  Does not feel as if she is short of breath.  History of coronary artery disease with stent and states this does not feel like that sort of pain.  No swelling in her legs.  Has gone to the beach with no recent long travel. Past Medical History:  Diagnosis Date  . Allergy   . Arthritis   . CAD (coronary artery disease)    95% proximal LAD stenosis with DES.  Jan 2019  . Cardiomegaly   . Cardiomyopathy (HCC)    EF 35% in the distant past but NL EF 2009  . Hypertension, benign    systemic  . Menopausal syndrome   . Obesity   . Osteoarthritis of lower leg, localized   . Paroxysmal VT (HCC)   . Thyroid disease     Patient Active Problem List   Diagnosis Date Noted  . Medication management 11/20/2017  . Hyperlipidemia 11/20/2017  . CAD (coronary artery disease) 09/09/2017  . Unstable angina (HCC) 09/07/2017  . Osteoarthritis of left knee 06/08/2016  . Osteoarthritis of right knee 06/08/2016  . Bilateral carpal tunnel syndrome 06/08/2016  . Degenerative disc disease, lumbar 04/06/2015  . Chest pressure 11/05/2012  . PAROXYSMAL VENTRICULAR TACHYCARDIA 08/21/2009  . OBESITY, NOS 10/12/2006  . HYPERTENSION, BENIGN SYSTEMIC 10/12/2006  . CARDIOMEGALY 10/12/2006  . RHINITIS, ALLERGIC 10/12/2006  . MENOPAUSAL SYNDROME 10/12/2006  . OSTEOARTHRITIS, LOWER LEG 10/12/2006  . CERVICAL SPINE DISORDER, NOS 10/12/2006    Past  Surgical History:  Procedure Laterality Date  . CARPAL TUNNEL RELEASE Right 11/2016  . LEFT HEART CATH AND CORONARY ANGIOGRAPHY N/A 09/08/2017   Procedure: LEFT HEART CATH AND CORONARY ANGIOGRAPHY;  Surgeon: Yvonne Kendall, MD;  Location: [facility];  Service: Cardiovascular;  Laterality: N/A;  . MASS EXCISION  03/15/2012   Procedure: MINOR EXCISION OF MASS;  Surgeon: Wyn Forster., MD;  Location: [facility];  Service: Orthopedics;  Laterality: Right;  debride IP right long finger, excision mucoid cyst  . ventricular tacticartia       OB History   None      Home Medications    Prior to Admission medications   Medication Sig Start Date End Date Taking? Authorizing Provider  aspirin EC 81 MG tablet Take 81 mg by mouth daily.   Yes [provider]  atorvastatin (LIPITOR) 40 MG tablet Take 1 tablet (40 mg total) by mouth daily at 6 PM. 09/26/17  Yes Hochrein, Fayrene Fearing, MD  Biotin 5000 MCG CAPS Take 1 capsule by mouth daily. Takes daily    Yes [provider]  clopidogrel (PLAVIX) 75 MG tablet Take 1 tablet (75 mg total) by mouth daily. 09/26/17  Yes Rollene Rotunda, MD  Coenzyme Q10 (CO Q 10) 100 MG CAPS Take 1 capsule by mouth daily.    Yes [provider]  GLUCOSAMINE SULFATE PO Take 2 capsules by mouth daily. Takes 2 daily    Yes [provider]  lisinopril (PRINIVIL,ZESTRIL) 20 MG tablet Take 1 tablet (20 mg total) by mouth daily. 01/29/18  Yes Rollene Rotunda, MD  Lutein 20 MG TABS Take 20 mg by mouth daily.    Yes [provider]  metoprolol succinate (TOPROL-XL) 25 MG 24 hr tablet Take 1 tablet (25 mg total) by mouth daily. 01/29/18  Yes Rollene Rotunda, MD  Multiple Vitamin (MULTIVITAMIN) tablet Take 2 tablets by mouth daily.   Yes [provider]  Multiple Vitamins-Minerals (PRESERVISION AREDS 2 PO) Take 2 capsules by mouth daily.    Yes [provider]  nitroGLYCERIN (NITROSTAT) 0.4 MG SL tablet  Place 1 tablet (0.4 mg total) under the tongue every 5 (five) minutes x 3 doses as needed for chest pain. 09/09/17  Yes Strader, [place] M, PA-C  Omega-3 1000 MG CAPS Take 2 capsules by mouth daily.    Yes [provider]  pyridOXINE (VITAMIN B-6) 100 MG tablet Take 100 mg by mouth daily.   Yes [provider]    Family History Family History  Problem Relation Age of Onset  . Heart disease Mother 41       MI  . Heart disease Father 71       CABG  . CAD Sister 31       Stent  . Lymphoma Sister   . Stroke Maternal Grandmother   . Diabetes Maternal Grandfather   . Diabetes Maternal Uncle     Social History Social History   Tobacco Use  . Smoking status: Never Smoker  . Smokeless tobacco: Never Used  Substance Use Topics  . Alcohol use: Not Currently  . Drug use: No     Allergies   Epinephrine and Latex   Review of Systems Review of Systems  Constitutional: Negative for appetite change.  HENT: Negative for congestion.   Respiratory: Negative for cough.   Cardiovascular: Positive for chest pain.  Gastrointestinal: Negative for abdominal pain.  Genitourinary: Negative for flank pain.  Musculoskeletal: Negative for back pain.  Skin: Negative for rash.  Neurological: Negative for weakness.  Hematological: Negative for adenopathy.  Psychiatric/Behavioral: Negative for confusion.     Physical Exam Updated Vital Signs BP 110/69   Pulse 62   Temp 98.5 F (36.9 C) (Oral)   Resp 14   Ht 5\' 5"  (1.651 m)   Wt 78.5 kg   SpO2 99%   BMI 28.79 kg/m   Physical Exam  Constitutional: She appears well-developed.  HENT:  Head: Atraumatic.  Neck: Neck supple.  Cardiovascular: Normal rate and regular rhythm.  Pulmonary/Chest: She has no rhonchi. She has no rales.  No chest tenderness  Musculoskeletal:       Right lower leg: She exhibits no edema.       Left lower leg: She exhibits no edema.  Neurological: She is alert.  Skin: Skin is warm. Capillary  refill takes less than 2 seconds.  Psychiatric: She has a normal mood and affect.     ED Treatments / Results  Labs (all labs ordered are listed, but only abnormal results are displayed) Labs Reviewed  BASIC METABOLIC PANEL  CBC  I-STAT TROPONIN, ED  I-STAT TROPONIN, ED    EKG EKG Interpretation  Date/Time:  Tuesday May 15 2018 14:35:38 EDT Ventricular Rate:  80 PR Interval:  132 QRS Duration: 80 QT Interval:  352 QTC Calculation: 405 R Axis:  53 Text Interpretation:  Normal sinus rhythm Normal ECG Confirmed by Benjiman Core [phone]) on 05/15/2018 4:50:50 PM   Radiology Dg Chest 2 View  Result Date: 05/15/2018 CLINICAL DATA:  Chest pain EXAM: CHEST - 2 VIEW COMPARISON:  09/07/2017 FINDINGS: The heart size and mediastinal contours are within normal limits. Both lungs are clear. The visualized skeletal structures are unremarkable. Normal heart size and vascularity. Aorta atherosclerotic. Trachea is midline. Degenerative changes of the spine. Coronary stents noted. IMPRESSION: No active cardiopulmonary disease. Electronically Signed   By: Judie Petit.  Shick M.D.   On: 05/15/2018 17:03    Procedures Procedures (including critical care time)  Medications Ordered in ED Medications - No data to display   Initial Impression / Assessment and Plan / ED Course  I have reviewed the triage vital signs and the nursing notes.  Pertinent labs & imaging results that were available during my care of the patient were reviewed by me and considered in my medical decision making (see chart for details).     Patient with chest pain.  EKG and lab work reassuring.  No hypoxia.  Doubt pulmonary embolism.  No leg swelling.  EKG does not show ischemia.  Troponin negative x2.  Discharge home.  Follow-up as an outpatient  Final Clinical Impressions(s) / ED Diagnoses   Final diagnoses:  None    ED Discharge Orders    None       Benjiman Core, MD 05/15/18 2009

## 2018-05-15 NOTE — ED Notes (Signed)
Patient verbalizes understanding of medications and discharge instructions. No further questions at this time. VSS and patient ambulatory at discharge.   

## 2018-05-15 NOTE — Discharge Instructions (Addendum)
Follow-up with your doctors. 

## 2018-05-15 NOTE — ED Triage Notes (Signed)
Onset 2 weeks ago chest pressure resolved and returned one day ago continued today. Pressure currently 4/10 and states sometimes hard to take a deep breath.

## 2018-05-29 ENCOUNTER — Other Ambulatory Visit: Payer: Self-pay | Admitting: Family Medicine

## 2018-05-29 DIAGNOSIS — Z0271 Encounter for disability determination: Secondary | ICD-10-CM

## 2018-05-29 NOTE — Telephone Encounter (Signed)
Requested Medications    Name from pharmacy: FLUTICASONE NASAL SP (120) RX       Will file in chart as: fluticasone (FLONASE) 50 MCG/ACT nasal spray   Sig: USE AS DIRECTED   Disp:  Not specified (Pharmacy requested: 16 mL)  Refills:  0   Start: 05/29/2018   Class: Normal   Last refill: 05/29/2018      Not on current med list, LRF /13/19  By Dr. Neva Seat

## 2018-06-26 ENCOUNTER — Other Ambulatory Visit: Payer: Self-pay | Admitting: Family Medicine

## 2018-06-26 DIAGNOSIS — J309 Allergic rhinitis, unspecified: Secondary | ICD-10-CM

## 2018-07-19 ENCOUNTER — Ambulatory Visit (INDEPENDENT_AMBULATORY_CARE_PROVIDER_SITE_OTHER): Payer: BLUE CROSS/BLUE SHIELD

## 2018-07-19 DIAGNOSIS — Z23 Encounter for immunization: Secondary | ICD-10-CM | POA: Diagnosis not present

## 2018-08-15 HISTORY — PX: CATARACT EXTRACTION: SUR2

## 2018-08-19 NOTE — Progress Notes (Signed)
Cardiology Office Note   Date:  08/20/2018   ID:  Carmen Brown, Carmen Brown Oct 04, 1940, MRN [id number]  PCP:  Myles Lipps, MD  Cardiologist:   Lewayne Bunting, MD   Chief Complaint  Patient presents with  . Coronary Artery Disease      History of Present Illness: Carmen Brown is a 78 y.o. female who presents for follow up of CAD.  She was admitted with [place] in Jan.  She was found to have 95% LAD stenosis treated with DAPT.   EF was normal.    She had shortness of breath related to Brilinta and she was switched to Plavix.  Since I last saw her she has had one episode of chest discomfort and went to the emergency room in October.  I did review these records for this appointment and there was no evidence of ischemia.  She is had a little shortness of breath but not as bad as when she was taking the Brilinta.  She is had none of the chest discomfort that was her previous unstable angina.  She is gained some weight.  She is still working as a Financial controller.  She denies any palpitations, presyncope or syncope.  She had no PND or orthopnea.   Past Medical History:  Diagnosis Date  . Allergy   . Arthritis   . CAD (coronary artery disease)    95% proximal LAD stenosis with DES.  Jan 2019  . Cardiomegaly   . Cardiomyopathy (HCC)    EF 35% in the distant past but NL EF 2009  . Hypertension, benign    systemic  . Menopausal syndrome   . Obesity   . Osteoarthritis of lower leg, localized   . Paroxysmal VT (HCC)   . Thyroid disease     Past Surgical History:  Procedure Laterality Date  . CARPAL TUNNEL RELEASE Right 11/2016  . LEFT HEART CATH AND CORONARY ANGIOGRAPHY N/A 09/08/2017   Procedure: LEFT HEART CATH AND CORONARY ANGIOGRAPHY;  Surgeon: Yvonne Kendall, MD;  [facility];  Service: Cardiovascular;  Laterality: N/A;  . MASS EXCISION  03/15/2012   Procedure: MINOR EXCISION OF MASS;  Surgeon: Wyn Forster., MD;  Location: [facility];   Service: Orthopedics;  Laterality: Right;  debride IP right long finger, excision mucoid cyst  . ventricular tacticartia       Current Outpatient Medications  Medication Sig Dispense Refill  . aspirin EC 81 MG tablet Take 81 mg by mouth daily.    Marland Kitchen atorvastatin (LIPITOR) 40 MG tablet Take 1 tablet (40 mg total) by mouth daily at 6 PM. 90 tablet 3  . Biotin 5000 MCG CAPS Take 1 capsule by mouth daily. Takes daily     . Coenzyme Q10 (CO Q 10) 100 MG CAPS Take 1 capsule by mouth daily.     Marland Kitchen GLUCOSAMINE SULFATE PO Take 2 capsules by mouth daily. Takes 2 daily     . lisinopril (PRINIVIL,ZESTRIL) 20 MG tablet Take 1 tablet (20 mg total) by mouth daily. 90 tablet 3  . Lutein 20 MG TABS Take 20 mg by mouth daily.     . metoprolol succinate (TOPROL-XL) 25 MG 24 hr tablet Take 1 tablet (25 mg total) by mouth daily. 90 tablet 3  . Multiple Vitamin (MULTIVITAMIN) tablet Take 2 tablets by mouth daily.    . Multiple Vitamins-Minerals (PRESERVISION AREDS 2 PO) Take 2 capsules by mouth daily.     Marland Kitchen  nitroGLYCERIN (NITROSTAT) 0.4 MG SL tablet Place 1 tablet (0.4 mg total) under the tongue every 5 (five) minutes x 3 doses as needed for chest pain. 25 tablet 2  . Omega-3 1000 MG CAPS Take 2 capsules by mouth daily.     Marland Kitchen pyridOXINE (VITAMIN B-6) 100 MG tablet Take 100 mg by mouth daily.    . fluticasone (FLONASE) 50 MCG/ACT nasal spray Place 1 spray into both nostrils daily. 16 g 2   No current facility-administered medications for this visit.     Allergies:   Epinephrine and Latex    ROS:  Please see the history of present illness.   Otherwise, review of systems are positive for none.   All other systems are reviewed and negative.    PHYSICAL EXAM: VS:  BP 106/68   Pulse 79   Ht 5\' 5"  (1.651 m)   Wt 186 lb 3.2 oz (84.5 kg)   SpO2 97%   BMI 30.99 kg/m  , BMI Body mass index is 30.99 kg/m.  GENERAL:  Well appearing NECK:  No jugular venous distention, waveform within normal limits, carotid  upstroke brisk and symmetric, no bruits, no thyromegaly LUNGS:  Clear to auscultation bilaterally CHEST:  Unremarkable HEART:  PMI not displaced or sustained,S1 and S2 within normal limits, no S3, no S4, no clicks, no rubs, no murmurs ABD:  Flat, positive bowel sounds normal in frequency in pitch, no bruits, no rebound, no guarding, no midline pulsatile mass, no hepatomegaly, no splenomegaly EXT:  2 plus pulses throughout, no edema, no cyanosis no clubbing   EKG:  EKG is not ordered today.   Recent Labs: 03/15/2018: ALT 24; TSH 2.610 05/15/2018: BUN 13; Creatinine, Ser 0.91; Hemoglobin 14.6; Platelets 182; Potassium 4.8; Sodium 139    Lipid Panel    Component Value Date/Time   CHOL 105 11/20/2017 1104   TRIG 100 11/20/2017 1104   HDL 42 11/20/2017 1104   CHOLHDL 2.5 11/20/2017 1104   CHOLHDL 3.9 09/08/2017 0259   VLDL 20 09/08/2017 0259   LDLCALC 43 11/20/2017 1104      Wt Readings from Last 3 Encounters:  08/20/18 186 lb 3.2 oz (84.5 kg)  05/15/18 173 lb (78.5 kg)  05/09/18 177 lb 3.2 oz (80.4 kg)      Other studies Reviewed: Additional studies/ records that were reviewed today include:  ED records Review of the above records demonstrates:  See elsewhere   ASSESSMENT AND PLAN:   CAD:  She can stop her Plavix.  The patient has no new sypmtoms.  No further cardiovascular testing is indicated.  We will continue with aggressive risk reduction and meds as listed.  HTN:  The blood pressure is at target.  No change in therapy.    DYSLIPIDEMIA: Her lipids were excellent in April.  She will get this checked by her primary provider when she sees her in March.  Current medicines are reviewed at length with the patient today.  The patient does not have concerns regarding medicines.  The following changes have been made:  As above.   Labs/ tests ordered today include:  None  No orders of the defined types were placed in this encounter.    Disposition:   FU with me in 12  months.   Signed, Rollene Rotunda, MD  08/20/2018 10:55 AM     [facility]

## 2018-08-20 ENCOUNTER — Encounter: Payer: Self-pay | Admitting: Cardiology

## 2018-08-20 ENCOUNTER — Ambulatory Visit: Payer: BLUE CROSS/BLUE SHIELD | Admitting: Cardiology

## 2018-08-20 VITALS — BP 106/68 | HR 79 | Ht 65.0 in | Wt 186.2 lb

## 2018-08-20 DIAGNOSIS — I10 Essential (primary) hypertension: Secondary | ICD-10-CM

## 2018-08-20 DIAGNOSIS — I2510 Atherosclerotic heart disease of native coronary artery without angina pectoris: Secondary | ICD-10-CM | POA: Diagnosis not present

## 2018-08-20 DIAGNOSIS — E785 Hyperlipidemia, unspecified: Secondary | ICD-10-CM | POA: Insufficient documentation

## 2018-08-20 MED ORDER — FLUTICASONE PROPIONATE 50 MCG/ACT NA SUSP: 1.0000 | Freq: Every day | NASAL | 2 refills | Status: DC

## 2018-08-20 NOTE — Patient Instructions (Addendum)
Medication Instructions:  STOP- Plavix  If you need a refill on your cardiac medications before your next appointment, please call your pharmacy.   Lab work: None Ordered  If you have labs (blood work) drawn today and your tests are completely normal, you will receive your results only by: Marland Kitchen MyChart Message (if you have MyChart) OR . A paper copy in the mail If you have any lab test that is abnormal or we need to change your treatment, we will call you to review the results.  Testing/Procedures: None Ordered  Follow-Up: At [facility], you and your health needs are our priority.  As part of our continuing mission to provide you with exceptional heart care, we have created designated Provider Care Teams.  These Care Teams include your primary Cardiologist (physician) and Advanced Practice Providers (APPs -  Physician Assistants and Nurse Practitioners) who all work together to provide you with the care you need, when you need it. You will need a follow up appointment in 12 months.  Please call our office 2 months in advance to schedule this appointment.  You may see Dr Antoine Poche  or one of the following Advanced Practice Providers on your designated Care Team:   Theodore Demark, PA-C . Joni Reining, DNP, ANP

## 2018-10-04 ENCOUNTER — Other Ambulatory Visit: Payer: Self-pay | Admitting: Cardiology

## 2018-10-12 ENCOUNTER — Ambulatory Visit: Payer: BLUE CROSS/BLUE SHIELD | Admitting: Emergency Medicine

## 2019-01-14 ENCOUNTER — Other Ambulatory Visit: Payer: Self-pay | Admitting: Cardiology

## 2019-01-14 DIAGNOSIS — I10 Essential (primary) hypertension: Secondary | ICD-10-CM

## 2019-03-14 ENCOUNTER — Ambulatory Visit: Payer: BLUE CROSS/BLUE SHIELD | Admitting: Family Medicine

## 2019-05-21 ENCOUNTER — Other Ambulatory Visit: Payer: Self-pay | Admitting: Cardiology

## 2019-05-22 ENCOUNTER — Telehealth: Payer: Self-pay | Admitting: Cardiology

## 2019-05-22 NOTE — Telephone Encounter (Signed)
Dr. Percival Spanish   Can you please address this patients ASA? Pt is to undergo a left carpal tunnel release procedure with mass removal on 05/28/2019. Surgical team is asking to hold her ASA for an undetermined time.   She was last seen by you on 08/20/2018 and was doing well from a cardiac perspective. She has a hx of CAD, found to have a 95% LAD stenosis treated with DAPT, HTN, HLD.   Please send your recommendations to the pre-op pool.   Thank you  Sharee Pimple

## 2019-05-22 NOTE — Telephone Encounter (Signed)
   [facility] Pre-operative Risk Assessment    Request for surgical clearance:  1. What type of surgery is being performed? Left carpal tunnel release, mass removal   2. When is this surgery scheduled? 05/28/2019   3. What type of clearance is required (medical clearance vs. Pharmacy clearance to hold med vs. Both)? Both   4. Are there any medications that need to be held prior to surgery and how long? ASA   5. Practice name and name of physician performing surgery? Dr. Roseanne Kaufman @ [facility]   6. What is your office phone number [phone]    7.   What is your office fax number [phone] Attn: Orson Slick  8.   Anesthesia type (None, local, MAC, general) ? Block with IV sedation    Carmen Brown 05/22/2019, 8:01 AM  _________________________________________________________________   (provider comments below)

## 2019-05-22 NOTE — Telephone Encounter (Signed)
I only suggest holding the ASA if the risk of bleeding is prohibitive in this situation.

## 2019-05-23 NOTE — Telephone Encounter (Signed)
   Primary Cardiologist: Minus Breeding, MD  Chart reviewed as part of pre-operative protocol coverage. Given past medical history and time since last visit, based on ACC/AHA guidelines, DYNASTIE KNOOP would be at acceptable risk for the planned procedure without further cardiovascular testing.   Per Dr. Percival Spanish, it is only recommended holding ASA if the bleeding risk is prohibitive in this situation.   I will route this recommendation to the requesting party via Epic fax function and remove from pre-op pool.  Please call with questions.  Kathyrn Drown, NP 05/23/2019, 8:15 AM

## 2019-08-22 ENCOUNTER — Ambulatory Visit: Payer: BLUE CROSS/BLUE SHIELD | Admitting: Cardiology

## 2019-08-27 NOTE — Progress Notes (Signed)
Cardiology Office Note   Date:  08/29/2019   ID:  Carmen Brown, Carmen Brown December 09, 1940, MRN [id number]  PCP:  Rutherford Guys, MD  Cardiologist:   Minus Breeding, MD   Chief Complaint  Patient presents with  . Shortness of Breath      History of Present Illness: Carmen Brown is a 79 y.o. female who presents for follow up of CAD.  She was admitted with [place] in Jan 2019.  She was found to have 95% LAD stenosis treated stent.  EF was normal.     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  She was walking in the summer but has stopped doing this because of the heat.  She has had decreased energy.  She has had about a 10 pound weight gain.  She has shortness of breath which happens when she bends over or maybe when she is walking briskly.  She is not describing PND or orthopnea.  She is not having any chest pressure, neck or arm discomfort.  She is not having any palpitations, presyncope or syncope.   Past Medical History:  Diagnosis Date  . Allergy   . Arthritis   . CAD (coronary artery disease)    95% proximal LAD stenosis with DES.  Jan 2019  . Cardiomegaly   . Cardiomyopathy ([place])    EF 35% in the distant past but NL EF 2009  . Hypertension, benign    systemic  . Menopausal syndrome   . Obesity   . Osteoarthritis of lower leg, localized   . Paroxysmal VT ([place])   . Thyroid disease     Past Surgical History:  Procedure Laterality Date  . CARPAL TUNNEL RELEASE Right 11/2016  . LEFT HEART CATH AND CORONARY ANGIOGRAPHY N/A 09/08/2017   Procedure: LEFT HEART CATH AND CORONARY ANGIOGRAPHY;  Surgeon: Nelva Bush, MD;  Location: [facility];  Service: Cardiovascular;  Laterality: N/A;  . MASS EXCISION  03/15/2012   Procedure: MINOR EXCISION OF MASS;  Surgeon: Cammie Sickle., MD;  Location: [facility];  Service: Orthopedics;  Laterality: Right;  debride IP right long finger, excision mucoid cyst  . ventricular  tacticartia       Current Outpatient Medications  Medication Sig Dispense Refill  . aspirin EC 81 MG tablet Take 81 mg by mouth daily.    Marland Kitchen atorvastatin (LIPITOR) 40 MG tablet TAKE 1 TABLET BY MOUTH EVERY DAY AT 6 PM 90 tablet 3  . Biotin 5000 MCG CAPS Take 1 capsule by mouth daily. Takes daily     . Coenzyme Q10 (CO Q 10) 100 MG CAPS Take 1 capsule by mouth daily.     . fluticasone (FLONASE) 50 MCG/ACT nasal spray Place 1 spray into both nostrils daily. 16 g 2  . GLUCOSAMINE SULFATE PO Take 2 capsules by mouth daily. Takes 2 daily     . lisinopril (ZESTRIL) 20 MG tablet TAKE 1 TABLET(20 MG) BY MOUTH DAILY 90 tablet 3  . Lutein 20 MG TABS Take 20 mg by mouth daily.     . metoprolol succinate (TOPROL-XL) 25 MG 24 hr tablet TAKE 1 TABLET(25 MG) BY MOUTH DAILY 90 tablet 3  . Multiple Vitamin (MULTIVITAMIN) tablet Take 2 tablets by mouth daily.    . Multiple Vitamins-Minerals (PRESERVISION AREDS 2 PO) Take 2 capsules by mouth daily.     . nitroGLYCERIN (NITROSTAT) 0.4 MG SL tablet Place  1 tablet (0.4 mg total) under the tongue every 5 (five) minutes x 3 doses as needed for chest pain. 25 tablet 2  . Omega-3 1000 MG CAPS Take 2 capsules by mouth daily.     Marland Kitchen pyridOXINE (VITAMIN B-6) 100 MG tablet Take 100 mg by mouth daily.     No current facility-administered medications for this visit.    Allergies:   Epinephrine and Latex    ROS:  Please see the history of present illness.   Otherwise, review of systems are positive for fatigue and decreased sleeping.   All other systems are reviewed and negative.    PHYSICAL EXAM: VS:  BP (!) 156/90   Pulse 66   Temp (!) 97.1 F (36.2 C)   Ht 5\' 5"  (1.651 m)   Wt 199 lb 4.8 oz (90.4 kg)   SpO2 97%   BMI 33.17 kg/m  , BMI Body mass index is 33.17 kg/m.  GENERAL:  Well appearing NECK:  No jugular venous distention, waveform within normal limits, carotid upstroke brisk and symmetric, no bruits, no thyromegaly LUNGS:  Clear to auscultation  bilaterally CHEST:  Unremarkable HEART:  PMI not displaced or sustained,S1 and S2 within normal limits, no S3, no S4, no clicks, no rubs, no murmurs ABD:  Flat, positive bowel sounds normal in frequency in pitch, no bruits, no rebound, no guarding, no midline pulsatile mass, no hepatomegaly, no splenomegaly EXT:  2 plus pulses throughout, no edema, no cyanosis no clubbing   EKG:  EKG is  ordered today. Sinus rhythm, rate 66, axis within normal limits, intervals within normal limits, no acute ST-T wave changes.  Recent Labs: No results found for requested labs within last 8760 hours.    Lipid Panel    Component Value Date/Time   CHOL 105 11/20/2017 1104   TRIG 100 11/20/2017 1104   HDL 42 11/20/2017 1104   CHOLHDL 2.5 11/20/2017 1104   CHOLHDL 3.9 09/08/2017 0259   VLDL 20 09/08/2017 0259   LDLCALC 43 11/20/2017 1104      Wt Readings from Last 3 Encounters:  08/29/19 199 lb 4.8 oz (90.4 kg)  08/20/18 186 lb 3.2 oz (84.5 kg)  05/15/18 173 lb (78.5 kg)      Other studies Reviewed: Additional studies/ records that were reviewed today include:   None Review of the above records demonstrates:     ASSESSMENT AND PLAN:   CAD:    We recommend he continue with risk reduction and otherwise plans as below.  HTN:  The blood pressure is elevated but this is quite unusual.  She is going to make sure she has a working blood pressure cuff and keep a blood pressure diary.   DYSLIPIDEMIA:   She is past due for follow-up of this and I will order labs today to include a fasting lipid profile.   DYSPNEA: She has bendopathy which I would not suspect is an anginal equivalent or related to heart failure.  We talked about how to bradycardia when bending over we talked about weight gain as a contributor.  She will let me know if this worsens.  We talked about an exercise regimen.  If she loses weight and increase his activity and over the next few months her breathing gets worse rather than  better I will have a low threshold for screening for obstructive coronary disease.  FATIGUE: She is not sleeping well.  We talked about melatonin.  COVID EDUCATION:  We talked about the vaccine.  She  is interested in getting this when she is able.  Current medicines are reviewed at length with the patient today.  The patient does not have concerns regarding medicines.  The following changes have been made:  None  Labs/ tests ordered today include:   Orders Placed This Encounter  Procedures  . CBC  . Comprehensive Metabolic Panel (CMET)  . TSH  . Lipid Profile  . B Nat Peptide  . EKG 12-Lead     Disposition:   FU with me in 3 months.   Signed, Rollene Rotunda, MD  08/29/2019 10:33 AM    [facility]

## 2019-08-28 DIAGNOSIS — Z7189 Other specified counseling: Secondary | ICD-10-CM | POA: Insufficient documentation

## 2019-08-29 ENCOUNTER — Encounter (INDEPENDENT_AMBULATORY_CARE_PROVIDER_SITE_OTHER): Payer: Self-pay

## 2019-08-29 ENCOUNTER — Ambulatory Visit: Payer: BC Managed Care – PPO | Admitting: Cardiology

## 2019-08-29 ENCOUNTER — Other Ambulatory Visit: Payer: Self-pay

## 2019-08-29 ENCOUNTER — Encounter: Payer: Self-pay | Admitting: Cardiology

## 2019-08-29 VITALS — BP 156/90 | HR 66 | Temp 97.1°F | Ht 65.0 in | Wt 199.3 lb

## 2019-08-29 DIAGNOSIS — I10 Essential (primary) hypertension: Secondary | ICD-10-CM | POA: Diagnosis not present

## 2019-08-29 DIAGNOSIS — I2510 Atherosclerotic heart disease of native coronary artery without angina pectoris: Secondary | ICD-10-CM

## 2019-08-29 DIAGNOSIS — Z7189 Other specified counseling: Secondary | ICD-10-CM | POA: Diagnosis not present

## 2019-08-29 DIAGNOSIS — E785 Hyperlipidemia, unspecified: Secondary | ICD-10-CM | POA: Diagnosis not present

## 2019-08-29 DIAGNOSIS — R0602 Shortness of breath: Secondary | ICD-10-CM

## 2019-08-29 DIAGNOSIS — R5383 Other fatigue: Secondary | ICD-10-CM

## 2019-08-29 NOTE — Patient Instructions (Signed)
Medication Instructions:  No changes *If you need a refill on your cardiac medications before your next appointment, please call your pharmacy*  Lab Work: Your physician recommends that you return for lab work today (BNP, CBC, CMP, TSH, Lipids)  If you have labs (blood work) drawn today and your tests are completely normal, you will receive your results only by: Marland Kitchen MyChart Message (if you have MyChart) OR . A paper copy in the mail If you have any lab test that is abnormal or we need to change your treatment, we will call you to review the results.  Testing/Procedures: None  Follow-Up: At [facility], you and your health needs are our priority.  As part of our continuing mission to provide you with exceptional heart care, we have created designated Provider Care Teams.  These Care Teams include your primary Cardiologist (physician) and Advanced Practice Providers (APPs -  Physician Assistants and Nurse Practitioners) who all work together to provide you with the care you need, when you need it.  Your next appointment:   3 month(s)   The format for your next appointment:   In Person  Provider:   Rollene Rotunda, MD  Other Instructions Buy a Pulse Ox Purchase an Omron automatic blood pressure cuff for your upper arm.

## 2019-09-02 LAB — COMPREHENSIVE METABOLIC PANEL
ALT: 23 IU/L (ref 0–32)
AST: 26 IU/L (ref 0–40)
Albumin/Globulin Ratio: 1.6 (ref 1.2–2.2)
Albumin: 4.2 g/dL (ref 3.7–4.7)
Alkaline Phosphatase: 83 IU/L (ref 39–117)
BUN/Creatinine Ratio: 15 (ref 12–28)
BUN: 14 mg/dL (ref 8–27)
Bilirubin Total: 0.3 mg/dL (ref 0.0–1.2)
CO2: 18 mmol/L — ABNORMAL LOW (ref 20–29)
Calcium: 9.8 mg/dL (ref 8.7–10.3)
Chloride: 111 mmol/L — ABNORMAL HIGH (ref 96–106)
Creatinine, Ser: 0.96 mg/dL (ref 0.57–1.00)
GFR calc Af Amer: 66 mL/min/{1.73_m2} (ref >59)
GFR calc non Af Amer: 58 mL/min/{1.73_m2} — ABNORMAL LOW (ref >59)
Globulin, Total: 2.7 g/dL (ref 1.5–4.5)
Glucose: 94 mg/dL (ref 65–99)
Potassium: 5.2 mmol/L (ref 3.5–5.2)
Sodium: 147 mmol/L — ABNORMAL HIGH (ref 134–144)
Total Protein: 6.9 g/dL (ref 6.0–8.5)

## 2019-09-02 LAB — CBC
Hematocrit: 39.8 % (ref 34.0–46.6)
Hemoglobin: 13.7 g/dL (ref 11.1–15.9)
MCH: 33.9 pg — ABNORMAL HIGH (ref 26.6–33.0)
MCHC: 34.4 g/dL (ref 31.5–35.7)
MCV: 99 fL — ABNORMAL HIGH (ref 79–97)
Platelets: 205 10*3/uL (ref 150–450)
RBC: 4.04 x10E6/uL (ref 3.77–5.28)
RDW: 12.5 % (ref 11.7–15.4)
WBC: 6.7 10*3/uL (ref 3.4–10.8)

## 2019-09-02 LAB — LIPID PANEL
Chol/HDL Ratio: 2.5 ratio (ref 0.0–4.4)
Cholesterol, Total: 114 mg/dL (ref 100–199)
HDL: 46 mg/dL (ref >39)
LDL Chol Calc (NIH): 46 mg/dL (ref 0–99)
Triglycerides: 127 mg/dL (ref 0–149)
VLDL Cholesterol Cal: 22 mg/dL (ref 5–40)

## 2019-09-02 LAB — BRAIN NATRIURETIC PEPTIDE: BNP: 36.3 pg/mL (ref 0.0–100.0)

## 2019-09-02 LAB — TSH: TSH: 3.78 u[IU]/mL (ref 0.450–4.500)

## 2019-09-05 ENCOUNTER — Other Ambulatory Visit: Payer: Self-pay

## 2019-09-05 ENCOUNTER — Telehealth: Payer: Self-pay

## 2019-09-05 DIAGNOSIS — I2510 Atherosclerotic heart disease of native coronary artery without angina pectoris: Secondary | ICD-10-CM

## 2019-09-05 DIAGNOSIS — Z79899 Other long term (current) drug therapy: Secondary | ICD-10-CM

## 2019-09-05 DIAGNOSIS — R0602 Shortness of breath: Secondary | ICD-10-CM

## 2019-09-05 NOTE — Telephone Encounter (Signed)
-----   Message from Rollene Rotunda, MD sent at 09/04/2019 10:02 PM EST ----- Normal except for the Na, Cl and bicarb.  Please repeat the BMET.

## 2019-09-05 NOTE — Telephone Encounter (Signed)
Order placed for repeat BMET. Lab slips mailed to patient. Patient verbalized understanding.

## 2019-09-10 ENCOUNTER — Telehealth: Payer: Self-pay | Admitting: Cardiology

## 2019-09-10 DIAGNOSIS — I10 Essential (primary) hypertension: Secondary | ICD-10-CM

## 2019-09-10 MED ORDER — LISINOPRIL 40 MG PO TABS: 40.0000 mg | ORAL_TABLET | Freq: Every day | ORAL | 1 refills | Status: DC

## 2019-09-10 NOTE — Telephone Encounter (Signed)
Recommendations:  1. increase lisinopril dose to 40mg  daily (start tomorrow morning) 2. continue metoprolol 25mg  daily  3. monitor blood pressure twice daily (morning and evening) and keep record of readings 4. Increase hydration  5. Okay to take Mucinex for up to 3 days OR use saline nasal solution to relieve congestion. 6. Repeat blood work in 1 week as directed by Dr 7. Schedule HTN follow up visit 3-4 weeks to assess response to therapy (HTn clinic or APP)

## 2019-09-10 NOTE — Telephone Encounter (Signed)
Received call from patient c/o of high BP.  She states she doesn't usually take her BP but after her appt with Dr. Antoine Poche 1/14 she bought a BP cuff and started recording BP yesterday (see previous note for BP readings).   Some readings are in the morning before medication and some readings are in the afternoon.   Denies chest pain, SOB, dizziness, blurred vision, has a slight HA but thinks this is a "sinus" HA as the pain is across her nose.   She took her AM meds about 30 mins ago.     She currently takes lisinipril 20 mg daily AM and metoprolol succinate 25 mg daily AM.      Advised would route for review and recommendations.  Patient aware.

## 2019-09-10 NOTE — Telephone Encounter (Signed)
Spoke to patient, aware of recommendations and verbalized understanding.     New rx sent to pharmacy.  Lab order active.  appt scheduled with HTN clinic 2/16.

## 2019-09-10 NOTE — Telephone Encounter (Signed)
New Message  Pt c/o BP issue: STAT if pt c/o blurred vision, one-sided weakness or slurred speech  1. What are your last 5 BP readings? 174/125; 169/118; 83; 174/125; 82; 168/110; 74; 184/112; 75  2. Are you having any other symptoms (ex. Dizziness, headache, blurred vision, passed out)? Pressure around nose   3. What is your BP issue? BP is abnormally high

## 2019-09-17 LAB — BASIC METABOLIC PANEL
BUN/Creatinine Ratio: 17 (ref 12–28)
BUN: 17 mg/dL (ref 8–27)
CO2: 27 mmol/L (ref 20–29)
Calcium: 9.6 mg/dL (ref 8.7–10.3)
Chloride: 103 mmol/L (ref 96–106)
Creatinine, Ser: 0.98 mg/dL (ref 0.57–1.00)
GFR calc Af Amer: 64 mL/min/{1.73_m2} (ref >59)
GFR calc non Af Amer: 56 mL/min/{1.73_m2} — ABNORMAL LOW (ref >59)
Glucose: 66 mg/dL (ref 65–99)
Potassium: 5.5 mmol/L — ABNORMAL HIGH (ref 3.5–5.2)
Sodium: 143 mmol/L (ref 134–144)

## 2019-09-24 ENCOUNTER — Other Ambulatory Visit: Payer: Self-pay

## 2019-09-24 ENCOUNTER — Other Ambulatory Visit: Payer: Self-pay | Admitting: Family Medicine

## 2019-09-24 DIAGNOSIS — I2510 Atherosclerotic heart disease of native coronary artery without angina pectoris: Secondary | ICD-10-CM

## 2019-09-24 NOTE — Progress Notes (Signed)
Repeat BMP order due to elevated potassium.

## 2019-09-24 NOTE — Telephone Encounter (Signed)
Requested medication (s) are due for refill today: yes  Requested medication (s) are on the active medication list: yes  Last refill:  12/25/2017  Future visit scheduled: no  Notes to clinic:  historical provider; no valid encounter within last 12 months    Requested Prescriptions  Pending Prescriptions Disp Refills   fluticasone (FLONASE) 50 MCG/ACT nasal spray [Pharmacy Med Name: FLUTICASONE PROP 50 MCG SPRAY] 16 mL     Sig: INHALE 2 SPRAYS INTO THE NOSE DAILY.      Ear, Nose, and Throat: Nasal Preparations - Corticosteroids Failed - 09/24/2019  9:35 AM      Failed - Valid encounter within last 12 months    Recent Outpatient Visits           1 year ago Blurry vision, bilateral   [facility], Meda Coffee, MD   1 year ago Blurry vision, bilateral   [facility], Meda Coffee, MD   1 year ago Blurry vision, bilateral   [facility], Meda Coffee, MD   1 year ago Blurry vision, bilateral   [facility], Meda Coffee, MD   2 years ago Primary osteoarthritis of both knees   [facility], Marolyn Hammock, PA-C       Future Appointments             In 1 week  [facility], [facility]   In 2 months Rollene Rotunda, MD [facility], [facility]

## 2019-09-28 LAB — BASIC METABOLIC PANEL
BUN/Creatinine Ratio: 16 (ref 12–28)
BUN: 14 mg/dL (ref 8–27)
CO2: 21 mmol/L (ref 20–29)
Calcium: 9.5 mg/dL (ref 8.7–10.3)
Chloride: 107 mmol/L — ABNORMAL HIGH (ref 96–106)
Creatinine, Ser: 0.85 mg/dL (ref 0.57–1.00)
GFR calc Af Amer: 76 mL/min/{1.73_m2} (ref >59)
GFR calc non Af Amer: 66 mL/min/{1.73_m2} (ref >59)
Glucose: 110 mg/dL — ABNORMAL HIGH (ref 65–99)
Potassium: 4.1 mmol/L (ref 3.5–5.2)
Sodium: 144 mmol/L (ref 134–144)

## 2019-10-01 ENCOUNTER — Ambulatory Visit (INDEPENDENT_AMBULATORY_CARE_PROVIDER_SITE_OTHER): Payer: BC Managed Care – PPO | Admitting: Pharmacist

## 2019-10-01 ENCOUNTER — Other Ambulatory Visit: Payer: Self-pay

## 2019-10-01 VITALS — BP 142/86 | HR 70 | Ht 64.0 in | Wt 196.0 lb

## 2019-10-01 DIAGNOSIS — I10 Essential (primary) hypertension: Secondary | ICD-10-CM

## 2019-10-01 MED ORDER — LISINOPRIL 20 MG PO TABS: 20.0000 mg | ORAL_TABLET | Freq: Two times a day (BID) | ORAL | 3 refills | Status: DC

## 2019-10-01 MED ORDER — FLUTICASONE PROPIONATE 50 MCG/ACT NA SUSP: 1.0000 | Freq: Two times a day (BID) | NASAL | 3 refills | Status: DC

## 2019-10-01 NOTE — Patient Instructions (Addendum)
Return for a  follow up appointment in 4 weeks  Check your blood pressure at home daily (if able) and keep record of the readings.  Take your BP meds as follows: Change lisinopril to 20mg  every morning and 20mg  every evening   Bring all of your meds, your BP cuff and your record of home blood pressures to your next appointment.  Exercise as you're able, try to walk approximately 30 minutes per day.  Keep salt intake to a minimum, especially watch canned and prepared boxed foods.  Eat more fresh fruits and vegetables and fewer canned items.  Avoid eating in fast food restaurants.    HOW TO TAKE YOUR BLOOD PRESSURE: . Rest 5 minutes before taking your blood pressure. .  Don't smoke or drink caffeinated beverages for at least 30 minutes before. . Take your blood pressure before (not after) you eat. . Sit comfortably with your back supported and both feet on the floor (don't cross your legs). . Elevate your arm to heart level on a table or a desk. . Use the proper sized cuff. It should fit smoothly and snugly around your bare upper arm. There should be enough room to slip a fingertip under the cuff. The bottom edge of the cuff should be 1 inch above the crease of the elbow. . Ideally, take 3 measurements at one sitting and record the average.

## 2019-10-01 NOTE — Progress Notes (Signed)
Patient ID: Carmen Brown                 DOB: 1940/08/24                      MRN: [id number]      HPI:  Carmen Brown is a 79 y.o. female referred by Dr. Antoine Poche to HTN clinic. PMH includes  CAD, hypertension, dyslipidemia.  Denies dizziness, swelling, chest pain, or headaches. Reports some drowsiness since lisinopril dose increased. Repeat BMET shows stable renal function and electrolytes. Patient presents to HTN clinic for further evualation and medication titration. 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 Admits dietary indiscretions and lack of physical activity. Will prefer is we use less than 3 BP mediation for blood pressure management. She experienced severe fatigue and dizziness int he past when taking 3 medication for BP.    Current HTN meds:  Lisinopril 40mg  daily - dose increased 09/10/2019 Metoprolol succ 25mg  daily  Intolerance: unknown medication - severe fatigue and weakness  BP goal: <130/80  Family History: mother massive MI, father open heart surgery, stent in sister   Social History: denies tobacco products, occasional alcohol use  Diet: eats out sometimes, occasional caffeine,   Exercise: activities of daily living  Home BP readings: 18 morning readings ; average 157/103; no HR reported 21 evening readings; average 159/101  Omron arm cuff - accurate within 09/12/2019 from manual reading  Wt Readings from Last 3 Encounters:  10/01/19 196 lb (88.9 kg)  08/29/19 199 lb 4.8 oz (90.4 kg)  08/20/18 186 lb 3.2 oz (84.5 kg)   BP Readings from Last 3 Encounters:  10/01/19 (!) 142/86  08/29/19 (!) 156/90  08/20/18 106/68   Pulse Readings from Last 3 Encounters:  10/01/19 70  08/29/19 66  08/20/18 79    Past Medical History:  Diagnosis Date  . Allergy   . Arthritis   . CAD (coronary artery disease)    95% proximal LAD stenosis with DES.  Jan 2019  . Cardiomegaly   . Cardiomyopathy ([facility])    EF 35% in the distant  past but NL EF 2009  . Hypertension, benign    systemic  . Menopausal syndrome   . Obesity   . Osteoarthritis of lower leg, localized   . Paroxysmal VT ([facility])   . Thyroid disease     Current Outpatient Medications on File Prior to Visit  Medication Sig Dispense Refill  . aspirin EC 81 MG tablet Take 81 mg by mouth daily.    Feb 2019 atorvastatin (LIPITOR) 40 MG tablet TAKE 1 TABLET BY MOUTH EVERY DAY AT 6 PM 90 tablet 3  . Biotin 5000 MCG CAPS Take 1 capsule by mouth daily. Takes daily     . GLUCOSAMINE SULFATE PO Take 2 capsules by mouth daily. Takes 2 daily     . Lutein 20 MG TABS Take 20 mg by mouth daily.     . metoprolol succinate (TOPROL-XL) 25 MG 24 hr tablet TAKE 1 TABLET(25 MG) BY MOUTH DAILY 90 tablet 3  . Multiple Vitamin (MULTIVITAMIN) tablet Take 2 tablets by mouth daily.    . Multiple Vitamins-Minerals (PRESERVISION AREDS 2 PO) Take 2 capsules by mouth daily.     . nitroGLYCERIN (NITROSTAT) 0.4 MG SL tablet Place 1 tablet (0.4 mg total) under the tongue every 5 (five) minutes x 3 doses as needed for chest pain. 25 tablet 2  .  Omega-3 1000 MG CAPS Take 2 capsules by mouth daily.     Marland Kitchen pyridOXINE (VITAMIN B-6) 100 MG tablet Take 100 mg by mouth daily.    . Coenzyme Q10 (CO Q 10) 100 MG CAPS Take 1 capsule by mouth daily.      No current facility-administered medications on file prior to visit.    Allergies  Allergen Reactions  . Epinephrine Other (See Comments)    unknown  . Latex     rash    Blood pressure (!) 142/86, pulse 70, height 5\' 4"  (1.626 m), weight 196 lb (88.9 kg), SpO2 99 %.  HYPERTENSION, BENIGN SYSTEMIC Blood pressure remains above desired goal of 130/80. Patient also reports drowsiness since increasing her lisinopril to 40mg  every morning and is concern about taking 3 mediation for BP d/t history of adverse reaction. She also admit dietary indiscretions and not monitoring sodium intake or alcohol intake.  Will change lisinopril to 20mg  BID to increase  tolerability. Patient is to monitor BP twice daily, work on positive lifestyle modifications especially decreasing sodium in her diet. Plan to follow up in 4 weeks and change lisinopril to Valsartan and/or metoprolol to carvedilol for addiotional BP control.    Shayden Gingrich Rodriguez-Guzman PharmD, BCPS, [place] [id number] 10/03/2019 11:32 AM

## 2019-10-03 ENCOUNTER — Encounter: Payer: Self-pay | Admitting: Pharmacist

## 2019-10-03 NOTE — Assessment & Plan Note (Signed)
Blood pressure remains above desired goal of 130/80. Patient also reports drowsiness since increasing her lisinopril to 40mg  every morning and is concern about taking 3 mediation for BP d/t history of adverse reaction. She also admit dietary indiscretions and not monitoring sodium intake or alcohol intake.  Will change lisinopril to 20mg  BID to increase tolerability. Patient is to monitor BP twice daily, work on positive lifestyle modifications especially decreasing sodium in her diet. Plan to follow up in 4 weeks and change lisinopril to Valsartan and/or metoprolol to carvedilol for addiotional BP control.

## 2019-10-18 ENCOUNTER — Other Ambulatory Visit: Payer: Self-pay | Admitting: Cardiology

## 2019-10-29 ENCOUNTER — Other Ambulatory Visit: Payer: Self-pay

## 2019-10-29 ENCOUNTER — Ambulatory Visit (INDEPENDENT_AMBULATORY_CARE_PROVIDER_SITE_OTHER): Payer: BC Managed Care – PPO | Admitting: Pharmacist

## 2019-10-29 VITALS — BP 146/100 | HR 76 | Ht 64.5 in | Wt 192.0 lb

## 2019-10-29 DIAGNOSIS — I10 Essential (primary) hypertension: Secondary | ICD-10-CM

## 2019-10-29 MED ORDER — AMLODIPINE BESYLATE 2.5 MG PO TABS: 2.5000 mg | ORAL_TABLET | Freq: Two times a day (BID) | ORAL | 1 refills | Status: DC

## 2019-10-29 NOTE — Progress Notes (Signed)
Patient ID: Carmen Brown                 DOB: 1940/08/23                      MRN: [id number]      HPI:  Carmen Brown is a 79 y.o. female referred by Dr. Antoine Poche to HTN clinic. PMH includes  CAD, hypertension, dyslipidemia.  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 Admits dietary indiscretions and lack of physical activity. Will prefer is we use less than 3 BP mediation for blood pressure management. She experienced severe fatigue and dizziness in the past while taking 3 medication for BP.  Denies dizziness, swelling, chest pain, or headaches. Had some drowsiness after lisinopril dose was increased, so I divided her lisinopril dose to 20mg  BID during last OV to increase tolerability.   Current HTN meds:  Lisinopril 20mg  twice daily Metoprolol succ 25mg  daily  Intolerance: unknown medication - severe fatigue and weakness  BP goal: <130/80  Family History: mother massive MI, father open heart surgery, stent in sister   Social History: denies tobacco products, occasional alcohol use  Diet: eats out sometimes, occasional caffeine., decreased sodium in diet  Exercise: activities of daily living  Home BP readings (Omron arm cuff - accurate within from manual reading) 14 readings: average 160/92 (range 138/98 to 182/110)  Wt Readings from Last 3 Encounters:  10/29/19 192 lb (87.1 kg)  10/01/19 196 lb (88.9 kg)  08/29/19 199 lb 4.8 oz (90.4 kg)   BP Readings from Last 3 Encounters:  10/29/19 (!) 146/100  10/01/19 (!) 142/86  08/29/19 (!) 156/90   Pulse Readings from Last 3 Encounters:  10/29/19 76  10/01/19 70  08/29/19 66    Past Medical History:  Diagnosis Date  . Allergy   . Arthritis   . CAD (coronary artery disease)    95% proximal LAD stenosis with DES.  Jan 2019  . Cardiomegaly   . Cardiomyopathy ([facility])    EF 35% in the distant past but NL EF 2009  . Hypertension, benign    systemic  . Menopausal syndrome    . Obesity   . Osteoarthritis of lower leg, localized   . Paroxysmal VT ([facility])   . Thyroid disease     Current Outpatient Medications on File Prior to Visit  Medication Sig Dispense Refill  . aspirin EC 81 MG tablet Take 81 mg by mouth daily.    08/31/19 atorvastatin (LIPITOR) 40 MG tablet TAKE 1 TABLET BY MOUTH EVERY DAY AT 6 PM 90 tablet 2  . Biotin 5000 MCG CAPS Take 1 capsule by mouth daily. Takes daily     . Coenzyme Q10 (CO Q 10) 100 MG CAPS Take 1 capsule by mouth daily.     . fluticasone (FLONASE) 50 MCG/ACT nasal spray Place 1 spray into both nostrils 2 (two) times daily. 16 mL 3  . GLUCOSAMINE SULFATE PO Take 2 capsules by mouth daily. Takes 2 daily     . lisinopril (ZESTRIL) 20 MG tablet Take 1 tablet (20 mg total) by mouth 2 (two) times daily. 60 tablet 3  . Lutein 20 MG TABS Take 20 mg by mouth daily.     . metoprolol succinate (TOPROL-XL) 25 MG 24 hr tablet TAKE 1 TABLET(25 MG) BY MOUTH DAILY 90 tablet 3  . Multiple Vitamin (MULTIVITAMIN) tablet Take 2 tablets by mouth daily.    Feb 2019  Multiple Vitamins-Minerals (PRESERVISION AREDS 2 PO) Take 2 capsules by mouth daily.     . nitroGLYCERIN (NITROSTAT) 0.4 MG SL tablet Place 1 tablet (0.4 mg total) under the tongue every 5 (five) minutes x 3 doses as needed for chest pain. 25 tablet 2  . Omega-3 1000 MG CAPS Take 2 capsules by mouth daily.     Marland Kitchen pyridOXINE (VITAMIN B-6) 100 MG tablet Take 100 mg by mouth daily.    . vitamin C (ASCORBIC ACID) 250 MG tablet Take 250 mg by mouth daily.     No current facility-administered medications on file prior to visit.    Allergies  Allergen Reactions  . Epinephrine Other (See Comments)    unknown  . Latex     rash    Blood pressure (!) 146/100, pulse 76, height 5' 4.5" (1.638 m), weight 192 lb (87.1 kg), SpO2 96 %.  HYPERTENSION, BENIGN SYSTEMIC Blood pressure remains above goal but patient now tolerating lisinopril twice daily.  Will add amlodipine 2.5mg  twice daily to current therapy and  follow up in 4-5 weeks.   Mike Hamre Rodriguez-Guzman PharmD, BCPS, [place] [id number] 11/06/2019 10:01 AM

## 2019-10-29 NOTE — Patient Instructions (Signed)
Return for a  follow up appointment in 4-5 weeks  Check your blood pressure at home daily (if able) and keep record of the readings.  Take your BP meds as follows: *START amlodipine 2.5mg  twice daily* *Continue all other medication as prescribed*  Bring all of your meds, your BP cuff and your record of home blood pressures to your next appointment.  Exercise as you're able, try to walk approximately 30 minutes per day.  Keep salt intake to a minimum, especially watch canned and prepared boxed foods.  Eat more fresh fruits and vegetables and fewer canned items.  Avoid eating in fast food restaurants.    HOW TO TAKE YOUR BLOOD PRESSURE: . Rest 5 minutes before taking your blood pressure. .  Don't smoke or drink caffeinated beverages for at least 30 minutes before. . Take your blood pressure before (not after) you eat. . Sit comfortably with your back supported and both feet on the floor (don't cross your legs). . Elevate your arm to heart level on a table or a desk. . Use the proper sized cuff. It should fit smoothly and snugly around your bare upper arm. There should be enough room to slip a fingertip under the cuff. The bottom edge of the cuff should be 1 inch above the crease of the elbow. . Ideally, take 3 measurements at one sitting and record the average.

## 2019-11-06 NOTE — Assessment & Plan Note (Signed)
Blood pressure remains above goal but patient now tolerating lisinopril twice daily.  Will add amlodipine 2.5mg  twice daily to current therapy and follow up in 4-5 weeks.

## 2019-11-25 DIAGNOSIS — R5383 Other fatigue: Secondary | ICD-10-CM | POA: Insufficient documentation

## 2019-11-25 DIAGNOSIS — R0602 Shortness of breath: Secondary | ICD-10-CM | POA: Insufficient documentation

## 2019-11-25 NOTE — Progress Notes (Signed)
Cardiology Office Note   Date:  11/27/2019   ID:  Brown, Carmen [birth year], MRN [id number]  PCP:  Myles Lipps, MD  Cardiologist:   Rollene Rotunda, MD   Chief Complaint  Patient presents with  . Coronary Artery Disease      History of Present Illness: Carmen Brown is a 79 y.o. female who presents for follow up of CAD.  She was admitted with [place] in Jan 2019.  She was found to have 95% LAD stenosis treated stent.  EF was normal.     Since I last saw her she still gets short of breath with activities.  She has not started doing a lot of exercise that she is doing some yard work.  She says she is sleeping better.  We talked about this last time she took melatonin a few times but she does not really needed.  She still describes decreased energy.  With activity she gets short of breath and she also describes feeling short of breath bending over.  However, she is not describing PND or orthopnea.  She is not describing chest pressure, neck or arm discomfort.  She has no new edema.  She has been working with our hypertension clinic and has had meds adjusted to include increase lisinopril and the addition of amlodipine and her blood pressure is better controlled.   Past Medical History:  Diagnosis Date  . Allergy   . Arthritis   . CAD (coronary artery disease)    95% proximal LAD stenosis with DES.  Jan 2019  . Cardiomegaly   . Cardiomyopathy (HCC)    EF 35% in the distant past but NL EF 2009  . Hypertension, benign    systemic  . Menopausal syndrome   . Obesity   . Osteoarthritis of lower leg, localized   . Paroxysmal VT (HCC)   . Thyroid disease     Past Surgical History:  Procedure Laterality Date  . CARPAL TUNNEL RELEASE Right 11/2016  . LEFT HEART CATH AND CORONARY ANGIOGRAPHY N/A 09/08/2017   Procedure: LEFT HEART CATH AND CORONARY ANGIOGRAPHY;  Surgeon: Yvonne Kendall, MD;  Location: [facility];  Service: Cardiovascular;  Laterality: N/A;  . MASS  EXCISION  03/15/2012   Procedure: MINOR EXCISION OF MASS;  Surgeon: Wyn Forster., MD;  Location: [facility];  Service: Orthopedics;  Laterality: Right;  debride IP right long finger, excision mucoid cyst  . ventricular tacticartia       Current Outpatient Medications  Medication Sig Dispense Refill  . amLODipine (NORVASC) 2.5 MG tablet Take 1 tablet (2.5 mg total) by mouth in the morning and at bedtime. 60 tablet 1  . aspirin EC 81 MG tablet Take 81 mg by mouth daily.    Marland Kitchen atorvastatin (LIPITOR) 40 MG tablet TAKE 1 TABLET BY MOUTH EVERY DAY AT 6 PM 90 tablet 2  . Biotin 5000 MCG CAPS Take 1 capsule by mouth daily. Takes daily     . fluticasone (FLONASE) 50 MCG/ACT nasal spray Place 1 spray into both nostrils 2 (two) times daily. 16 mL 3  . lisinopril (ZESTRIL) 20 MG tablet Take 1 tablet (20 mg total) by mouth 2 (two) times daily. 60 tablet 3  . metoprolol succinate (TOPROL-XL) 25 MG 24 hr tablet TAKE 1 TABLET(25 MG) BY MOUTH DAILY 90 tablet 3  . Multiple Vitamin (MULTIVITAMIN) tablet Take 1 tablet by mouth daily.     . nitroGLYCERIN (NITROSTAT) 0.4 MG  SL tablet Place 1 tablet (0.4 mg total) under the tongue every 5 (five) minutes x 3 doses as needed for chest pain. 25 tablet 2   No current facility-administered medications for this visit.    Allergies:   Epinephrine and Latex    ROS:  Please see the history of present illness.   Otherwise, review of systems are positive for none.   All other systems are reviewed and negative.    PHYSICAL EXAM: VS:  BP 118/84   Pulse 75   Temp (!) 96.9 F (36.1 C)   Ht 5' 4.5" (1.638 m)   Wt 193 lb 3.2 oz (87.6 kg)   SpO2 96%   BMI 32.65 kg/m  , BMI Body mass index is 32.65 kg/m.  GENERAL:  Well appearing NECK:  No jugular venous distention, waveform within normal limits, carotid upstroke brisk and symmetric, no bruits, no thyromegaly LUNGS:  Clear to auscultation bilaterally CHEST:  Unremarkable HEART:  PMI not displaced  or sustained,S1 and S2 within normal limits, no S3, no S4, no clicks, no rubs, no murmurs ABD:  Flat, positive bowel sounds normal in frequency in pitch, no bruits, no rebound, no guarding, no midline pulsatile mass, no hepatomegaly, no splenomegaly EXT:  2 plus pulses throughout, no edema, no cyanosis no clubbing   EKG:  EKG is not ordered today.   Recent Labs: 08/29/2019: ALT 23; BNP 36.3; Hemoglobin 13.7; Platelets 205; TSH 3.780 09/27/2019: BUN 14; Creatinine, Ser 0.85; Potassium 4.1; Sodium 144    Lipid Panel    Component Value Date/Time   CHOL 114 08/29/2019 1023   TRIG 127 08/29/2019 1023   HDL 46 08/29/2019 1023   CHOLHDL 2.5 08/29/2019 1023   CHOLHDL 3.9 09/08/2017 0259   VLDL 20 09/08/2017 0259   LDLCALC 46 08/29/2019 1023      Wt Readings from Last 3 Encounters:  11/27/19 193 lb 3.2 oz (87.6 kg)  10/29/19 192 lb (87.1 kg)  10/01/19 196 lb (88.9 kg)      Other studies Reviewed: Additional studies/ records that were reviewed today include:   Labs Review of the above records demonstrates:  See elsewhere   ASSESSMENT AND PLAN:   CAD:   She does have dyspnea which will be addressed as below.  HTN:  The blood pressure is at target.  She will continue the meds as listed.   DYSLIPIDEMIA:   LDL was 46 with an HDL of 46.  No change in therapy. .   DYSPNEA:   She continues to complain of this and so I will bring her back for a POET (Plain Old Exercise Treadmill).  This may well be weight and deconditioning and if her stress test is negative she needs to start on a walking regimen and see if she does not improve.   FATIGUE:    I do not see a clear cardiac etiology to this.  She has had a TSH that was normal this year.  She is not anemic.  No change in therapy.    COVID EDUCATION: She has been vaccinated.  Current medicines are reviewed at length with the patient today.  The patient does not have concerns regarding medicines.  The following changes have been made:   None  Labs/ tests ordered today include: None  Orders Placed This Encounter  Procedures  . EXERCISE TOLERANCE TEST (ETT)     Disposition:   FU with me in 6 months.   Signed, Minus Breeding, MD  11/27/2019 10:28 AM  [facility]

## 2019-11-27 ENCOUNTER — Other Ambulatory Visit: Payer: Self-pay

## 2019-11-27 ENCOUNTER — Encounter: Payer: Self-pay | Admitting: Cardiology

## 2019-11-27 ENCOUNTER — Ambulatory Visit (INDEPENDENT_AMBULATORY_CARE_PROVIDER_SITE_OTHER): Payer: BC Managed Care – PPO | Admitting: Cardiology

## 2019-11-27 VITALS — BP 118/84 | HR 75 | Temp 96.9°F | Ht 64.5 in | Wt 193.2 lb

## 2019-11-27 DIAGNOSIS — I2510 Atherosclerotic heart disease of native coronary artery without angina pectoris: Secondary | ICD-10-CM

## 2019-11-27 DIAGNOSIS — Z7189 Other specified counseling: Secondary | ICD-10-CM

## 2019-11-27 DIAGNOSIS — R5383 Other fatigue: Secondary | ICD-10-CM

## 2019-11-27 DIAGNOSIS — R0602 Shortness of breath: Secondary | ICD-10-CM

## 2019-11-27 DIAGNOSIS — I10 Essential (primary) hypertension: Secondary | ICD-10-CM | POA: Diagnosis not present

## 2019-11-27 DIAGNOSIS — E785 Hyperlipidemia, unspecified: Secondary | ICD-10-CM | POA: Diagnosis not present

## 2019-11-27 NOTE — Patient Instructions (Signed)
Medication Instructions:  NO CHANGES *If you need a refill on your cardiac medications before your next appointment, please call your pharmacy*  Lab Work: NONE ORDERED THIS VISIT  Testing/Procedures:  YOU WILL NEED A Covid Screening 3 DAYS BEFORE YOUR STRESS TEST. This is a Drive Up Visit at the [facility] [address] [place]. Someone will direct you to the appropriate testing line. Stay in your car and someone will be with you shortly  Your physician has requested that you have an exercise tolerance test. For further information please visit [email or web address] Please also follow instruction sheet, as given.  Follow-Up: At [facility], you and your health needs are our priority.  As part of our continuing mission to provide you with exceptional heart care, we have created designated Provider Care Teams.  These Care Teams include your primary Cardiologist (physician) and Advanced Practice Providers (APPs -  Physician Assistants and Nurse Practitioners) who all work together to provide you with the care you need, when you need it.  Your next appointment:   6 month(s) You will receive a reminder letter in the mail two months in advance. If you don't receive a letter, please call our office to schedule the follow-up appointment.  The format for your next appointment:   In Person  Provider:   Rollene Rotunda, MD

## 2019-12-05 ENCOUNTER — Other Ambulatory Visit: Payer: Self-pay

## 2019-12-05 MED ORDER — LISINOPRIL 20 MG PO TABS: 20.0000 mg | ORAL_TABLET | Freq: Two times a day (BID) | ORAL | 3 refills | Status: DC

## 2019-12-06 ENCOUNTER — Encounter (HOSPITAL_COMMUNITY): Payer: BC Managed Care – PPO

## 2019-12-26 ENCOUNTER — Other Ambulatory Visit: Payer: Self-pay

## 2019-12-26 ENCOUNTER — Encounter: Payer: Self-pay | Admitting: Family Medicine

## 2019-12-26 ENCOUNTER — Ambulatory Visit: Payer: BC Managed Care – PPO | Admitting: Family Medicine

## 2019-12-26 ENCOUNTER — Other Ambulatory Visit: Payer: Self-pay | Admitting: Cardiology

## 2019-12-26 VITALS — BP 135/88 | HR 83 | Temp 98.4°F | Resp 15 | Ht 64.5 in | Wt 192.0 lb

## 2019-12-26 DIAGNOSIS — M545 Low back pain, unspecified: Secondary | ICD-10-CM

## 2019-12-26 DIAGNOSIS — M1A00X0 Idiopathic chronic gout, unspecified site, without tophus (tophi): Secondary | ICD-10-CM

## 2019-12-26 DIAGNOSIS — M109 Gout, unspecified: Secondary | ICD-10-CM

## 2019-12-26 LAB — POCT URINALYSIS DIP (MANUAL ENTRY)
Bilirubin, UA: NEGATIVE
Glucose, UA: NEGATIVE mg/dL
Ketones, POC UA: NEGATIVE mg/dL
Leukocytes, UA: NEGATIVE
Nitrite, UA: NEGATIVE
Protein Ur, POC: NEGATIVE mg/dL
Spec Grav, UA: >=1.03 — AB (ref 1.010–1.025)
Urobilinogen, UA: 0.2 E.U./dL
pH, UA: 5 (ref 5.0–8.0)

## 2019-12-26 MED ORDER — COLCHICINE 0.6 MG PO TABS: ORAL_TABLET | ORAL | 1 refills | Status: DC

## 2019-12-26 MED ORDER — PREDNISONE 20 MG PO TABS: ORAL_TABLET | ORAL | 0 refills | Status: DC

## 2019-12-26 NOTE — Progress Notes (Signed)
Patient ID: Carmen Brown, female    DOB: 1941/02/09  Age: 79 y.o. MRN: [id number]  Chief Complaint  Patient presents with  . Gout    pt has had a long history of gout issues, pt always gets this in her RT foot and is now begining in her Lt as well states she miss stepped the other day and felt like she broke her toe, this flare started about 6 weeks ago. Pt is also concerned it is affecting her kidneys now as she has had some lower back soreness for about 3 weeks     Subjective:   79 year old lady who comes in with problems with pain in her foot.  She has had a history of gout, with intermittent flares.  A week ago she had been having some gout, but then had sudden acute pain in her right fifth toe that she had twisted and thought she might of broken some.  She decided just to live with that.  However it has continued to hurt her some.  Then during the night last night the right large toe bunion started having acute pain.  Her feet seem to be the source of most of her gout problems.  She has had a uric acid level checked in the past but I do not have record of that.  She has gone to an urgent care elsewhere at times for treatments and has received colchicine.  Last time she was treated with prednisone and a steroid shot.  Current allergies, medications, problem list, past/family and social histories reviewed.  Objective:  BP 135/88   Pulse 83   Temp 98.4 F (36.9 C) (Temporal)   Resp 15   Ht 5' 4.5" (1.638 m)   Wt 192 lb (87.1 kg)   SpO2 96%   BMI 32.45 kg/m   No major distress.  She also reported having some low back pain.  Her back is mildly tender in the lower paraspinous muscles but she has good range of motion.  Feet have large bunions, the right bunion is tender.  The fifth toe joint is only mildly tender right now.  Assessment & Plan:   Assessment: 1. Acute gouty arthritis   2. Idiopathic chronic gout without tophus, unspecified site   3. Low back pain without sciatica,  unspecified back pain laterality, unspecified chronicity       Plan: See instructions.  Orders Placed This Encounter  Procedures  . Basic metabolic panel  . Uric acid  . POCT urinalysis dipstick    Meds ordered this encounter  Medications  . predniSONE (DELTASONE) 20 MG tablet    Sig: Take 3 daily for 2 days, then 2 daily for 2 days, then 1 daily for 2 days, then one half daily if needed    Dispense:  14 tablet    Refill:  0  . colchicine 0.6 MG tablet    Sig: Take 2 initially, the repeat one pill in several hours.  Then one daily.    Dispense:  30 tablet    Refill:  1         Patient Instructions    Take prednisone 3 pills daily for 2 days, then 2 daily for 2 days, then 1 daily for 2 days, then one half daily as needed.  If you are doing well, you can stop sooner and keep the remaining pills for future use.  Colchicine is to hang onto for future use if needed  If you take the colchicine you  should cut the cholesterol pills to every other day while you are on the colchicine.  We will let you know if your uric acid level is running high, and we might need to place you on a long-term uric acid lowering medication called allopurinol.  Return as needed   If you have lab work done today you will be contacted with your lab results within the next 2 weeks.  If you have not heard from [place] then please contact us. The fastest way to get your results is to register for My Chart.   IF you received an x-ray today, you will receive an invoice from [facility]. Please contact [facility] at [phone] with questions or concerns regarding your invoice.   IF you received labwork today, you will receive an invoice from [place]. Please contact [facility] at [phone] with questions or concerns regarding your invoice.   Our billing staff will not be able to assist you with questions regarding bills from these companies.  You will be contacted with the lab  results as soon as they are available. The fastest way to get your results is to activate your My Chart account. Instructions are located on the last page of this paperwork. If you have not heard from [place] regarding the results in 2 weeks, please contact this office.      Urine was normal. Uric acid pending.  Return if symptoms worsen or fail to improve.   Ruben Reason, MD 12/26/2019

## 2019-12-26 NOTE — Patient Instructions (Addendum)
  Take prednisone 3 pills daily for 2 days, then 2 daily for 2 days, then 1 daily for 2 days, then one half daily as needed.  If you are doing well, you can stop sooner and keep the remaining pills for future use.  Colchicine is to hang onto for future use if needed  If you take the colchicine you should cut the cholesterol pills to every other day while you are on the colchicine.  We will let you know if your uric acid level is running high, and we might need to place you on a long-term uric acid lowering medication called allopurinol.  Return as needed   If you have lab work done today you will be contacted with your lab results within the next 2 weeks.  If you have not heard from [place] then please contact us. The fastest way to get your results is to register for My Chart.   IF you received an x-ray today, you will receive an invoice from [facility]. Please contact [facility]iology at [phone] with questions or concerns regarding your invoice.   IF you received labwork today, you will receive an invoice from [facility]. Please contact [facility] at [phone] with questions or concerns regarding your invoice.   Our billing staff will not be able to assist you with questions regarding bills from these companies.  You will be contacted with the lab results as soon as they are available. The fastest way to get your results is to activate your My Chart account. Instructions are located on the last page of this paperwork. If you have not heard from [place] regarding the results in 2 weeks, please contact this office.

## 2019-12-27 ENCOUNTER — Other Ambulatory Visit: Payer: Self-pay | Admitting: Family Medicine

## 2019-12-27 DIAGNOSIS — M109 Gout, unspecified: Secondary | ICD-10-CM

## 2019-12-27 DIAGNOSIS — E790 Hyperuricemia without signs of inflammatory arthritis and tophaceous disease: Secondary | ICD-10-CM

## 2019-12-27 LAB — BASIC METABOLIC PANEL
BUN/Creatinine Ratio: 17 (ref 12–28)
BUN: 16 mg/dL (ref 8–27)
CO2: 23 mmol/L (ref 20–29)
Calcium: 9.6 mg/dL (ref 8.7–10.3)
Chloride: 103 mmol/L (ref 96–106)
Creatinine, Ser: 0.93 mg/dL (ref 0.57–1.00)
GFR calc Af Amer: 69 mL/min/{1.73_m2} (ref >59)
GFR calc non Af Amer: 59 mL/min/{1.73_m2} — ABNORMAL LOW (ref >59)
Glucose: 90 mg/dL (ref 65–99)
Potassium: 4.7 mmol/L (ref 3.5–5.2)
Sodium: 141 mmol/L (ref 134–144)

## 2019-12-27 LAB — URIC ACID: Uric Acid: 8 mg/dL — ABNORMAL HIGH (ref 3.1–7.9)

## 2019-12-27 MED ORDER — ALLOPURINOL 100 MG PO TABS: 100.0000 mg | ORAL_TABLET | Freq: Every day | ORAL | 6 refills | Status: DC

## 2019-12-27 NOTE — Progress Notes (Signed)
Uric acid level is a little high at 8.0.  Normal is 3.1 to 7.9, and in people with recurrent gout it should be down less than about 6.5.  With your history of recurrences of gout, I think we should go ahead and put you on a low-dose of allopurinol 100 mg once daily.  I'm sending that prescription into your pharmacy.  Return in about 2 or 3 months to get your labs rechecked.  Sooner if problems.

## 2019-12-30 ENCOUNTER — Telehealth: Payer: Self-pay | Admitting: Cardiology

## 2019-12-30 MED ORDER — AMLODIPINE BESYLATE 2.5 MG PO TABS
ORAL_TABLET | ORAL | 3 refills | Status: DC
Start: 2019-12-30 — End: 2020-05-22

## 2019-12-30 NOTE — Telephone Encounter (Signed)
OK 

## 2019-12-30 NOTE — Telephone Encounter (Signed)
Returned call to patient she stated she has been taking Amlodipine 2.5 mg twice a day.Stated refill was sent in for 30 tablets.After reviewing chart Amlodipine was listed 2.5 mg daily.Advised I will make the correction in her chart.I will send in a 90 day supply.She also wanted to make Dr.Hochrein aware she was prescribed Colchicine prn for gout flare ups.She was told when she takes Colchicine reduce Atorvastatin to every other night.Advised I will make Dr.Hochrein aware.

## 2019-12-30 NOTE — Telephone Encounter (Signed)
Pt c/o medication issue:  1. Name of Medication: amLODipine (NORVASC) 2.5 MG tablet  2. How are you currently taking this medication (dosage and times per day)? 2x daily  3. Are you having a reaction (difficulty breathing--STAT)? no  4. What is your medication issue? Patient states that Dr. Antoine Poche told her to take this medication 2x daily. Whenever she got the medication refilled the instructions were only for 1x daily. She has continued to do 2x daily because she was never told to decrease the amount of times she takes the medication. She wants to make sure this is okay.   Pt c/o medication issue:  1. Name of Medication: atorvastatin (LIPITOR) 40 MG tablet and colchicine 0.6 MG tablet  2. How are you currently taking this medication (dosage and times per day)? Atorvastatin as directed and colchicine she has not started.   3. Are you having a reaction (difficulty breathing--STAT)? no  4. What is your medication issue? Patient has flare ups with gout that usually happen on the weekend. She was prescribed Colchicine to help with the flare ups. If she decides to take the medication, she was told she would need to start taking atorvastatin every other day. She wants to know what Dr. Antoine Poche thinks.

## 2020-01-13 ENCOUNTER — Other Ambulatory Visit: Payer: Self-pay | Admitting: Cardiology

## 2020-01-13 DIAGNOSIS — I10 Essential (primary) hypertension: Secondary | ICD-10-CM

## 2020-01-30 ENCOUNTER — Other Ambulatory Visit: Payer: Self-pay | Admitting: Cardiology

## 2020-02-21 ENCOUNTER — Telehealth (HOSPITAL_COMMUNITY): Payer: Self-pay

## 2020-02-21 NOTE — Telephone Encounter (Signed)
Encounter complete. 

## 2020-02-26 ENCOUNTER — Ambulatory Visit (HOSPITAL_COMMUNITY)
Admission: RE | Admit: 2020-02-26 | Payer: BC Managed Care – PPO | Source: Ambulatory Visit | Attending: Cardiology | Admitting: Cardiology

## 2020-02-28 ENCOUNTER — Encounter: Payer: Self-pay | Admitting: Family Medicine

## 2020-02-28 ENCOUNTER — Other Ambulatory Visit: Payer: Self-pay

## 2020-02-28 ENCOUNTER — Ambulatory Visit: Payer: BC Managed Care – PPO | Admitting: Family Medicine

## 2020-02-28 DIAGNOSIS — M109 Gout, unspecified: Secondary | ICD-10-CM | POA: Diagnosis not present

## 2020-02-28 DIAGNOSIS — E790 Hyperuricemia without signs of inflammatory arthritis and tophaceous disease: Secondary | ICD-10-CM

## 2020-02-28 MED ORDER — PREDNISONE 20 MG PO TABS: ORAL_TABLET | ORAL | 0 refills | Status: DC

## 2020-02-28 MED ORDER — ALLOPURINOL 300 MG PO TABS: 300.0000 mg | ORAL_TABLET | Freq: Every day | ORAL | 2 refills | Status: DC

## 2020-02-28 MED ORDER — COLCHICINE 0.6 MG PO TABS: ORAL_TABLET | ORAL | 1 refills | Status: DC

## 2020-02-28 MED ORDER — ALLOPURINOL 100 MG PO TABS: 100.0000 mg | ORAL_TABLET | Freq: Every day | ORAL | 6 refills | Status: DC

## 2020-02-28 NOTE — Patient Instructions (Addendum)
  Increase allopurinol to 300 mg daily beginning in a few days when your gout has come down.  Take over-the-counter ibuprofen 200 mg 3 pills 3 times daily with some food as needed for pain and inflammation  Continue using the colchicine.  You can take it twice a day for a couple of days if needed.  Take prednisone 20 mg 3 pills daily for 2 days, then 2 daily for 2 days, then 1 daily for 2 days, then one half daily until gone.  Return in about 3 months for a recheck of the labs and gout.  Review the list of foods that can cause gout which you can Google on the Internet.  Just look and see if there is anything that you can think of that may be helping trigger your attacks.   If you have lab work done today you will be contacted with your lab results within the next 2 weeks.  If you have not heard from [place] then please contact us. The fastest way to get your results is to register for My Chart.   IF you received an x-ray today, you will receive an invoice from [facility]. Please contact [facility] at [phone] with questions or concerns regarding your invoice.   IF you received labwork today, you will receive an invoice from [facility]. Please contact [facility] at [phone] with questions or concerns regarding your invoice.   Our billing staff will not be able to assist you with questions regarding bills from these companies.  You will be contacted with the lab results as soon as they are available. The fastest way to get your results is to activate your My Chart account. Instructions are located on the last page of this paperwork. If you have not heard from [place] regarding the results in 2 weeks, please contact this office.

## 2020-02-28 NOTE — Progress Notes (Signed)
Patient ID: Carmen Brown, female    DOB: 02/18/1941  Age: 79 y.o. MRN: [id number]  Chief Complaint  Patient presents with  . Toe Pain    Pt stated that she has been having some Rt toe pain x3 days. She did not hurt her big toe     Subjective:   Patient has again had a bad flare of gout the last few days.  She has had to take some hydrocodone pills that she had it was hurting so badly.  It is the same swelling of the right foot bunion.  Recently she did have some pain in the right ankle also.  She does not know what triggered this.  Current allergies, medications, problem list, past/family and social histories reviewed.  Objective:  BP 128/83 (BP Location: Right Arm, Patient Position: Sitting, Cuff Size: Normal)   Pulse 87   Temp 98 F (36.7 C) (Temporal)   Ht 5' 4.5" (1.638 m)   Wt 189 lb 3.2 oz (85.8 kg)   SpO2 95%   BMI 31.97 kg/m   Obviously painful and red bunion of the right large toe with erythema extending across the foot halfway.  Very tender to touch.  Fluctuance palpable in the joint. Assessment & Plan:   Assessment: 1. Acute gouty arthritis   2. Hyperuricemia       Plan: Classic appearing gout.  See instructions.  Whoever sees her when she returns should consider getting another set of liver enzymes to make sure she is tolerating the allopurinol well.  Orders Placed This Encounter  Procedures  . CMP14+EGFR  . Uric acid    Meds ordered this encounter  Medications  . predniSONE (DELTASONE) 20 MG tablet    Sig: Take 3 daily for 2 days, 2 daily for 2 days, 1 daily for 2 days, then 1/2 daily for 4 days    Dispense:  14 tablet    Refill:  0  . colchicine 0.6 MG tablet    Sig: Take 2 initially, the repeat one pill in several hours.  Then one daily.    Dispense:  30 tablet    Refill:  1  . DISCONTD: allopurinol (ZYLOPRIM) 100 MG tablet    Sig: Take 1 tablet (100 mg total) by mouth daily.    Dispense:  30 tablet    Refill:  6  . allopurinol (ZYLOPRIM) 300  MG tablet    Sig: Take 1 tablet (300 mg total) by mouth daily.    Dispense:  90 tablet    Refill:  2    Please disregard the prescription for allopurinol 100 mg.  She is to take allopurinol 300 mg from now on.         Patient Instructions    Increase allopurinol to 300 mg daily beginning in a few days when your gout has come down.  Take over-the-counter ibuprofen 200 mg 3 pills 3 times daily with some food as needed for pain and inflammation  Continue using the colchicine.  You can take it twice a day for a couple of days if needed.  Take prednisone 20 mg 3 pills daily for 2 days, then 2 daily for 2 days, then 1 daily for 2 days, then one half daily until gone.  Return in about 3 months for a recheck of the labs and gout.  Review the list of foods that can cause gout which you can Google on the Internet.  Just look and see if there is anything that you  can think of that may be helping trigger your attacks.   If you have lab work done today you will be contacted with your lab results within the next 2 weeks.  If you have not heard from [place] then please contact us. The fastest way to get your results is to register for My Chart.   IF you received an x-ray today, you will receive an invoice from [facility]. Please contact [facility]iology at [phone] with questions or concerns regarding your invoice.   IF you received labwork today, you will receive an invoice from [place]. Please contact [facility] at [phone] with questions or concerns regarding your invoice.   Our billing staff will not be able to assist you with questions regarding bills from these companies.  You will be contacted with the lab results as soon as they are available. The fastest way to get your results is to activate your My Chart account. Instructions are located on the last page of this paperwork. If you have not heard from [place] regarding the results in 2 weeks, please contact this office.         No follow-ups on file.   Ruben Reason, MD 02/28/2020

## 2020-02-29 LAB — CMP14+EGFR
ALT: 24 IU/L (ref 0–32)
AST: 21 IU/L (ref 0–40)
Albumin/Globulin Ratio: 1.7 (ref 1.2–2.2)
Albumin: 4.4 g/dL (ref 3.7–4.7)
Alkaline Phosphatase: 94 IU/L (ref 48–121)
BUN/Creatinine Ratio: 14 (ref 12–28)
BUN: 13 mg/dL (ref 8–27)
Bilirubin Total: 0.6 mg/dL (ref 0.0–1.2)
CO2: 25 mmol/L (ref 20–29)
Calcium: 9.6 mg/dL (ref 8.7–10.3)
Chloride: 103 mmol/L (ref 96–106)
Creatinine, Ser: 0.91 mg/dL (ref 0.57–1.00)
GFR calc Af Amer: 70 mL/min/{1.73_m2} (ref >59)
GFR calc non Af Amer: 61 mL/min/{1.73_m2} (ref >59)
Globulin, Total: 2.6 g/dL (ref 1.5–4.5)
Glucose: 104 mg/dL — ABNORMAL HIGH (ref 65–99)
Potassium: 4.7 mmol/L (ref 3.5–5.2)
Sodium: 142 mmol/L (ref 134–144)
Total Protein: 7 g/dL (ref 6.0–8.5)

## 2020-02-29 LAB — URIC ACID: Uric Acid: 6.6 mg/dL (ref 3.1–7.9)

## 2020-03-27 NOTE — Progress Notes (Signed)
Labs from last month were normal.  I apologize for not getting back sooner.  [place].Carmen Brown. Alwyn Ren, MD

## 2020-04-03 ENCOUNTER — Telehealth (HOSPITAL_COMMUNITY): Payer: Self-pay

## 2020-04-03 NOTE — Telephone Encounter (Signed)
Encounter complete. 

## 2020-04-08 ENCOUNTER — Ambulatory Visit (HOSPITAL_COMMUNITY)
Admission: RE | Admit: 2020-04-08 | Discharge: 2020-04-08 | Disposition: A | Discharge status: Home / Self Care | Payer: BC Managed Care – PPO | Source: Ambulatory Visit | Attending: Internal Medicine | Admitting: Internal Medicine

## 2020-04-08 ENCOUNTER — Other Ambulatory Visit: Payer: Self-pay

## 2020-04-08 DIAGNOSIS — R0602 Shortness of breath: Secondary | ICD-10-CM | POA: Insufficient documentation

## 2020-04-08 LAB — EXERCISE TOLERANCE TEST
Estimated workload: 5 METS
Exercise duration (min): 3 min
Exercise duration (sec): 22 s
MPHR: 143 {beats}/min
Peak HR: 141 {beats}/min
Percent HR: 98 %
Rest HR: 79 {beats}/min

## 2020-04-23 ENCOUNTER — Ambulatory Visit: Payer: BC Managed Care – PPO | Admitting: Family Medicine

## 2020-04-23 ENCOUNTER — Other Ambulatory Visit: Payer: Self-pay

## 2020-04-23 ENCOUNTER — Encounter: Payer: Self-pay | Admitting: Family Medicine

## 2020-04-23 VITALS — BP 129/79 | HR 82 | Temp 98.2°F | Ht 64.6 in | Wt 197.6 lb

## 2020-04-23 DIAGNOSIS — E790 Hyperuricemia without signs of inflammatory arthritis and tophaceous disease: Secondary | ICD-10-CM | POA: Diagnosis not present

## 2020-04-23 DIAGNOSIS — M109 Gout, unspecified: Secondary | ICD-10-CM

## 2020-04-23 NOTE — Progress Notes (Signed)
9/9/20214:13 PM  McCord 23-Feb-1941, 79 y.o., female [id number]  Chief Complaint  Patient presents with  . Gout    right foot, asking for steroid shot if given here or a referral for it can be done. [facility], did not need since pain wad better after taking 3 colchicine and given a steroid shot. Also drinking tart cherry juice    HPI:   Patient is a 79 y.o. female with past medical history significant for gout who presents today for flare up  Seen February 28 2020 by Dr Linna Darner - increased allopurinol, started colchicine and pred taper.   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 0 0 0 No No  Number falls in past yr: 0 0 - - -  Injury with Fall? 0 0 - - -  Comment - - - - -  Follow up - Falls evaluation completed Falls evaluation completed - -     Allergies  Allergen Reactions  . Epinephrine Other (See Comments)    unknown  . Latex     rash    Prior to Admission medications   Medication Sig Start Date End Date  Taking? Authorizing Provider  allopurinol (ZYLOPRIM) 300 MG tablet Take 1 tablet (300 mg total) by mouth daily. 02/28/20  Yes Posey Boyer, MD  amLODipine (NORVASC) 2.5 MG tablet Take 2.5 mg twice a day 12/30/19  Yes Minus Breeding, MD  aspirin EC 81 MG tablet Take 81 mg by mouth daily.   Yes [provider]  atorvastatin (LIPITOR) 40 MG tablet TAKE 1 TABLET BY MOUTH EVERY DAY AT 6 PM 10/18/19  Yes Minus Breeding, MD  Biotin 5000 MCG CAPS Take 1 capsule by mouth daily. Takes daily    Yes [provider]  colchicine 0.6 MG tablet Take 2 initially, the repeat one pill in several hours.  Then one daily. 02/28/20  Yes Posey Boyer, MD  fluticasone (FLONASE) 50 MCG/ACT nasal spray Place 1 spray into both nostrils 2 (two) times daily. 10/01/19  Yes Minus Breeding, MD  metoprolol succinate (TOPROL-XL) 25 MG 24 hr tablet TAKE 1 TABLET(25 MG) BY MOUTH DAILY 01/14/20  Yes Minus Breeding, MD  Multiple Vitamin (MULTIVITAMIN) tablet Take 1 tablet by mouth daily.    Yes [provider]  nitroGLYCERIN (NITROSTAT) 0.4 MG SL tablet Place 1 tablet (0.4 mg total) under the tongue  every 5 (five) minutes x 3 doses as needed for chest pain. 09/09/17  Yes Strader, [place] M, PA-C  lisinopril (ZESTRIL) 20 MG tablet Take 1 tablet (20 mg total) by mouth 2 (two) times daily. 12/05/19 03/04/20  Rodriguez-Guzman, Raquel, RPH-CPP  predniSONE (DELTASONE) 20 MG tablet Take 3 daily for 2 days, 2 daily for 2 days, 1 daily for 2 days, then 1/2 daily for 4 days Patient not taking: Reported on 04/23/2020 02/28/20   Posey Boyer, MD  Zoster Vaccine Adjuvanted [facility]) injection Shingrix (PF) 50 mcg/0.5 mL intramuscular suspension, kit    [provider]    Past Medical History:  Diagnosis Date  . Allergy   . Arthritis   . CAD (coronary artery disease)    95% proximal LAD stenosis with DES.  Jan 2019  . Cardiomegaly   . Cardiomyopathy ([place])    EF 35% in the distant past but NL EF 2009  .  Hypertension, benign    systemic  . Menopausal syndrome   . Obesity   . Osteoarthritis of lower leg, localized   . Paroxysmal VT ([place])   . Thyroid disease     Past Surgical History:  Procedure Laterality Date  . CARPAL TUNNEL RELEASE Right 11/2016  . LEFT HEART CATH AND CORONARY ANGIOGRAPHY N/A 09/08/2017   Procedure: LEFT HEART CATH AND CORONARY ANGIOGRAPHY;  Surgeon: Nelva Bush, MD;  Location: [facility];  Service: Cardiovascular;  Laterality: N/A;  . MASS EXCISION  03/15/2012   Procedure: MINOR EXCISION OF MASS;  Surgeon: Cammie Sickle., MD;  Location: [facility];  Service: Orthopedics;  Laterality: Right;  debride IP right long finger, excision mucoid cyst  . ventricular tacticartia      Social History   Tobacco Use  . Smoking status: Never Smoker  . Smokeless tobacco: Never Used  Substance Use Topics  . Alcohol use: Not Currently    Family History  Problem Relation Age of Onset  . Heart disease Mother 71       MI  . Heart disease Father 96       CABG  . CAD Sister 82       Stent  . Lymphoma Sister   . Stroke Maternal Grandmother   . Diabetes Maternal Grandfather   . Diabetes Maternal Uncle     ROS Per hpi  OBJECTIVE:  Today's Vitals   04/23/20 1609  BP: 129/79  Pulse: 82  Temp: 98.2 F (36.8 C)  SpO2: 97%  Weight: 197 lb 9.6 oz (89.6 kg)  Height: 5' 4.6" (1.641 m)   Body mass index is 33.29 kg/m.  Wt Readings from Last 3 Encounters:  04/23/20 197 lb 9.6 oz (89.6 kg)  02/28/20 189 lb 3.2 oz (85.8 kg)  12/26/19 192 lb (87.1 kg)    Physical Exam Vitals and nursing note reviewed.  Constitutional:      Appearance: She is well-developed.  HENT:     Head: Normocephalic and atraumatic.  Eyes:     General: No scleral icterus.    Conjunctiva/sclera: Conjunctivae normal.     Pupils: Pupils are equal, round, and reactive to light.  Pulmonary:     Effort: Pulmonary effort is normal.  Musculoskeletal:     Cervical  back: Neck supple.  Skin:    General: Skin is warm and dry.  Neurological:     Mental Status: She is alert and oriented to person, place, and time.     No results found for  this or any previous visit (from the past 24 hour(s)).  No results found.   ASSESSMENT and PLAN  1. Acute gouty arthritis 2. Hyperuricemia Better today after treatment given at [facility]. Referring to rheum as requested, again reviewed LFM.  - Ambulatory referral to Rheumatology - Uric Acid; Future - Basic Metabolic Panel; Future  Other orders - Zoster Vaccine Adjuvanted [facility]) injection; Shingrix (PF) 50 mcg/0.5 mL intramuscular suspension, kit  No follow-ups on file.    Rutherford Guys, MD Primary Care at [facility] [place], [place] [address] Ph.  [phone] Fax [phone]

## 2020-04-23 NOTE — Patient Instructions (Signed)
° ° ° °  If you have lab work done today you will be contacted with your lab results within the next 2 weeks.  If you have not heard from us then please contact us. The fastest way to get your results is to register for My Chart. ° ° °IF you received an x-ray today, you will receive an invoice from [facility]. Please contact [facility] at [phone] with questions or concerns regarding your invoice.  ° °IF you received labwork today, you will receive an invoice from [facility]. Please contact [facility] at [phone] with questions or concerns regarding your invoice.  ° °Our billing staff will not be able to assist you with questions regarding bills from these companies. ° °You will be contacted with the lab results as soon as they are available. The fastest way to get your results is to activate your My Chart account. Instructions are located on the last page of this paperwork. If you have not heard from us regarding the results in 2 weeks, please contact this office. °  ° ° ° °

## 2020-04-29 ENCOUNTER — Ambulatory Visit: Payer: BC Managed Care – PPO

## 2020-04-29 ENCOUNTER — Other Ambulatory Visit: Payer: Self-pay

## 2020-04-29 DIAGNOSIS — E790 Hyperuricemia without signs of inflammatory arthritis and tophaceous disease: Secondary | ICD-10-CM

## 2020-04-30 LAB — BASIC METABOLIC PANEL
BUN/Creatinine Ratio: 15 (ref 12–28)
BUN: 13 mg/dL (ref 8–27)
CO2: 25 mmol/L (ref 20–29)
Calcium: 9.5 mg/dL (ref 8.7–10.3)
Chloride: 102 mmol/L (ref 96–106)
Creatinine, Ser: 0.87 mg/dL (ref 0.57–1.00)
GFR calc Af Amer: 74 mL/min/{1.73_m2} (ref >59)
GFR calc non Af Amer: 64 mL/min/{1.73_m2} (ref >59)
Glucose: 78 mg/dL (ref 65–99)
Potassium: 4.6 mmol/L (ref 3.5–5.2)
Sodium: 140 mmol/L (ref 134–144)

## 2020-04-30 LAB — URIC ACID: Uric Acid: 4.3 mg/dL (ref 3.1–7.9)

## 2020-05-20 NOTE — Progress Notes (Signed)
Office Visit Note  Patient: Carmen Brown             Date of Birth: September 29, 1940           MRN: [id number]             PCP: Myles Lipps, MD Referring: Myles Lipps, MD Visit Date: 05/21/2020 Occupation: Flight attendant  Subjective:  Gout (patient has experienced gout attacks 10-12 times since May 2021) and Medication Management (patient is taking Allopurinol and Colchicine)   History of Present Illness: Carmen Brown is a 80 y.o. female referred here for gout. States gout attack started about 2 years ago with once per year frequency but in the past six months had more than 10 attacks. These all occur in the MTP joint of the left and right foot and once in the medial midfoot. She takes colchicine as needed to resolve the symptoms which do go away after several days fully resolved between episodes. She has been started on urate lowering therapy currently on allopurinol 300 mg daily. This was associated with reduction in uric acid from 8.0 down to 4.3 in the past 4 months. 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   Activities of Daily Living:  Patient reports morning stiffness for 5-60 minutes.   Patient Denies nocturnal pain.  Difficulty dressing/grooming: Denies Difficulty climbing stairs: Reports Difficulty getting out of chair: Reports Difficulty using hands for taps, buttons, cutlery, and/or writing: Denies  Review of Systems  Constitutional: Negative for fatigue.  HENT: Negative for mouth sores, mouth dryness and nose dryness.   Eyes: Positive for dryness. Negative for pain, itching and visual disturbance.  Respiratory: Negative for cough, hemoptysis, shortness of breath and difficulty breathing.   Cardiovascular: Negative for chest pain, palpitations and swelling in legs/feet.  Gastrointestinal: Positive for diarrhea. Negative for abdominal  pain, blood in stool and constipation.  Endocrine: Negative for increased urination.  Genitourinary: Negative for difficulty urinating.  Musculoskeletal: Positive for arthralgias, joint pain, joint swelling and morning stiffness. Negative for myalgias, muscle weakness, muscle tenderness and myalgias.  Skin: Negative for color change, rash and redness.  Allergic/Immunologic: Negative for susceptible to infections.  Neurological: Negative for dizziness, numbness, headaches, memory loss and weakness.  Hematological: Negative for swollen glands.  Psychiatric/Behavioral: Positive for sleep disturbance. Negative for depressed mood and confusion. The patient is not nervous/anxious.     PMFS History:  Patient Active Problem List   Diagnosis Date Noted  . Idiopathic gout of multiple sites 05/21/2020  . Generalized osteoarthritis 05/21/2020  . SOB (shortness of breath) 11/25/2019  . Fatigue 11/25/2019  . Educated about COVID-19 virus infection 08/28/2019  . Dyslipidemia 08/20/2018  . Medication management 11/20/2017  . Hyperlipidemia 11/20/2017  . CAD (coronary artery disease) 09/09/2017  . Unstable angina ([facility]) 09/07/2017  . Osteoarthritis of left knee 06/08/2016  . Osteoarthritis of right knee 06/08/2016  . Bilateral carpal tunnel syndrome 06/08/2016  . Degenerative disc disease, lumbar 04/06/2015  . Chest pressure 11/05/2012  . PAROXYSMAL VENTRICULAR TACHYCARDIA 08/21/2009  . OBESITY, NOS 10/12/2006  . HYPERTENSION, BENIGN SYSTEMIC 10/12/2006  . CARDIOMEGALY 10/12/2006  . RHINITIS, ALLERGIC 10/12/2006  . MENOPAUSAL SYNDROME 10/12/2006  . OSTEOARTHRITIS, LOWER LEG 10/12/2006  . CERVICAL SPINE DISORDER, NOS 10/12/2006    Past Medical History:  Diagnosis Date  . Allergy   . Arthritis   . CAD (  coronary artery disease)    95% proximal LAD stenosis with DES.  Jan 2019  . Cardiomegaly   . Cardiomyopathy ([facility])    EF 35% in the distant past but NL EF 2009  . Hypertension, benign     systemic  . Menopausal syndrome   . Obesity   . Osteoarthritis of lower leg, localized   . Paroxysmal VT ([facility])   . Thyroid disease     Family History  Problem Relation Age of Onset  . Heart disease Mother 62       MI  . Heart disease Father 75       CABG  . CAD Sister 13       Stent  . Lymphoma Sister   . Stroke Maternal Grandmother   . Diabetes Maternal Grandfather   . Diabetes Maternal Uncle    Past Surgical History:  Procedure Laterality Date  . CARPAL TUNNEL RELEASE Right 11/2016  . CATARACT EXTRACTION  2020   Per patient  . LEFT HEART CATH AND CORONARY ANGIOGRAPHY N/A 09/08/2017   Procedure: LEFT HEART CATH AND CORONARY ANGIOGRAPHY;  Surgeon: Yvonne Kendall, MD;  Location: [facility];  Service: Cardiovascular;  Laterality: N/A;  . MASS EXCISION  03/15/2012   Procedure: MINOR EXCISION OF MASS;  Surgeon: Wyn Forster., MD;  Location: [facility];  Service: Orthopedics;  Laterality: Right;  debride IP right long finger, excision mucoid cyst  . ventricular tacticartia     Social History   Social History Narrative   Financial controller for [employer].  Education: 78yrs.  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 Caffeine 2 cups decaf. (not every day).   Immunization History  Administered Date(s) Administered  . Influenza Split 08/28/2011  . Influenza, High Dose Seasonal PF 07/19/2018  . Influenza,inj,Quad PF,6+ Mos 07/22/2014  . Influenza-Unspecified 05/29/2016, 05/15/2017  . Moderna SARS-COVID-2 Vaccination 09/13/2019, 10/09/2019  . Pneumococcal Polysaccharide-23 07/28/2009  . Td 07/15/2000, 02/21/2012  . Tdap 02/21/2012  . Zoster 05/16/2011  . Zoster Recombinat (Shingrix) 08/20/2018, 01/31/2019     Objective: Vital Signs: BP 112/79 (BP Location: Left Arm, Patient Position: Sitting, Cuff Size: Small)   Pulse 72   Ht 5' 4.5" (1.638 m)   Wt 192 lb 6.4 oz (87.3 kg)   BMI 32.52 kg/m    Physical Exam HENT:     Right Ear: External ear normal.     Left Ear: External ear  normal.     Mouth/Throat:     Mouth: Mucous membranes are moist.     Pharynx: Oropharynx is clear.  Cardiovascular:     Rate and Rhythm: Normal rate and regular rhythm.  Pulmonary:     Effort: Pulmonary effort is normal.     Breath sounds: Normal breath sounds.  Skin:    General: Skin is warm and dry.      Musculoskeletal Exam: Neck full range of motion no tenderness Shoulder, elbow, wrist, fingers full range of motion no tenderness or swelling, bony nodules present over PIP and DIP joints on both hands No paraspinal tenderness to palpation over upper and lower back Normal hip internal and external rotation without pain, no tenderness to lateral hip palpation Knees, ankles, MTPs full range of motion no tenderness or swelling, bilateral patellofemoral crepitus present, bunions with lateral deviation of the MTP joint present bilaterally  CDAI Exam: CDAI Score: -- Patient Global: --; Provider Global: -- Swollen: --; Tender: -- Joint Exam 05/21/2020   No joint exam has been documented for this visit  There is currently no information documented on the homunculus. Go to the Rheumatology activity and complete the homunculus joint exam.  Investigation: No additional findings.  Imaging: XR Foot 2 Views Left  Result Date: 05/21/2020 05/21/2020 x-ray 2 view left foot Normal tibiotalar joint alignment with normal joint space. No significant changes in the midfoot. Some loss of joint space at the medial side of first MTP no erosions or osteophyte formation. Joint space preserved in other MTP joints with some lateral deviation in 1-3. Grossly normal bone mineralization throughout. No soft tissue swelling appreciated. Few calcifications visible in anterior tibial artery. Small calcaneal enthesophyte present. Impression Degenerative change in first MTP joint without evidence of inflammatory changes or erosions.  XR Foot 2 Views Right  Result Date: 05/21/2020 05/21/2020 x-ray 2 view  right foot Normal tibiotalar joint alignment with normal joint space.  No significant changes in the midfoot.  Significant loss of joint space at the medial first MTP no erosions or osteophyte formation.  Joint space preserved in other MTP joints.  Normal bone mineralization throughout.  No soft tissue swelling appreciated. Impression Degenerative change in first MTP joint without evidence of inflammatory changes or erosions.   Recent Labs: Lab Results  Component Value Date   WBC 6.7 08/29/2019   HGB 13.7 08/29/2019   PLT 205 08/29/2019   NA 140 04/29/2020   K 4.6 04/29/2020   CL 102 04/29/2020   CO2 25 04/29/2020   GLUCOSE 78 04/29/2020   BUN 13 04/29/2020   CREATININE 0.87 04/29/2020   BILITOT 0.6 02/28/2020   ALKPHOS 94 02/28/2020   AST 21 02/28/2020   ALT 24 02/28/2020   PROT 7.0 02/28/2020   ALBUMIN 4.4 02/28/2020   CALCIUM 9.5 04/29/2020   GFRAA 74 04/29/2020    Speciality Comments: No specialty comments available.  Procedures:  No procedures performed Allergies: Epinephrine and Latex   Assessment / Plan:     Visit Diagnoses: Idiopathic chronic gout of multiple sites without tophus - Plan: XR Foot 2 Views Right, XR Foot 2 Views Left, allopurinol (ZYLOPRIM) 300 MG tablet, colchicine 0.6 MG tablet  Classic podagra with hyperuricemia extremely consistent with gout even in absence of synovial fluid analysis. She is now at goal although colchicine prophylaxis for flares can be considered for several months after both achieving goal and last active flare of disease. No clear underlying health factors as a secondary cause. Obtain x-rays in office today that did not demonstrate erosive or tophaceous changes. Continue allopurinol at the current 300 mg daily dose recommend adding colchicine once daily as gout prophylaxis with follow-up in 1 month for repeat lab and clinical assessment. If flares continued for subsequent months despite achieving uric acid goal was strongly encouraged  her to come for synovial fluid analysis to exclude alternative causes.  Generalized osteoarthritis Bouchard and [place] nodules on hands bilaterally, patellofemoral crepitus bilaterally, first MTP asymmetric joint space loss on x-rays all consistent with generalized osteoarthritis. Changes did not appear to be erosive or related to the gout.  Orders: Orders Placed This Encounter  Procedures  . XR Foot 2 Views Right  . XR Foot 2 Views Left   Meds ordered this encounter  Medications  . allopurinol (ZYLOPRIM) 300 MG tablet    Sig: Take 1 tablet (300 mg total) by mouth daily.    Dispense:  90 tablet    Refill:  0  . colchicine 0.6 MG tablet    Sig: Take 1 tablet (0.6 mg total) by mouth daily.  Dispense:  90 tablet    Refill:  0     Follow-Up Instructions: Return in about 4 weeks (around 06/18/2020), or Gout f/u and labs.   Fuller Planhristopher W Zienna Ahlin, MD  Note - This record has been created using [employer] software.  Chart creation errors have been sought, but may not always  have been located. Such creation errors do not reflect on  the standard of medical care.

## 2020-05-21 ENCOUNTER — Other Ambulatory Visit: Payer: Self-pay

## 2020-05-21 ENCOUNTER — Ambulatory Visit: Payer: Self-pay

## 2020-05-21 ENCOUNTER — Encounter: Payer: Self-pay | Admitting: Internal Medicine

## 2020-05-21 ENCOUNTER — Ambulatory Visit (INDEPENDENT_AMBULATORY_CARE_PROVIDER_SITE_OTHER): Payer: BC Managed Care – PPO | Admitting: Internal Medicine

## 2020-05-21 VITALS — BP 112/79 | HR 72 | Ht 64.5 in | Wt 192.4 lb

## 2020-05-21 DIAGNOSIS — M159 Polyosteoarthritis, unspecified: Secondary | ICD-10-CM | POA: Diagnosis not present

## 2020-05-21 DIAGNOSIS — M1A09X0 Idiopathic chronic gout, multiple sites, without tophus (tophi): Secondary | ICD-10-CM | POA: Diagnosis not present

## 2020-05-21 DIAGNOSIS — M1009 Idiopathic gout, multiple sites: Secondary | ICD-10-CM | POA: Insufficient documentation

## 2020-05-21 DIAGNOSIS — M109 Gout, unspecified: Secondary | ICD-10-CM | POA: Insufficient documentation

## 2020-05-21 MED ORDER — ALLOPURINOL 300 MG PO TABS: 300.0000 mg | ORAL_TABLET | Freq: Every day | ORAL | 0 refills | Status: DC

## 2020-05-21 MED ORDER — COLCHICINE 0.6 MG PO TABS: 0.6000 mg | ORAL_TABLET | Freq: Every day | ORAL | 0 refills | Status: DC

## 2020-05-21 NOTE — Progress Notes (Signed)
Cardiology Office Note   Date:  05/22/2020   ID:  Lenee, Franze Dec 19, 1940, MRN [id number]  PCP:  Rutherford Guys, MD  Cardiologist:   Minus Breeding, MD   Chief Complaint  Patient presents with  . Shortness of Breath      History of Present Illness: Carmen Brown is a 79 y.o. female who presents for follow up of CAD.  She was admitted with [place] in Jan 2019.  She was found to have 95% LAD stenosis treated stent.  EF was normal.    When I last saw her she had SOB and I ordered a POET (Plain Old Exercise Treadmill) which was negative for ischemia in August.   Since that time she has had no new cardiovascular complaints.  She denies any chest pressure, neck or arm discomfort.  She has had no palpitations, presyncope or syncope.  She has had no weight gain or edema.  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   Past Medical History:  Diagnosis Date  . Allergy   . Arthritis   . CAD (coronary artery disease)    95% proximal LAD stenosis with DES.  Jan 2019  . Cardiomegaly   . Cardiomyopathy ([place])    EF 35% in the distant past but NL EF 2009  . Hypertension, benign    systemic  . Menopausal syndrome   . Obesity   . Osteoarthritis of lower leg, localized   . Paroxysmal VT ([place])   . Thyroid disease     Past Surgical History:  Procedure Laterality Date  . CARPAL TUNNEL RELEASE Right 11/2016  . CATARACT EXTRACTION  2020   Per patient  . LEFT HEART CATH AND CORONARY ANGIOGRAPHY N/A 09/08/2017   Procedure: LEFT HEART CATH AND CORONARY ANGIOGRAPHY;  Surgeon: Nelva Bush, MD;  Location: [facility];  Service: Cardiovascular;  Laterality: N/A;  . MASS EXCISION  03/15/2012   Procedure: MINOR EXCISION OF MASS;  Surgeon: Cammie Sickle., MD;  Location: [facility];  Service: Orthopedics;  Laterality: Right;  debride IP right long finger, excision mucoid cyst  . ventricular tacticartia       Current Outpatient Medications  Medication Sig  Dispense Refill  . allopurinol (ZYLOPRIM) 300 MG tablet Take 1 tablet (300 mg total) by mouth daily. 90 tablet 0  . amLODipine (NORVASC) 2.5 MG tablet Take 2.5 mg twice a day 180 tablet 3  . aspirin EC 81 MG tablet Take 81 mg by mouth daily.    Marland Kitchen atorvastatin (LIPITOR) 40 MG tablet TAKE 1 TABLET BY MOUTH EVERY DAY AT 6 PM 90 tablet 3  . Biotin 5000 MCG CAPS Take 1 capsule by mouth daily. Takes daily     . colchicine 0.6 MG tablet Take 1 tablet (0.6 mg total) by mouth daily. 90 tablet 0  . fluticasone (FLONASE) 50 MCG/ACT nasal spray Place 1 spray into both nostrils 2 (two) times daily. 16 mL 3  . Glucosamine HCl (GLUCOSAMINE PO) Take by mouth.    . metoprolol succinate (TOPROL-XL) 25 MG 24 hr tablet TAKE 1 TABLET(25 MG) BY MOUTH DAILY 90 tablet 3  . Multiple Vitamin (MULTIVITAMIN) tablet Take 1 tablet by mouth daily.     . nitroGLYCERIN (NITROSTAT) 0.4 MG SL tablet Place 1 tablet (0.4 mg total) under the tongue every 5 (five) minutes x 3 doses as needed for chest pain. 25 tablet 2  . Pyridoxine HCl (VITAMIN B-6 PO) Take  by mouth.    . Zoster Vaccine Adjuvanted [facility]) injection Shingrix (PF) 50 mcg/0.5 mL intramuscular suspension, kit    . lisinopril (ZESTRIL) 20 MG tablet Take 1 tablet (20 mg total) by mouth 2 (two) times daily. 180 tablet 3   No current facility-administered medications for this visit.    Allergies:   Epinephrine and Latex    ROS:  Please see the history of present illness.   Otherwise, review of systems are positive for none.   All other systems are reviewed and negative.    PHYSICAL EXAM: VS:  BP 127/76   Pulse 80   Temp (!) 96.1 F (35.6 C)   Ht _0  (1.626 m)   Wt 192 lb (87.1 kg)   SpO2 96%   BMI 32.96 kg/m  , BMI Body mass index is 32.96 kg/m.  GENERAL:  Well appearing NECK:  No jugular venous distention, waveform within normal limits, carotid upstroke brisk and symmetric, no bruits, no thyromegaly LUNGS:  Clear to auscultation bilaterally CHEST:   Unremarkable HEART:  PMI not displaced or sustained,S1 and S2 within normal limits, no S3, no S4, no clicks, no rubs, no murmurs ABD:  Flat, positive bowel sounds normal in frequency in pitch, no bruits, no rebound, no guarding, no midline pulsatile mass, no hepatomegaly, no splenomegaly EXT:  2 plus pulses throughout, no edema, no cyanosis no clubbing   EKG:  EKG is  ordered today. Sinus rhythm, rate 80, axis within normal limits, intervals within normal limits, no acute ST-T wave changes.  Recent Labs: 08/29/2019: BNP 36.3; Hemoglobin 13.7; Platelets 205; TSH 3.780 02/28/2020: ALT 24 04/29/2020: BUN 13; Creatinine, Ser 0.87; Potassium 4.6; Sodium 140    Lipid Panel    Component Value Date/Time   CHOL 114 08/29/2019 1023   TRIG 127 08/29/2019 1023   HDL 46 08/29/2019 1023   CHOLHDL 2.5 08/29/2019 1023   CHOLHDL 3.9 09/08/2017 0259   VLDL 20 09/08/2017 0259   LDLCALC 46 08/29/2019 1023      Wt Readings from Last 3 Encounters:  05/22/20 192 lb (87.1 kg)  05/21/20 192 lb 6.4 oz (87.3 kg)  04/23/20 197 lb 9.6 oz (89.6 kg)      Other studies Reviewed: Additional studies/ records that were reviewed today include:   None Review of the above records demonstrates:  NA   ASSESSMENT AND PLAN:   CAD:  The patient has no new sypmtoms.  No further cardiovascular testing is indicated.  We will continue with aggressive risk reduction and meds as listed.  HTN:  The blood pressure is at target. No change in medications is indicated. We will continue with therapeutic lifestyle changes (TLC).  DYSLIPIDEMIA:   LDL was 46 with an HDL of 46.  No change in therapy. .   DYSPNEA: At this point she thinks her breathing is baseline and [place].  She will let me know if she has any future symptoms.  No change in therapy at this point.   COVID EDUCATION: She has been vaccinated.  Current medicines are reviewed at length with the patient today.   The patient does not have concerns regarding medicines.  The following changes have been made:  None  Labs/ tests ordered today include: None  Orders Placed This Encounter  Procedures  . EKG 12-Lead     Disposition:   FU with me in 12 months.   Signed, Minus Breeding, MD  05/22/2020  12:25 PM    [facility]

## 2020-05-21 NOTE — Patient Instructions (Addendum)
Allopurinol What is this medicine? ALLOPURINOL (al oh PURE i nole) reduces the amount of uric acid the body makes during chemotherapy. Too much uric acid in the blood can cause damage to your kidneys. This medicine may be used for other purposes; ask your health care provider or pharmacist if you have questions. COMMON BRAND NAME(S): Aloprim What should I tell my health care provider before I take this medicine? They need to know if you have any of these conditions:  kidney disease  liver disease  an unusual or allergic reaction to allopurinol, other medicines, foods, dyes, or preservatives  pregnant or trying to get pregnant  breast feeding How should I use this medicine? The medicine is for infusion into a vein. It is given by a health care professional in a hospital or clinic setting. Talk to your pediatrician regarding the use of this medicine in children. Special care may be needed. Overdosage: If you think you have taken too much of this medicine contact a poison control center or emergency room at once. NOTE: This medicine is only for you. Do not share this medicine with others. What if I miss a dose? This does not apply. What may interact with this medicine? Do not take this medicine with the following medication:  didanosine, ddI This medicine may also interact with the following medications:  certain antibiotics like amoxicillin, ampicillin  certain medicines for cancer  certain medicines for immunosuppression like azathioprine, cyclosporine, mercaptopurine  chlorpropamide  probenecid  thiazide diuretics, like hydrochlorothiazide  sulfinpyrazone  warfarin This list may not describe all possible interactions. Give your health care provider a list of all the medicines, herbs, non-prescription drugs, or dietary supplements you use. Also tell them if you smoke, drink alcohol, or use illegal drugs. Some items may interact with your medicine. What should I watch for  while using this medicine? Your condition will be monitored carefully while you are receiving this medicine. This medicine may cause serious skin reactions. They can happen weeks to months after starting the medicine. Contact your healthcare provider right away if you notice fevers or flu-like symptoms with a rash. The rash may be red or purple and then turn into blisters or peeling of the skin. Or, you might notice a red rash with swelling of the face, lips or lymph nodes in your neck or under your arms. You may get drowsy or dizzy. Do not drive, use machinery, or do anything that needs mental alertness until you know how this medicine affects you. Do not stand or sit up quickly, especially if you are an older patient. This reduces the risk of dizzy or fainting spells. Drink plenty of water while you are taking this medicine. This will help to reduce the risk of getting gout or kidney stones. What side effects may I notice from receiving this medicine? Side effects that you should report to your doctor or health care professional as soon as possible:  allergic reactions like skin rash, itching or hives, swelling of the face, lips, or tongue  breathing problems  joint pain  muscle pain  rash, fever, and swollen lymph nodes  redness, blistering, peeling, or loosening of the skin, including inside the mouth  signs and symptoms of infection like fever or chills; cough; sore throat  signs and symptoms of kidney injury like trouble passing urine or change in the amount of urine, flank pain  tingling, numbness in the hands or feet  unusual bleeding or bruising  unusually weak or tired Side  effects that usually do not require medical attention (report to your doctor or health care professional if they continue or are bothersome):  changes in taste  diarrhea  drowsiness  headache  nausea, vomiting  stomach upset This list may not describe all possible side effects. Call your doctor  for medical advice about side effects. You may report side effects to FDA at 1-800-FDA-1088. Where should I keep my medicine? This drug is given in a hospital or clinic and will not be stored at home. NOTE: This sheet is a summary. It may not cover all possible information. If you have questions about this medicine, talk to your doctor, pharmacist, or health care provider.  2020 Elsevier/Gold Standard (2018-10-23 09:38:12)        Colchicine tablets or capsules What is this medicine? COLCHICINE (KOL chi seen) is used to prevent or treat attacks of acute gout or gouty arthritis. This medicine is also used to treat familial Mediterranean fever. This medicine may be used for other purposes; ask your health care provider or pharmacist if you have questions. COMMON BRAND NAME(S): Colcrys, MITIGARE What should I tell my health care provider before I take this medicine? They need to know if you have any of these conditions:  kidney disease  liver disease  an unusual or allergic reaction to colchicine, other medicines, foods, dyes, or preservatives  pregnant or trying to get pregnant  breast-feeding How should I use this medicine? Take this medicine by mouth with a full glass of water. Follow the directions on the prescription label. You can take it with or without food. If it upsets your stomach, take it with food. Take your medicine at regular intervals. Do not take your medicine more often than directed. A special MedGuide will be given to you by the pharmacist with each prescription and refill. Be sure to read this information carefully each time. Talk to your pediatrician regarding the use of this medicine in children. While this drug may be prescribed for children as young as 23 years old for selected conditions, precautions do apply. Patients over 69 years old may have a stronger reaction and need a smaller dose. Overdosage: If you think you have taken too much of this medicine contact  a poison control center or emergency room at once. NOTE: This medicine is only for you. Do not share this medicine with others. What if I miss a dose? If you miss a dose, take it as soon as you can. If it is almost time for your next dose, take only that dose. Do not take double or extra doses. What may interact with this medicine? Do not take this medicine with any of the following medications:  certain antivirals for HIV or hepatitis This medicine may also interact with the following medications:  certain antibiotics like erythromycin or clarithromycin  certain medicines for blood pressure, heart disease, irregular heart beat  certain medicines for cholesterol like atorvastatin, lovastatin, and simvastatin  certain medicines for fungal infections like ketoconazole, itraconazole, or posaconazole  cyclosporine  grapefruit or grapefruit juice This list may not describe all possible interactions. Give your health care provider a list of all the medicines, herbs, non-prescription drugs, or dietary supplements you use. Also tell them if you smoke, drink alcohol, or use illegal drugs. Some items may interact with your medicine. What should I watch for while using this medicine? Visit your healthcare professional for regular checks on your progress. Tell your healthcare professional if your symptoms do not start to  get better or if they get worse. You should make sure you get enough vitamin B12 while you are taking this medicine. Discuss the foods you eat and the vitamins you take with your healthcare professional. This medicine may increase your risk to bruise or bleed. Call you healthcare professional if you notice any unusual bleeding. What side effects may I notice from receiving this medicine? Side effects that you should report to your doctor or health care professional as soon as possible:  allergic reactions like skin rash, itching or hives, swelling of the face, lips, or tongue  low  blood counts - this medicine may decrease the number of white blood cells, red blood cells, and platelets. You may be at increased risk for infections and bleeding  pain, tingling, numbness in the hands or feet  severe diarrhea  signs and symptoms of infection like fever; chills; cough; sore throat; pain or trouble passing urine  signs and symptoms of muscle injury like dark urine; trouble passing urine or change in the amount of urine; unusually weak or tired; muscle pain; back pain  unusual bleeding or bruising  unusually weak or tired  vomiting Side effects that usually do not require medical attention (report these to your doctor or health care professional if they continue or are bothersome):  mild diarrhea  nausea  stomach pain This list may not describe all possible side effects. Call your doctor for medical advice about side effects. You may report side effects to FDA at 1-800-FDA-1088. Where should I keep my medicine? Keep out of the reach of children. Store at room temperature between 15 and 30 degrees C (59 and 86 degrees F). Keep container tightly closed. Protect from light. Throw away any unused medicine after the expiration date. NOTE: This sheet is a summary. It may not cover all possible information. If you have questions about this medicine, talk to your doctor, pharmacist, or health care provider.  2020 Elsevier/Gold Standard (2017-10-18 18:45:11)

## 2020-05-22 ENCOUNTER — Encounter: Payer: Self-pay | Admitting: Cardiology

## 2020-05-22 ENCOUNTER — Ambulatory Visit (INDEPENDENT_AMBULATORY_CARE_PROVIDER_SITE_OTHER): Payer: BC Managed Care – PPO | Admitting: Cardiology

## 2020-05-22 ENCOUNTER — Other Ambulatory Visit: Payer: Self-pay

## 2020-05-22 VITALS — BP 127/76 | HR 80 | Temp 96.1°F | Ht 64.0 in | Wt 192.0 lb

## 2020-05-22 DIAGNOSIS — I10 Essential (primary) hypertension: Secondary | ICD-10-CM | POA: Diagnosis not present

## 2020-05-22 DIAGNOSIS — I2510 Atherosclerotic heart disease of native coronary artery without angina pectoris: Secondary | ICD-10-CM

## 2020-05-22 DIAGNOSIS — E785 Hyperlipidemia, unspecified: Secondary | ICD-10-CM

## 2020-05-22 DIAGNOSIS — R5383 Other fatigue: Secondary | ICD-10-CM

## 2020-05-22 MED ORDER — LISINOPRIL 20 MG PO TABS: 20.0000 mg | ORAL_TABLET | Freq: Two times a day (BID) | ORAL | 3 refills | Status: DC

## 2020-05-22 MED ORDER — METOPROLOL SUCCINATE ER 25 MG PO TB24: ORAL_TABLET | ORAL | 3 refills | Status: DC

## 2020-05-22 MED ORDER — AMLODIPINE BESYLATE 2.5 MG PO TABS: ORAL_TABLET | ORAL | 3 refills | Status: DC

## 2020-05-22 MED ORDER — ATORVASTATIN CALCIUM 40 MG PO TABS: ORAL_TABLET | ORAL | 3 refills | Status: DC

## 2020-05-22 NOTE — Patient Instructions (Signed)
Medication Instructions:  °Your physician recommends that you continue on your current medications as directed. Please refer to the Current Medication list given to you today. ° °*If you need a refill on your cardiac medications before your next appointment, please call your pharmacy* ° ° °Follow-Up: °At [facility], you and your health needs are our priority.  As part of our continuing mission to provide you with exceptional heart care, we have created designated Provider Care Teams.  These Care Teams include your primary Cardiologist (physician) and Advanced Practice Providers (APPs -  Physician Assistants and Nurse Practitioners) who all work together to provide you with the care you need, when you need it. ° °We recommend signing up for the patient portal called "MyChart".  Sign up information is provided on this After Visit Summary.  MyChart is used to connect with patients for Virtual Visits (Telemedicine).  Patients are able to view lab/test results, encounter notes, upcoming appointments, etc.  Non-urgent messages can be sent to your provider as well.   °To learn more about what you can do with MyChart, go to [email or web address].   ° °Your next appointment:   °12 month(s) ° °The format for your next appointment:   °In Person ° °Provider:   °You may see James Hochrein, MD or one of the following Advanced Practice Providers on your designated Care Team:   °· Rhonda Barrett, PA-C °· Kathryn Lawrence, DNP, ANP ° ° ° °Other Instructions ° ° °

## 2020-06-08 ENCOUNTER — Other Ambulatory Visit: Payer: Self-pay | Admitting: Pharmacist

## 2020-06-10 ENCOUNTER — Ambulatory Visit (INDEPENDENT_AMBULATORY_CARE_PROVIDER_SITE_OTHER): Payer: BC Managed Care – PPO | Admitting: Internal Medicine

## 2020-06-10 ENCOUNTER — Encounter: Payer: Self-pay | Admitting: Internal Medicine

## 2020-06-10 ENCOUNTER — Other Ambulatory Visit: Payer: Self-pay

## 2020-06-10 VITALS — BP 131/78 | HR 80 | Ht 64.5 in | Wt 197.0 lb

## 2020-06-10 DIAGNOSIS — M1A09X0 Idiopathic chronic gout, multiple sites, without tophus (tophi): Secondary | ICD-10-CM | POA: Diagnosis not present

## 2020-06-10 DIAGNOSIS — Z79899 Other long term (current) drug therapy: Secondary | ICD-10-CM | POA: Diagnosis not present

## 2020-06-10 LAB — CBC WITH DIFFERENTIAL/PLATELET
Absolute Monocytes: 717 cells/uL (ref 200–950)
Basophils Absolute: 60 cells/uL (ref 0–200)
Basophils Relative: 0.9 %
Eosinophils Absolute: 241 cells/uL (ref 15–500)
Eosinophils Relative: 3.6 %
HCT: 40.4 % (ref 35.0–45.0)
Hemoglobin: 13.9 g/dL (ref 11.7–15.5)
Lymphs Abs: 2157 cells/uL (ref 850–3900)
MCH: 32.8 pg (ref 27.0–33.0)
MCHC: 34.4 g/dL (ref 32.0–36.0)
MCV: 95.3 fL (ref 80.0–100.0)
MPV: 12.5 fL (ref 7.5–12.5)
Monocytes Relative: 10.7 %
Neutro Abs: 3524 cells/uL (ref 1500–7800)
Neutrophils Relative %: 52.6 %
Platelets: 225 10*3/uL (ref 140–400)
RBC: 4.24 10*6/uL (ref 3.80–5.10)
RDW: 12.8 % (ref 11.0–15.0)
Total Lymphocyte: 32.2 %
WBC: 6.7 10*3/uL (ref 3.8–10.8)

## 2020-06-10 MED ORDER — ALLOPURINOL 300 MG PO TABS: 300.0000 mg | ORAL_TABLET | Freq: Every day | ORAL | 0 refills | Status: DC

## 2020-06-10 MED ORDER — COLCHICINE 0.6 MG PO TABS: 0.6000 mg | ORAL_TABLET | Freq: Every day | ORAL | 0 refills | Status: DC

## 2020-06-10 NOTE — Progress Notes (Signed)
Office Visit Note  Patient: Carmen Brown             Date of Birth: [birth year]           MRN: [id number]             PCP: Myles Lipps, MD (Inactive) Referring: Myles Lipps, MD Visit Date: 06/10/2020 Occupation: Flight attendant  Subjective:  Medication Management (doing well overall)  History of Present Illness: Carmen Brown is a 79 y.o. female here for follow up of gout. She is taking allopurinol 300mg  daily and since one month ago was directed to take colchicine 0.6mg  daily for prophylaxis since attacks were occuring at increased frequency despite achieving uric acid goal recently with allopurinol dose.  She states 1 day after her last visit she developed left lateral midfoot pain that quickly improved and since then has had 0 repeat gout attacks.  She denies any side effects or trouble taking the prescribed medication.  Activities of Daily Living:  Patient reports morning stiffness for 5-10 minutes.   Patient Denies nocturnal pain.  Difficulty dressing/grooming: Denies Difficulty climbing stairs: Reports Difficulty getting out of chair: Denies Difficulty using hands for taps, buttons, cutlery, and/or writing: Denies  Review of Systems  Constitutional: Negative for fatigue.  HENT: Negative for mouth sores, mouth dryness and nose dryness.   Eyes: Positive for dryness. Negative for pain, itching and visual disturbance.  Respiratory: Negative for cough, hemoptysis, shortness of breath and difficulty breathing.   Cardiovascular: Negative for chest pain, palpitations and swelling in legs/feet.  Gastrointestinal: Negative for abdominal pain, blood in stool, constipation and diarrhea.  Endocrine: Negative for increased urination.  Genitourinary: Negative for painful urination.  Musculoskeletal: Positive for arthralgias, joint pain and morning stiffness. Negative for joint swelling, myalgias, muscle weakness, muscle tenderness and myalgias.  Skin: Negative for color  change, rash and redness.  Allergic/Immunologic: Negative for susceptible to infections.  Neurological: Negative for dizziness, numbness, headaches, memory loss and weakness.  Hematological: Negative for swollen glands.  Psychiatric/Behavioral: Positive for sleep disturbance. Negative for confusion.    PMFS History:  Patient Active Problem List   Diagnosis Date Noted  . Idiopathic gout of multiple sites 05/21/2020  . Generalized osteoarthritis 05/21/2020  . SOB (shortness of breath) 11/25/2019  . Fatigue 11/25/2019  . Educated about COVID-19 virus infection 08/28/2019  . Dyslipidemia 08/20/2018  . Medication management 11/20/2017  . Hyperlipidemia 11/20/2017  . CAD (coronary artery disease) 09/09/2017  . Unstable angina (HCC) 09/07/2017  . Osteoarthritis of left knee 06/08/2016  . Osteoarthritis of right knee 06/08/2016  . Bilateral carpal tunnel syndrome 06/08/2016  . Degenerative disc disease, lumbar 04/06/2015  . Chest pressure 11/05/2012  . PAROXYSMAL VENTRICULAR TACHYCARDIA 08/21/2009  . OBESITY, NOS 10/12/2006  . HYPERTENSION, BENIGN SYSTEMIC 10/12/2006  . CARDIOMEGALY 10/12/2006  . RHINITIS, ALLERGIC 10/12/2006  . MENOPAUSAL SYNDROME 10/12/2006  . OSTEOARTHRITIS, LOWER LEG 10/12/2006  . CERVICAL SPINE DISORDER, NOS 10/12/2006    Past Medical History:  Diagnosis Date  . Allergy   . Arthritis   . CAD (coronary artery disease)    95% proximal LAD stenosis with DES.  Jan 2019  . Cardiomegaly   . Cardiomyopathy (HCC)    EF 35% in the distant past but NL EF 2009  . Hypertension, benign    systemic  . Menopausal syndrome   . Obesity   . Osteoarthritis of lower leg, localized   . Paroxysmal VT (HCC)   . Thyroid disease  Family History  Problem Relation Age of Onset  . Heart disease Mother 85       MI  . Heart disease Father 61       CABG  . CAD Sister 60       Stent  . Lymphoma Sister   . Stroke Maternal Grandmother   . Diabetes Maternal Grandfather   .  Diabetes Maternal Uncle    Past Surgical History:  Procedure Laterality Date  . CARPAL TUNNEL RELEASE Right 11/2016  . CATARACT EXTRACTION  2020   Per patient  . LEFT HEART CATH AND CORONARY ANGIOGRAPHY N/A 09/08/2017   Procedure: LEFT HEART CATH AND CORONARY ANGIOGRAPHY;  Surgeon: Yvonne Kendall, MD;  Location: [facility];  Service: Cardiovascular;  Laterality: N/A;  . MASS EXCISION  03/15/2012   Procedure: MINOR EXCISION OF MASS;  Surgeon: Wyn Forster., MD;  Location: [facility];  Service: Orthopedics;  Laterality: Right;  debride IP right long finger, excision mucoid cyst  . ventricular tacticartia     Social History   Social History Narrative   Financial controller for [employer].  Education: 57yrs.  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 Caffeine 2 cups decaf. (not every day).   Immunization History  Administered Date(s) Administered  . Influenza Split 08/28/2011  . Influenza, High Dose Seasonal PF 07/19/2018  . Influenza,inj,Quad PF,6+ Mos 07/22/2014  . Influenza-Unspecified 05/29/2016, 05/15/2017  . Moderna SARS-COVID-2 Vaccination 09/13/2019, 10/09/2019  . Pneumococcal Polysaccharide-23 07/28/2009  . Td 07/15/2000, 02/21/2012  . Tdap 02/21/2012  . Zoster 05/16/2011  . Zoster Recombinat (Shingrix) 08/20/2018, 01/31/2019     Objective: Vital Signs: BP 131/78 (BP Location: Right Arm, Patient Position: Sitting, Cuff Size: Small)   Pulse 80   Ht 5' 4.5" (1.638 m)   Wt 197 lb (89.4 kg)   BMI 33.29 kg/m    Physical Exam Eyes:     Conjunctiva/sclera: Conjunctivae normal.  Skin:    General: Skin is warm and dry.  Neurological:     Mental Status: She is alert.     Musculoskeletal Exam:  Elbow, wrist, fingers full range of motion no tenderness or swelling, Heberden's nodes present on PIP and DIP joints both hands, chronic soft tissue swelling over left fourth PIP Knees, ankles, MTPs full range of motion no tenderness or swelling, bilateral bunions with first MTP  deviation  CDAI Exam: CDAI Score: -- Patient Global: --; Provider Global: -- Swollen: --; Tender: -- Joint Exam 06/10/2020   No joint exam has been documented for this visit   There is currently no information documented on the homunculus. Go to the Rheumatology activity and complete the homunculus joint exam.  Investigation: No additional findings.  Imaging: XR Foot 2 Views Left  Result Date: 05/21/2020 05/21/2020 x-ray 2 view left foot Normal tibiotalar joint alignment with normal joint space. No significant changes in the midfoot. Some loss of joint space at the medial side of first MTP no erosions or osteophyte formation. Joint space preserved in other MTP joints with some lateral deviation in 1-3. Grossly normal bone mineralization throughout. No soft tissue swelling appreciated. Few calcifications visible in anterior tibial artery. Small calcaneal enthesophyte present. Impression Degenerative change in first MTP joint without evidence of inflammatory changes or erosions.  XR Foot 2 Views Right  Result Date: 05/21/2020 05/21/2020 x-ray 2 view right foot Normal tibiotalar joint alignment with normal joint space.  No significant changes in the midfoot.  Significant loss of joint space at the medial first MTP no erosions  or osteophyte formation.  Joint space preserved in other MTP joints.  Normal bone mineralization throughout.  No soft tissue swelling appreciated. Impression Degenerative change in first MTP joint without evidence of inflammatory changes or erosions.   Recent Labs: Lab Results  Component Value Date   WBC 6.7 08/29/2019   HGB 13.7 08/29/2019   PLT 205 08/29/2019   NA 140 04/29/2020   K 4.6 04/29/2020   CL 102 04/29/2020   CO2 25 04/29/2020   GLUCOSE 78 04/29/2020   BUN 13 04/29/2020   CREATININE 0.87 04/29/2020   BILITOT 0.6 02/28/2020   ALKPHOS 94 02/28/2020   AST 21 02/28/2020   ALT 24 02/28/2020   PROT 7.0 02/28/2020   ALBUMIN 4.4 02/28/2020    CALCIUM 9.5 04/29/2020   GFRAA 74 04/29/2020    Speciality Comments: No specialty comments available.  Procedures:  No procedures performed Allergies: Epinephrine and Latex   Assessment / Plan:     Visit Diagnoses: Idiopathic chronic gout of multiple sites without tophus - Plan: CBC with Differential/Platelet Medication management  She remains free of any attacks for the past 1 month.  She has been at goal for uric acid since labs in PCP office about 3 months ago.  I recommend she continue colchicine prophylaxis for flares until she remains without attacks for 3 months then can taper the dose.  Long-term goal to be off medication as it can interact with other medications such as statins.  We will check CBC for potential side effect of medication today and then repeat in 3 months.   Orders: Orders Placed This Encounter  Procedures  . CBC with Differential/Platelet   Meds ordered this encounter  Medications  . allopurinol (ZYLOPRIM) 300 MG tablet    Sig: Take 1 tablet (300 mg total) by mouth daily.    Dispense:  90 tablet    Refill:  0  . colchicine 0.6 MG tablet    Sig: Take 1 tablet (0.6 mg total) by mouth daily.    Dispense:  90 tablet    Refill:  0    Follow-Up Instructions: Return in about 3 months (around 09/10/2020).   Fuller Plan, MD  Note - This record has been created using [employer].  Chart creation errors have been sought, but may not always  have been located. Such creation errors do not reflect on  the standard of medical care.

## 2020-06-10 NOTE — Patient Instructions (Signed)
I recommend continue take the allopurinol and colchicine daily.  If you get to being free of gout attacks for 3 months.  I would then recommend trying to reduce the colchicine dose by half.  If you do have attacks come back again I would just stay on the full dose of medication.  We will plan to follow-up in about 12 weeks for repeat blood test and see how well symptoms are controlled.

## 2020-06-12 NOTE — Progress Notes (Signed)
The blood test looks fine no evidence of problems from the medication so no change needed from plan discussed in clinic.

## 2020-07-16 ENCOUNTER — Other Ambulatory Visit: Payer: Self-pay

## 2020-07-16 ENCOUNTER — Ambulatory Visit (INDEPENDENT_AMBULATORY_CARE_PROVIDER_SITE_OTHER): Payer: BC Managed Care – PPO | Admitting: Family Medicine

## 2020-07-16 DIAGNOSIS — Z23 Encounter for immunization: Secondary | ICD-10-CM | POA: Diagnosis not present

## 2020-08-19 ENCOUNTER — Encounter: Payer: Self-pay | Admitting: Internal Medicine

## 2020-08-19 ENCOUNTER — Ambulatory Visit: Payer: BC Managed Care – PPO | Admitting: Internal Medicine

## 2020-08-19 ENCOUNTER — Other Ambulatory Visit: Payer: Self-pay

## 2020-08-19 VITALS — BP 153/98 | HR 79 | Ht 64.5 in | Wt 196.4 lb

## 2020-08-19 DIAGNOSIS — M1A09X0 Idiopathic chronic gout, multiple sites, without tophus (tophi): Secondary | ICD-10-CM

## 2020-08-19 MED ORDER — PREDNISONE 20 MG PO TABS: ORAL_TABLET | ORAL | 0 refills | Status: AC

## 2020-08-19 NOTE — Progress Notes (Signed)
Office Visit Note  Patient: Carmen Brown             Date of Birth: Oct 08, 1940           MRN: [id number]             PCP: Lezlie Lye, Meda Coffee, MD Referring: Lezlie Lye, Meda Coffee, * Visit Date: 08/19/2020   Subjective:  Gout (Patient had a gout attack last week but is currently doing okay. )   History of Present Illness: Carmen Brown is a 80 y.o. female here for follow up of gout currently prescribed allopurinol 300mg  PO daily and colchicine 0.6mg  PO daily. At last visit in October 2021 she had been free of gout episodes for 1 month. She had a flare of symptoms last week that improved after 2 days, but had to take off work on Monday due to pain. Otherwise feeling well.   Review of Systems  Constitutional: Negative for fatigue.  HENT: Negative for mouth sores, mouth dryness and nose dryness.   Eyes: Negative for pain, itching, visual disturbance and dryness.  Respiratory: Negative for cough, hemoptysis, shortness of breath and difficulty breathing.   Cardiovascular: Negative for chest pain, palpitations and swelling in legs/feet.  Gastrointestinal: Negative for abdominal pain, blood in stool, constipation and diarrhea.  Endocrine: Negative for increased urination.  Genitourinary: Negative for painful urination.  Musculoskeletal: Positive for morning stiffness. Negative for arthralgias, joint pain, joint swelling, myalgias, muscle weakness, muscle tenderness and myalgias.  Skin: Negative for color change, rash and redness.  Allergic/Immunologic: Negative for susceptible to infections.  Neurological: Negative for dizziness, numbness, headaches, memory loss and weakness.  Hematological: Negative for swollen glands.  Psychiatric/Behavioral: Positive for sleep disturbance. Negative for confusion.    PMFS History:  Patient Active Problem List   Diagnosis Date Noted   Idiopathic gout of multiple sites 05/21/2020   Generalized osteoarthritis 05/21/2020   SOB (shortness of  breath) 11/25/2019   Fatigue 11/25/2019   Educated about COVID-19 virus infection 08/28/2019   Dyslipidemia 08/20/2018   Medication management 11/20/2017   Hyperlipidemia 11/20/2017   CAD (coronary artery disease) 09/09/2017   Unstable angina (HCC) 09/07/2017   Osteoarthritis of left knee 06/08/2016   Osteoarthritis of right knee 06/08/2016   Bilateral carpal tunnel syndrome 06/08/2016   Degenerative disc disease, lumbar 04/06/2015   Chest pressure 11/05/2012   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  Jan 2019   Cardiomegaly    Cardiomyopathy (HCC)    EF 35% in the distant past but NL EF 2009   Hypertension, benign    systemic   Menopausal syndrome    Obesity    Osteoarthritis of lower leg, localized    Paroxysmal VT (HCC)    Thyroid disease     Family History  Problem Relation Age of Onset   Heart disease Mother 71       MI   Heart disease Father 65       CABG   CAD Sister 65       Stent   Lymphoma Sister    Stroke Maternal Grandmother  Diabetes Maternal Grandfather    Diabetes Maternal Uncle    Past Surgical History:  Procedure Laterality Date   CARPAL TUNNEL RELEASE Right 11/2016   CATARACT EXTRACTION  2020   Per patient   LEFT HEART CATH AND CORONARY ANGIOGRAPHY N/A 09/08/2017   Procedure: LEFT HEART CATH AND CORONARY ANGIOGRAPHY;  Surgeon: Nelva Bush, MD;  Location: [facility];  Service: Cardiovascular;  Laterality: N/A;   MASS EXCISION  03/15/2012   Procedure: MINOR EXCISION OF MASS;  Surgeon: Cammie Sickle., MD;   Location: [facility];  Service: Orthopedics;  Laterality: Right;  debride IP right long finger, excision mucoid cyst   ventricular tacticartia     Social History   Social History Narrative   Catering manager for [employer].  Education: 50yrs.  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 Caffeine 2 cups decaf. (not every day).   Immunization History  Administered Date(s) Administered   Fluad Quad(high Dose 65+) 07/16/2020   Influenza Split 08/28/2011   Influenza, High Dose Seasonal PF 07/19/2018   Influenza,inj,Quad PF,6+ Mos 07/22/2014   Influenza-Unspecified 05/29/2016, 05/15/2017   Moderna Sars-Covid-2 Vaccination 09/13/2019, 10/09/2019, 05/15/2020   Pneumococcal Polysaccharide-23 07/28/2009   Td 07/15/2000, 02/21/2012   Tdap 02/21/2012   Zoster 05/16/2011   Zoster Recombinat (Shingrix) 08/20/2018, 01/31/2019     Objective: Vital Signs: BP (!) 153/98 (BP Location: Right Arm, Patient Position: Sitting, Cuff Size: Normal)    Pulse 79    Ht 5' 4.5" (1.638 m)    Wt 196 lb 6.4 oz (89.1 kg)    BMI 33.19 kg/m    Physical Exam Skin:    General: Skin is warm and dry.     Findings: No rash.  Neurological:     General: No focal deficit present.     Mental Status: She is alert.  Psychiatric:        Mood and Affect: Mood normal.     Musculoskeletal Exam:  Wrist, fingers full range of motion no swelling Knees, ankles, MTPs full range of motion no swelling   Investigation: No additional findings.  Imaging: No results found.  Recent Labs: Lab Results  Component Value Date   WBC 6.7 06/10/2020   HGB 13.9 06/10/2020   PLT 225 06/10/2020   NA 140 04/29/2020   K 4.6 04/29/2020   CL 102 04/29/2020   CO2 25 04/29/2020   GLUCOSE 78 04/29/2020   BUN 13 04/29/2020   CREATININE 0.87 04/29/2020   BILITOT 0.6 02/28/2020   ALKPHOS 94 02/28/2020   AST 21 02/28/2020   ALT 24 02/28/2020   PROT 7.0 02/28/2020   ALBUMIN 4.4 02/28/2020   CALCIUM 9.5 04/29/2020   GFRAA 74 04/29/2020     Speciality Comments: No specialty comments available.  Procedures:  No procedures performed Allergies: Epinephrine and Latex   Assessment / Plan:     Visit Diagnoses: Idiopathic chronic gout of multiple sites without tophus - Plan: Uric acid, predniSONE (DELTASONE) 20 MG tablet  Recent gout flare taking more than 24 hours to improve with colchicine but otherwise tolerating treatment with allopurinol. Will check uric acid to see if at goal today on current dose. Recommended she continue low dose colchicine ppx as well until flare free for 3 months. Rx provided for prednisone taper to take as needed to minimize loss of work due to repeat flares while travelling.  Orders: Orders Placed This Encounter  Procedures   Uric acid   Meds ordered this encounter  Medications   predniSONE (DELTASONE) 20  MG tablet    Sig: Take 3 tablets (60 mg total) by mouth daily with breakfast for 2 days, THEN 2 tablets (40 mg total) daily with breakfast for 2 days, THEN 1 tablet (20 mg total) daily with breakfast for 2 days. For gout flare.    Dispense:  12 tablet    Refill:  0    Follow-Up Instructions: Return in about 3 months (around 11/17/2020).   Fuller Plan, MD  Note - This record has been created using [employer].  Chart creation errors have been sought, but may not always  have been located. Such creation errors do not reflect on  the standard of medical care.

## 2020-08-20 LAB — URIC ACID: Uric Acid, Serum: 4.2 mg/dL (ref 2.5–7.0)

## 2020-08-21 MED ORDER — ALLOPURINOL 300 MG PO TABS: 300.0000 mg | ORAL_TABLET | Freq: Every day | ORAL | 0 refills | Status: DC

## 2020-08-21 MED ORDER — COLCHICINE 0.6 MG PO TABS: 0.6000 mg | ORAL_TABLET | Freq: Every day | ORAL | 0 refills | Status: DC

## 2020-08-21 NOTE — Addendum Note (Signed)
Addended by: Fuller Plan on: 08/21/2020 09:03 PM   Modules accepted: Orders

## 2020-08-21 NOTE — Progress Notes (Signed)
Uric acid 4.2 is at goal on current allopurinol dose, new Rx refill sent to pharmacy and for colchicine, as discussed in clinic.

## 2020-08-26 ENCOUNTER — Other Ambulatory Visit: Payer: Self-pay | Admitting: Internal Medicine

## 2020-08-26 DIAGNOSIS — M1A09X0 Idiopathic chronic gout, multiple sites, without tophus (tophi): Secondary | ICD-10-CM

## 2020-08-26 NOTE — Telephone Encounter (Signed)
Last Visit: 08/19/2020 Next Visit: 11/19/2020 Labs: 08/19/2020 - uric acid: 4.2  Current Dose per office note: Colchicine 0.6mg  PO daily DX: Idiopathic chronic gout of multiple sites without tophus  Okay to refill Colchicine?

## 2020-09-04 ENCOUNTER — Ambulatory Visit: Payer: BC Managed Care – PPO | Admitting: Internal Medicine

## 2020-10-12 DIAGNOSIS — M79642 Pain in left hand: Secondary | ICD-10-CM | POA: Insufficient documentation

## 2020-10-27 ENCOUNTER — Telehealth: Payer: Self-pay | Admitting: Internal Medicine

## 2020-10-27 NOTE — Telephone Encounter (Signed)
Patient is scheduled 11/19/2020- does she need an earlier appointment for any reason?

## 2020-10-27 NOTE — Telephone Encounter (Signed)
Spoke with patient, she had a flare that resolved after 1 day. Patient continued with daily medication- Allopurinol and Colchicine, she did not take Prednisone since symptoms resolved after one day.   Patient had a conflict with previously scheduled appointment, she has been rescheduled for 12/02/2020 at 3:00 pm.

## 2020-10-27 NOTE — Telephone Encounter (Signed)
Patient states doctor wanted to see her back in three months in hopes she did not have a Gout flare in that time. Patient had a flare on 10/05/2020. Patient wants to know if doctor would like to see her sooner since she had a flare? Please call to advise.

## 2020-10-27 NOTE — Telephone Encounter (Signed)
If the gout flare improved already then okay not to see her until next month. Did she take the prednisone as we had discussed as an option, and if so did it help resolve it faster than the colchicine?

## 2020-11-04 ENCOUNTER — Ambulatory Visit: Payer: BC Managed Care – PPO | Admitting: Family Medicine

## 2020-11-04 ENCOUNTER — Other Ambulatory Visit: Payer: Self-pay

## 2020-11-04 ENCOUNTER — Encounter: Payer: Self-pay | Admitting: Family Medicine

## 2020-11-04 VITALS — BP 130/87 | HR 93 | Temp 98.3°F | Ht 64.5 in | Wt 200.0 lb

## 2020-11-04 DIAGNOSIS — Z1211 Encounter for screening for malignant neoplasm of colon: Secondary | ICD-10-CM | POA: Diagnosis not present

## 2020-11-04 DIAGNOSIS — K649 Unspecified hemorrhoids: Secondary | ICD-10-CM

## 2020-11-04 DIAGNOSIS — Z6833 Body mass index (BMI) 33.0-33.9, adult: Secondary | ICD-10-CM

## 2020-11-04 DIAGNOSIS — M545 Low back pain, unspecified: Secondary | ICD-10-CM

## 2020-11-04 NOTE — Progress Notes (Signed)
3/23/202211:44 AM  Carmen Brown 1940/11/14, 80 y.o., female [id number]  Chief Complaint  Patient presents with  . period like cramps    Since Monday , states saw bright red blood in toilet    HPI:   Patient is a 80 y.o. female with past medical history significant for gout who presents today for lower back pain.  Works as a Catering manager Had a BM on Monday noticed blood in her stool Didn't see blood when she wiped Is not sure if this has ever happened before Long history of hemorrhoids Does have issues constipation Has been having lower back pain over the past year Last went to GI 2019 with Dr. Collene Mares Was told needed a colonoscopy, then covid happened and never scheduled [place] Colonoscopy was 2009 Did have abdominal cramping on Monday Does have BM most days Has a long history of lower back pain, has been going on for over a year Since going back to work has increased issues with back pain Sees a chiropractor for this Takes NSAIDs for pain This works well Sees ortho for frequent joint injections Declines loss of bowel or bladder function   Depression screen [facility] 2/9 11/04/2020 02/28/2020 12/26/2019  Decreased Interest 0 0 0  Down, Depressed, Hopeless 0 0 0  PHQ - 2 Score 0 0 0    Fall Risk  11/04/2020 04/23/2020 02/28/2020 12/26/2019 05/09/2018  Falls in the past year? 0 0 0 0 No  Number falls in past yr: 0 0 0 - -  Injury with Fall? 0 0 0 - -  Comment - - - - -  Follow up Falls evaluation completed - Falls evaluation completed Falls evaluation completed -     Allergies  Allergen Reactions  . Epinephrine Other (See Comments)    unknown  . Latex     rash    Prior to Admission medications   Medication Sig Start Date End Date Taking? Authorizing Provider  allopurinol (ZYLOPRIM) 300 MG tablet Take 1 tablet (300 mg total) by mouth daily. 08/21/20   Collier Salina, MD  amLODipine (NORVASC) 2.5 MG tablet Take 2.5 mg twice a day 05/22/20   Minus Breeding, MD   aspirin EC 81 MG tablet Take 81 mg by mouth daily.    [provider]  atorvastatin (LIPITOR) 40 MG tablet TAKE 1 TABLET BY MOUTH EVERY DAY AT 6 PM 05/22/20   Minus Breeding, MD  Biotin 5000 MCG CAPS Take 1 capsule by mouth daily. Takes daily    [provider]  colchicine 0.6 MG tablet TAKE 1 TABLET(0.6 MG) BY MOUTH DAILY 08/26/20   Rice, Resa Miner, MD  fluticasone (FLONASE) 50 MCG/ACT nasal spray Place 1 spray into both nostrils 2 (two) times daily. 10/01/19   Minus Breeding, MD  Glucosamine HCl (GLUCOSAMINE PO) Take by mouth.    [provider]  lisinopril (ZESTRIL) 20 MG tablet Take 1 tablet (20 mg total) by mouth 2 (two) times daily at 10 AM and 5 PM. 06/08/20   Minus Breeding, MD  metoprolol succinate (TOPROL-XL) 25 MG 24 hr tablet TAKE 1 TABLET(25 MG) BY MOUTH DAILY 05/22/20   Minus Breeding, MD  Multiple Vitamin (MULTIVITAMIN) tablet Take 1 tablet by mouth daily.     [provider]  nitroGLYCERIN (NITROSTAT) 0.4 MG SL tablet Place 1 tablet (0.4 mg total) under the tongue every 5 (five) minutes x 3 doses as needed for chest pain. Patient not taking: Reported on 08/19/2020 09/09/17   Bernerd Pho  M, PA-C  Pyridoxine HCl (VITAMIN B-6 PO) Take by mouth.    [provider]  Zoster Vaccine Adjuvanted [facility]) injection Shingrix (PF) 50 mcg/0.5 mL intramuscular suspension, kit Patient not taking: No sig reported    [provider]    Past Medical History:  Diagnosis Date  . Allergy   . Arthritis   . CAD (coronary artery disease)    95% proximal LAD stenosis with DES.  Jan 2019  . Cardiomegaly   . Cardiomyopathy ([place])    EF 35% in the distant past but NL EF 2009  . Hypertension, benign    systemic  . Menopausal syndrome   . Obesity   . Osteoarthritis of lower leg, localized   . Paroxysmal VT ([place])   . Thyroid disease     Past Surgical History:  Procedure Laterality Date  . CARPAL TUNNEL RELEASE Right 11/2016  .  CATARACT EXTRACTION  2020   Per patient  . LEFT HEART CATH AND CORONARY ANGIOGRAPHY N/A 09/08/2017   Procedure: LEFT HEART CATH AND CORONARY ANGIOGRAPHY;  Surgeon: Nelva Bush, MD;  Location: [facility];  Service: Cardiovascular;  Laterality: N/A;  . MASS EXCISION  03/15/2012   Procedure: MINOR EXCISION OF MASS;  Surgeon: Cammie Sickle., MD;  Location: [facility];  Service: Orthopedics;  Laterality: Right;  debride IP right long finger, excision mucoid cyst  . ventricular tacticartia      Social History   Tobacco Use  . Smoking status: Never Smoker  . Smokeless tobacco: Never Used  Substance Use Topics  . Alcohol use: Yes    Comment: Occasionally    Family History  Problem Relation Age of Onset  . Heart disease Mother 62       MI  . Heart disease Father 81       CABG  . CAD Sister 2       Stent  . Lymphoma Sister   . Stroke Maternal Grandmother   . Diabetes Maternal Grandfather   . Diabetes Maternal Uncle     Review of Systems  Constitutional: Negative for chills, fever and malaise/fatigue.  Respiratory: Negative for cough, shortness of breath and wheezing.   Cardiovascular: Negative for chest pain, palpitations and leg swelling.  Gastrointestinal: Positive for abdominal pain, blood in stool and constipation. Negative for diarrhea, heartburn, melena, nausea and vomiting.  Genitourinary: Negative for dysuria, frequency and hematuria.  Musculoskeletal: Positive for back pain and joint pain.  Skin: Negative for rash.  Neurological: Negative for dizziness, weakness and headaches.     OBJECTIVE:  Today's Vitals   11/04/20 1112  BP: 130/87  Pulse: 93  Temp: 98.3 F (36.8 C)  SpO2: 96%  Weight: 200 lb (90.7 kg)  Height: 5' 4.5" (1.638 m)   Body mass index is 33.8 kg/m.   Physical Exam Constitutional:      General: She is not in acute distress.    Appearance: Normal appearance. She is not ill-appearing.  HENT:     Head:  Normocephalic.  Cardiovascular:     Rate and Rhythm: Normal rate and regular rhythm.     Pulses: Normal pulses.     Heart sounds: Normal heart sounds. No murmur heard. No friction rub. No gallop.   Pulmonary:     Effort: Pulmonary effort is normal. No respiratory distress.     Breath sounds: Normal breath sounds. No stridor. No wheezing, rhonchi or rales.  Abdominal:     General: Bowel sounds are normal.  There is no distension.     Palpations: Abdomen is soft. There is no mass.     Tenderness: There is no abdominal tenderness. There is no right CVA tenderness, left CVA tenderness, guarding or rebound.     Hernia: No hernia is present.  Musculoskeletal:     Thoracic back: Normal.     Lumbar back: Normal.     Right lower leg: No edema.     Left lower leg: No edema.  Skin:    General: Skin is warm and dry.  Neurological:     Mental Status: She is alert and oriented to person, place, and time.  Psychiatric:        Mood and Affect: Mood normal.        Behavior: Behavior normal.     No results found for this or any previous visit (from the past 24 hour(s)).  No results found.   ASSESSMENT and PLAN  Problem List Items Addressed This Visit   None   Visit Diagnoses    Colon cancer screening    -  Primary   Relevant Orders   Ambulatory referral to Gastroenterology   Low back pain without sciatica, unspecified back pain laterality, unspecified chronicity       Hemorrhoids, unspecified hemorrhoid type       BMI 33.0-33.9,adult          Plan . Declined PT at this time . Referral to GI placed for colonoscopy . Continue suppository treatment of hemorrhoids . Discussed Prevention of constipation . Discussed NSAID and Tylenol of back pain, continue with conservative treatments . RTC/ED precautions discussed  . Continue to follow up with Ortho and podiatry   Return if symptoms worsen or fail to improve.    Huston Foley Deadra Diggins, FNP-BC [facility] at [place], [place] [address] Ph.  [phone] Fax [phone]

## 2020-11-04 NOTE — Patient Instructions (Addendum)
https://www.cancer.org/cancer/colon-rectal-cancer/causes-risks-prevention/risk-factors.html">  Colorectal Cancer Screening  Colorectal cancer screening is a group of tests that are used to check for colorectal cancer before symptoms develop. Colorectal refers to the colon and rectum. The colon and rectum are located at the end of the digestive tract and carry stool (feces) out of the body. Who should have screening? All adults who are 54-78 years old should have screening. Your health care provider may recommend screening before age 36. You will have tests every 1-10 years, depending on your results and the type of screening test. Screening recommendations for adults who are 49-52 years old vary depending on a person's health. People older than age 34 should no longer get colorectal cancer screening. You may have screening tests starting before age 91, or more often than other people, if you have any of these risk factors:  A personal or family history of colorectal cancer or abnormal growths known as polyps in your colon.  Inflammatory bowel disease, such as ulcerative colitis or Crohn's disease.  A history of having radiation treatment to the abdomen or the area between the hip bones (pelvic area) for cancer.  A type of genetic syndrome that is passed from parent to child (hereditary), such as: ? Lynch syndrome. ? Familial adenomatous polyposis. ? Turcot syndrome. ? Peutz-Jeghers syndrome. ? MUTYH-associated polyposis (MAP).  A personal history of diabetes. Types of tests There are several types of colorectal screening tests. You may have one or more of the following:  Guaiac-based fecal occult blood testing. For this test, a stool sample is checked for hidden (occult) blood, which could be a sign of colorectal cancer.  Fecal immunochemical test (FIT). For this test, a stool sample is checked for blood, which could be a sign of colorectal cancer.  Stool DNA test. For this test, a  stool sample is checked for blood and changes in DNA that could lead to colorectal cancer.  Sigmoidoscopy. During this test, a thin, flexible tube with a camera on the end, called a sigmoidoscope, is used to examine the rectum and the lower colon.  Colonoscopy. During this test, a long, flexible tube with a camera on the end, called a colonoscope, is used to examine the entire colon and rectum. Also, sometimes a tissue sample is taken to be looked at under a microscope (biopsy) or small polyps are removed during this test.  Virtual colonoscopy. Instead of a colonoscope, this type of colonoscopy uses a CT scan to take pictures of the colon and rectum. A CT scan is a type of X-ray that is made using computers. What are the benefits of screening? Screening reduces your risk for colorectal cancer and can help identify cancer at an early stage, when the cancer can be removed or treated more easily. It is common for polyps to form in the lining of the colon, especially as you age. These polyps may be cancerous or become cancerous over time. Screening can identify these polyps. What are the risks of screening? Generally, these are safe tests. However, problems may occur, including:  The need for more tests to confirm results from a stool sample test. Stool sample tests have fewer risks than other types of screening tests.  Being exposed to low levels of radiation, if you had a test involving X-rays. This may slightly increase your cancer risk. The benefit of detecting cancer outweighs the slight increase in risk.  Bleeding, damage to the intestine, or infection caused by a sigmoidoscopy or colonoscopy.  A reaction to medicines  given during a sigmoidoscopy or colonoscopy. Talk with your health care provider to understand your risk for colorectal cancer and to make a screening plan that is right for you. Questions to ask your health care provider  When should I start colorectal cancer screening?  What  is my risk for colorectal cancer?  How often do I need screening?  Which screening tests do I need?  How do I get my test results?  What do my results mean? Where to find more information Learn more about colorectal cancer screening from:  The American Cancer Society: cancer.org  National Cancer Institute: cancer.gov Summary  Colorectal cancer screening is a group of tests used to check for colorectal cancer before symptoms develop.  All adults who are 19-63 years old should have screening. Your health care provider may recommend screening before age 41.  You may have screening tests starting before age 56, or more often than other people, if you have certain risk factors.  Screening reduces your risk for colorectal cancer and can help identify cancer at an early stage, when the cancer can be removed or treated more easily.  Talk with your health care provider to understand your risk for colorectal cancer and to make a screening plan that is right for you. This information is not intended to replace advice given to you by your health care provider. Make sure you discuss any questions you have with your health care provider. Document Revised: 11/20/2019 Document Reviewed: 11/20/2019 Elsevier Patient Education  2021 Elsevier Inc.   Health Maintenance After Age 16 After age 47, you are at a higher risk for certain long-term diseases and infections as well as injuries from falls. Falls are a major cause of broken bones and head injuries in people who are older than age 37. Getting regular preventive care can help to keep you healthy and well. Preventive care includes getting regular testing and making lifestyle changes as recommended by your health care provider. Talk with your health care provider about:  Which screenings and tests you should have. A screening is a test that checks for a disease when you have no symptoms.  A diet and exercise plan that is right for you. What should  I know about screenings and tests to prevent falls? Screening and testing are the best ways to find a health problem early. Early diagnosis and treatment give you the best chance of managing medical conditions that are common after age 63. Certain conditions and lifestyle choices may make you more likely to have a fall. Your health care provider may recommend:  Regular vision checks. Poor vision and conditions such as cataracts can make you more likely to have a fall. If you wear glasses, make sure to get your prescription updated if your vision changes.  Medicine review. Work with your health care provider to regularly review all of the medicines you are taking, including over-the-counter medicines. Ask your health care provider about any side effects that may make you more likely to have a fall. Tell your health care provider if any medicines that you take make you feel dizzy or sleepy.  Osteoporosis screening. Osteoporosis is a condition that causes the bones to get weaker. This can make the bones weak and cause them to break more easily.  Blood pressure screening. Blood pressure changes and medicines to control blood pressure can make you feel dizzy.  Strength and balance checks. Your health care provider may recommend certain tests to check your strength and balance while  standing, walking, or changing positions.  Foot health exam. Foot pain and numbness, as well as not wearing proper footwear, can make you more likely to have a fall.  Depression screening. You may be more likely to have a fall if you have a fear of falling, feel emotionally low, or feel unable to do activities that you used to do.  Alcohol use screening. Using too much alcohol can affect your balance and may make you more likely to have a fall. What actions can I take to lower my risk of falls? General instructions  Talk with your health care provider about your risks for falling. Tell your health care provider if: ? You  fall. Be sure to tell your health care provider about all falls, even ones that seem minor. ? You feel dizzy, sleepy, or off-balance.  Take over-the-counter and prescription medicines only as told by your health care provider. These include any supplements.  Eat a healthy diet and maintain a healthy weight. A healthy diet includes low-fat dairy products, low-fat (lean) meats, and fiber from whole grains, beans, and lots of fruits and vegetables. Home safety  Remove any tripping hazards, such as rugs, cords, and clutter.  Install safety equipment such as grab bars in bathrooms and safety rails on stairs.  Keep rooms and walkways well-lit. Activity  Follow a regular exercise program to stay fit. This will help you maintain your balance. Ask your health care provider what types of exercise are appropriate for you.  If you need a cane or walker, use it as recommended by your health care provider.  Wear supportive shoes that have nonskid soles.   Lifestyle  Do not drink alcohol if your health care provider tells you not to drink.  If you drink alcohol, limit how much you have: ? 0-1 drink a day for women. ? 0-2 drinks a day for men.  Be aware of how much alcohol is in your drink. In the [place] one drink equals one typical bottle of beer (12 oz), one-half glass of wine (5 oz), or one shot of hard liquor (1 oz).  Do not use any products that contain nicotine or tobacco, such as cigarettes and e-cigarettes. If you need help quitting, ask your health care provider. Summary  Having a healthy lifestyle and getting preventive care can help to protect your health and wellness after age 34.  Screening and testing are the best way to find a health problem early and help you avoid having a fall. Early diagnosis and treatment give you the best chance for managing medical conditions that are more common for people who are older than age 56.  Falls are a major cause of broken bones and head  injuries in people who are older than age 64. Take precautions to prevent a fall at home.  Work with your health care provider to learn what changes you can make to improve your health and wellness and to prevent falls. This information is not intended to replace advice given to you by your health care provider. Make sure you discuss any questions you have with your health care provider. Document Revised: 11/22/2018 Document Reviewed: 06/14/2017 Elsevier Patient Education  2021 [employer].   If you have lab work done today you will be contacted with your lab results within the next 2 weeks.  If you have not heard from [place] then please contact us. The fastest way to get your results is to register for My Chart.   IF you  received an x-ray today, you will receive an invoice from [facility]. Please contact [facility] at [phone] with questions or concerns regarding your invoice.   IF you received labwork today, you will receive an invoice from [facility]. Please contact [facility] at [phone] with questions or concerns regarding your invoice.   Our billing staff will not be able to assist you with questions regarding bills from these companies.  You will be contacted with the lab results as soon as they are available. The fastest way to get your results is to activate your My Chart account. Instructions are located on the last page of this paperwork. If you have not heard from [place] regarding the results in 2 weeks, please contact this office.

## 2020-11-17 ENCOUNTER — Other Ambulatory Visit: Payer: Self-pay | Admitting: Internal Medicine

## 2020-11-17 DIAGNOSIS — M1A09X0 Idiopathic chronic gout, multiple sites, without tophus (tophi): Secondary | ICD-10-CM

## 2020-11-19 ENCOUNTER — Ambulatory Visit: Payer: BC Managed Care – PPO | Admitting: Internal Medicine

## 2020-11-23 ENCOUNTER — Other Ambulatory Visit: Payer: Self-pay | Admitting: Internal Medicine

## 2020-11-23 DIAGNOSIS — M1A09X0 Idiopathic chronic gout, multiple sites, without tophus (tophi): Secondary | ICD-10-CM

## 2020-12-02 ENCOUNTER — Ambulatory Visit: Payer: BC Managed Care – PPO | Admitting: Internal Medicine

## 2020-12-22 ENCOUNTER — Other Ambulatory Visit: Payer: Self-pay | Admitting: Cardiology

## 2021-01-01 ENCOUNTER — Other Ambulatory Visit: Payer: Self-pay | Admitting: Cardiology

## 2021-01-01 DIAGNOSIS — I10 Essential (primary) hypertension: Secondary | ICD-10-CM

## 2021-01-07 ENCOUNTER — Ambulatory Visit: Payer: BC Managed Care – PPO | Admitting: Internal Medicine

## 2021-01-13 ENCOUNTER — Other Ambulatory Visit: Payer: Self-pay

## 2021-01-13 ENCOUNTER — Ambulatory Visit: Payer: BC Managed Care – PPO | Admitting: Internal Medicine

## 2021-01-13 ENCOUNTER — Encounter: Payer: Self-pay | Admitting: Internal Medicine

## 2021-01-13 VITALS — BP 121/78 | HR 88 | Wt 204.0 lb

## 2021-01-13 DIAGNOSIS — M1A09X0 Idiopathic chronic gout, multiple sites, without tophus (tophi): Secondary | ICD-10-CM

## 2021-01-13 MED ORDER — PREDNISONE 20 MG PO TABS: ORAL_TABLET | ORAL | 0 refills | Status: AC

## 2021-01-13 MED ORDER — ALLOPURINOL 300 MG PO TABS: 300.0000 mg | ORAL_TABLET | Freq: Every day | ORAL | 1 refills | Status: DC

## 2021-01-13 MED ORDER — COLCHICINE 0.6 MG PO TABS: 0.6000 mg | ORAL_TABLET | ORAL | 1 refills | Status: DC

## 2021-01-13 NOTE — Progress Notes (Signed)
Office Visit Note  Patient: Carmen Brown             Date of Birth: 1941-07-15           MRN: [id number]             PCP: Lezlie Lye, Meda Coffee, MD Referring: Lezlie Lye, Meda Coffee, * Visit Date: 01/13/2021   Subjective:  Follow-up (Patient's gout has been well-controlled. )   History of Present Illness: Carmen Brown is a 80 y.o. female here for follow up for gout currently on allopurinol 300 mg p.o. daily and colchicine 0.6 mg daily.  Symptoms are doing pretty well she has not experienced any significant flare or attacks since the last visit.  She has noticed some increased weight gain attributes this to a lot of difficulty maintaining a good or balanced diet while constantly traveling.  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   Review of Systems  Constitutional:  Positive for fatigue.  HENT:  Negative for mouth sores, mouth dryness and nose dryness.   Eyes:  Negative for pain, itching, visual disturbance and dryness.  Respiratory:  Negative for cough, hemoptysis, shortness of breath and difficulty breathing.   Cardiovascular:  Negative for chest pain, palpitations and swelling in legs/feet.  Gastrointestinal:  Negative for abdominal pain, blood in stool, constipation and diarrhea.  Endocrine: Negative for increased urination.  Genitourinary:  Negative for painful urination.  Musculoskeletal:  Positive for joint pain, joint pain, joint swelling and morning stiffness. Negative for myalgias, muscle weakness, muscle tenderness and myalgias.  Skin:  Negative for color change, rash and redness.  Allergic/Immunologic: Negative for susceptible to infections.  Neurological:  Positive for weakness. Negative for dizziness, numbness, headaches and memory loss.  Hematological:  Negative for swollen glands.  Psychiatric/Behavioral:   Positive for sleep disturbance. Negative for confusion.    PMFS History:  Patient Active Problem List   Diagnosis Date Noted   Abnormal weight gain 01/22/2021   Colon cancer screening 01/22/2021   Pain of left hand 10/12/2020   Idiopathic gout of multiple sites 05/21/2020   Generalized osteoarthritis 05/21/2020   SOB (shortness of breath) 11/25/2019   Fatigue 11/25/2019   Educated about COVID-19 virus infection 08/28/2019   Dyslipidemia 08/20/2018   Medication management 11/20/2017   Hyperlipidemia 11/20/2017   CAD (coronary artery disease) 09/09/2017   Unstable angina (HCC) 09/07/2017   Osteoarthritis of left knee 06/08/2016   Osteoarthritis of right knee 06/08/2016   Bilateral carpal tunnel syndrome 06/08/2016   Degenerative disc disease, lumbar 04/06/2015   Chest pressure 11/05/2012   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  Jan 2019   Cardiomegaly    Cardiomyopathy (HCC)    EF 35% in the distant past but NL EF 2009   Hypertension, benign    systemic   Menopausal syndrome    Obesity    Osteoarthritis of lower leg, localized    Paroxysmal VT (HCC)  Thyroid disease     Family History  Problem Relation Age of Onset   Heart disease Mother 63       MI   Heart disease Father 20       CABG   CAD Sister 60       Stent   Lymphoma Sister    Stroke Maternal Grandmother    Diabetes Maternal Grandfather    Diabetes Maternal Uncle    Past Surgical History:  Procedure Laterality Date   CARPAL TUNNEL RELEASE Right 11/2016   CATARACT EXTRACTION  2020   Per patient   LEFT HEART CATH AND  CORONARY ANGIOGRAPHY N/A 09/08/2017   Procedure: LEFT HEART CATH AND CORONARY ANGIOGRAPHY;  Surgeon: Yvonne Kendall, MD;  Location: [facility];  Service: Cardiovascular;  Laterality: N/A;   MASS EXCISION  03/15/2012   Procedure: MINOR EXCISION OF MASS;  Surgeon: Wyn Forster., MD;  Location: [facility];  Service: Orthopedics;  Laterality: Right;  debride IP right long finger, excision mucoid cyst   ventricular tacticartia     Social History   Social History Narrative   Financial controller for [employer].  Education: 43yrs.  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 Caffeine 2 cups decaf. (not every day).   Immunization History  Administered Date(s) Administered   Fluad Quad(high Dose 65+) 07/16/2020   Influenza Split 08/28/2011   Influenza, High Dose Seasonal PF 07/19/2018   Influenza,inj,Quad PF,6+ Mos 07/22/2014   Influenza-Unspecified 05/29/2016, 05/15/2017   Moderna Sars-Covid-2 Vaccination 09/13/2019, 10/09/2019, 05/15/2020   Pneumococcal Polysaccharide-23 07/28/2009   Td 07/15/2000, 02/21/2012   Tdap 02/21/2012   Zoster Recombinat (Shingrix) 08/20/2018, 01/31/2019   Zoster, Live 05/16/2011     Objective: Vital Signs: BP 121/78 (BP Location: Left Arm, Patient Position: Sitting, Cuff Size: Normal)   Pulse 88   Wt 204 lb (92.5 kg)   BMI 34.48 kg/m    Physical Exam Skin:    General: Skin is warm and dry.     Findings: No rash.  Neurological:     General: No focal deficit present.     Mental Status: She is alert.  Psychiatric:        Mood and Affect: Mood normal.     Musculoskeletal Exam:  Wrists full ROM no tenderness or swelling Fingers full ROM no tenderness or swelling Knees full ROM no tenderness or swelling Ankles full ROM no tenderness or swelling MTPs full ROM no tenderness or swelling   Investigation: No additional findings.  Imaging: No results found.  Recent Labs: Lab Results  Component Value Date   WBC 4.9 01/13/2021   HGB 12.9 01/13/2021   PLT 209  01/13/2021   NA 140 01/13/2021   K 4.2 01/13/2021   CL 107 01/13/2021   CO2 25 01/13/2021   GLUCOSE 112 (H) 01/13/2021   BUN 16 01/13/2021   CREATININE 0.87 01/13/2021   BILITOT 0.5 01/13/2021   ALKPHOS 94 02/28/2020   AST 41 (H) 01/13/2021   ALT 44 (H) 01/13/2021   PROT 6.2 01/13/2021   ALBUMIN 4.4 02/28/2020   CALCIUM 8.9 01/13/2021   GFRAA 74 01/13/2021    Speciality Comments: No specialty comments available.  Procedures:  No procedures performed Allergies: Epinephrine and Latex   Assessment / Plan:     Visit Diagnoses: Idiopathic chronic gout of multiple sites without tophus - Plan: colchicine 0.6 MG tablet, allopurinol (ZYLOPRIM) 300 MG tablet, predniSONE (DELTASONE) 20 MG tablet, CBC with Differential/Platelet, COMPLETE METABOLIC PANEL WITH GFR, Uric acid  Chronic  gout but appears well controlled currently she has not had major attack for several months.  Discussed could often try discontinuing the colchicine and continuing only allopurinol for urate lowering therapy should not be at high risk for flare.  She is very concerned about a return of gout symptoms as they are very debilitating and she is resuming traveling internationally without risk.  We will try decreasing colchicine to 1 tablet every other day this should reduce any toxicity risks at least partially as well.  Also prescribe future as needed standing order for short prednisone taper in case she needs this available for acute flare while away.  We will recheck uric acid to make sure this remains at goal.  Rechecking CBC and CMP today for medication monitoring on the daily colchicine and allopurinol  Orders: Orders Placed This Encounter  Procedures   CBC with Differential/Platelet   COMPLETE METABOLIC PANEL WITH GFR   Uric acid   Meds ordered this encounter  Medications   colchicine 0.6 MG tablet    Sig: Take 1 tablet (0.6 mg total) by mouth every other day.    Dispense:  90 tablet    Refill:  1    allopurinol (ZYLOPRIM) 300 MG tablet    Sig: Take 1 tablet (300 mg total) by mouth daily.    Dispense:  90 tablet    Refill:  1   predniSONE (DELTASONE) 20 MG tablet    Sig: Take 3 tablets (60 mg total) by mouth daily with breakfast for 2 days, THEN 2 tablets (40 mg total) daily with breakfast for 2 days, THEN 1 tablet (20 mg total) daily with breakfast for 2 days. As needed for gout flare.    Dispense:  12 tablet    Refill:  0     Follow-Up Instructions: No follow-ups on file.   Fuller Plan, MD  Note - This record has been created using [employer].  Chart creation errors have been sought, but may not always  have been located. Such creation errors do not reflect on  the standard of medical care.

## 2021-01-14 LAB — COMPLETE METABOLIC PANEL WITHOUT GFR
AG Ratio: 1.8 (calc) (ref 1.0–2.5)
ALT: 44 U/L — ABNORMAL HIGH (ref 6–29)
AST: 41 U/L — ABNORMAL HIGH (ref 10–35)
Albumin: 4 g/dL (ref 3.6–5.1)
Alkaline phosphatase (APISO): 90 U/L (ref 37–153)
BUN: 16 mg/dL (ref 7–25)
CO2: 25 mmol/L (ref 20–32)
Calcium: 8.9 mg/dL (ref 8.6–10.4)
Chloride: 107 mmol/L (ref 98–110)
Creat: 0.87 mg/dL (ref 0.60–0.93)
GFR, Est African American: 74 mL/min/{1.73_m2}
GFR, Est Non African American: 64 mL/min/{1.73_m2}
Globulin: 2.2 g/dL (ref 1.9–3.7)
Glucose, Bld: 112 mg/dL — ABNORMAL HIGH (ref 65–99)
Potassium: 4.2 mmol/L (ref 3.5–5.3)
Sodium: 140 mmol/L (ref 135–146)
Total Bilirubin: 0.5 mg/dL (ref 0.2–1.2)
Total Protein: 6.2 g/dL (ref 6.1–8.1)

## 2021-01-14 LAB — CBC WITH DIFFERENTIAL/PLATELET
Absolute Monocytes: 686 cells/uL (ref 200–950)
Basophils Absolute: 39 cells/uL (ref 0–200)
Basophils Relative: 0.8 %
Eosinophils Absolute: 260 cells/uL (ref 15–500)
Eosinophils Relative: 5.3 %
HCT: 38.5 % (ref 35.0–45.0)
Hemoglobin: 12.9 g/dL (ref 11.7–15.5)
Lymphs Abs: 1715 cells/uL (ref 850–3900)
MCH: 32.7 pg (ref 27.0–33.0)
MCHC: 33.5 g/dL (ref 32.0–36.0)
MCV: 97.5 fL (ref 80.0–100.0)
MPV: 11.4 fL (ref 7.5–12.5)
Monocytes Relative: 14 %
Neutro Abs: 2200 cells/uL (ref 1500–7800)
Neutrophils Relative %: 44.9 %
Platelets: 209 10*3/uL (ref 140–400)
RBC: 3.95 10*6/uL (ref 3.80–5.10)
RDW: 12.5 % (ref 11.0–15.0)
Total Lymphocyte: 35 %
WBC: 4.9 10*3/uL (ref 3.8–10.8)

## 2021-01-14 LAB — URIC ACID: Uric Acid, Serum: 4.7 mg/dL (ref 2.5–7.0)

## 2021-01-22 DIAGNOSIS — Z1211 Encounter for screening for malignant neoplasm of colon: Secondary | ICD-10-CM | POA: Insufficient documentation

## 2021-01-22 DIAGNOSIS — R635 Abnormal weight gain: Secondary | ICD-10-CM | POA: Insufficient documentation

## 2021-01-24 NOTE — Progress Notes (Signed)
Blood tests show mild increase in liver enzymes. This can be related to medication use but if so decreasing colchicine to every other day should help. Change is extremely small, we can recheck for this at future follow up.

## 2021-01-25 ENCOUNTER — Telehealth: Payer: BC Managed Care – PPO | Admitting: Physician Assistant

## 2021-01-25 ENCOUNTER — Telehealth: Payer: Self-pay | Admitting: *Deleted

## 2021-01-25 ENCOUNTER — Telehealth: Payer: Self-pay | Admitting: Cardiology

## 2021-01-25 ENCOUNTER — Encounter: Payer: Self-pay | Admitting: Physician Assistant

## 2021-01-25 DIAGNOSIS — U071 COVID-19: Secondary | ICD-10-CM | POA: Diagnosis not present

## 2021-01-25 MED ORDER — MOLNUPIRAVIR EUA 200MG CAPSULE: 4.0000 | ORAL_CAPSULE | Freq: Two times a day (BID) | ORAL | 0 refills | Status: AC

## 2021-01-25 NOTE — Patient Instructions (Signed)
Hello Carmen Brown,  You are being placed in the home monitoring program for COVID-19 (commonly known as Coronavirus).  This is because you are suspected to have the virus or are known to have the virus.  If you are unsure which group you fall into call your clinic.    As part of this program, you'll answer a daily questionnaire in the MyChart mobile app. You'll receive a notification through the MyChart app when the questionnaire is available. When you log in to MyChart, you'll see the tasks in your To Do activity.       Clinicians will see any answers that are concerning and take appropriate steps.  If at any point you are having a medical emergency, call 911.  If otherwise concerned call your clinic instead of coming into the clinic or hospital.  To keep from spreading the disease you should: Stay home and limit contact with other people as much as possible.  Wash your hands frequently. Cover your coughs and sneezes with a tissue, and throw used tissues in the trash.   Clean and disinfect frequently touched surfaces and objects.    Take care of yourself by: Staying home Resting Drinking fluids Take fever-reducing medications (Tylenol/Acetaminophen and Ibuprofen)  For more information on the disease go to the Centers for Disease Control and Prevention website     You are being prescribed MOLNUPIRAVIR for COVID-19 infection.   Please pick up your prescription at: [facility] pharmacy called into [facility] [place], [place]   Please call the pharmacy or go through the drive through vs going inside if you are picking up the mediation yourself to prevent further spread. If prescribed to a [facility] affiliated pharmacy, a pharmacist will bring the medication out to your car.   ADMINISTRATION INSTRUCTIONS: Take with or without food. Swallow the tablets whole. Don't chew, crush, or break the medications because it might not work as well  For each dose of the medication, you should be taking  FOUR tablets at one time, TWICE a day   Finish your full five-day course of Molnupiravir even if you feel better before you're done. Stopping this medication too early can make it less effective to prevent severe illness related to COVID19.    Molnupiravir is prescribed for YOU ONLY. Don't share it with others, even if they have similar symptoms as you. This medication might not be right for everyone.   Make sure to take steps to protect yourself and others while you're taking this medication in order to get well soon and to prevent others from getting sick with COVID-19.   **If you are of childbearing potential (any gender) - it is advised to not get pregnant while taking this medication and recommended that condoms are used for female partners the next 3 months after taking the medication out of extreme caution    COMMON SIDE EFFECTS: Diarrhea Nausea  Dizziness    If your COVID-19 symptoms get worse, get medical help right away. Call 911 if you experience symptoms such as worsening cough, trouble breathing, chest pain that doesn't go away, confusion, a hard time staying awake, and pale or blue-colored skin. This medication won't prevent all COVID-19 cases from getting worse.   Can take to lessen severity: Vit C 500mg  twice daily Quercertin 250-500mg  twice daily Zinc 75-100mg  daily Melatonin 3-6 mg at bedtime Vit D3 1000-2000 IU daily Aspirin 81 mg daily with food Optional: Famotidine 20mg  daily Also can add tylenol/ibuprofen as needed for fevers and body aches  May add Mucinex or Mucinex DM as needed for cough/congestion  10 Things You Can Do to Manage Your COVID-19 Symptoms at Home If you have possible or confirmed COVID-19 Stay home except to get medical care. Monitor your symptoms carefully. If your symptoms get worse, call your healthcare provider immediately. Get rest and stay hydrated. If you have a medical appointment, call the healthcare provider ahead of time and tell  them that you have or may have COVID-19. For medical emergencies, call 911 and notify the dispatch personnel that you have or may have COVID-19. Cover your cough and sneezes with a tissue or use the inside of your elbow. Wash your hands often with soap and water for at least 20 seconds or clean your hands with an alcohol-based hand sanitizer that contains at least 60% alcohol. As much as possible, stay in a specific room and away from other people in your home. Also, you should use a separate bathroom, if available. If you need to be around other people in or outside of the home, wear a mask. Avoid sharing personal items with other people in your household, like dishes, towels, and bedding. Clean all surfaces that are touched often, like counters, tabletops, and doorknobs. Use household cleaning sprays or wipes according to the label instructions. [email or web address] 02/28/2020 This information is not intended to replace advice given to you by your health care provider. Make sure you discuss any questions you have with your healthcare provider. Document Revised: 09/18/2020 Document Reviewed: 09/18/2020 Elsevier Patient Education  2022 [place]: What to Do if You Are Sick CDC has updated isolation and quarantine recommendations for the public, and is revising the CDC website to reflect these changes. These recommendations do not apply to healthcare personnel and do not supersede state, local, tribal, or territorial laws, rules, andregulations. If you have a fever, cough or other symptoms, you might have COVID-19. Most people have mild illness and are able to recover at home. If you are sick: Keep track of your symptoms. If you have an emergency warning sign (including trouble breathing), call 911. Steps to help prevent the spread of COVID-19 if you are sick If you are sick with COVID-19 or think you might have COVID-19, follow the steps below to care for yourself and to help  protect other peoplein your home and community. Stay home except to get medical care Stay home. Most people with COVID-19 have mild illness and can recover at home without medical care. Do not leave your home, except to get medical care. Do not visit public areas. Take care of yourself. Get rest and stay hydrated. Take over-the-counter medicines, such as acetaminophen, to help you feel better. Stay in touch with your doctor. Call before you get medical care. Be sure to get care if you have trouble breathing, or have any other emergency warning signs, or if you think it is an emergency. Avoid public transportation, ride-sharing, or taxis. Separate yourself from other people As much as possible, stay in a specific room and away from other people and pets in your home. If possible, you should use a separate bathroom. If you need to be around other people or animals in oroutside of the home, wear a mask. Tell your close contactsthat they may have been exposed to COVID-19. An infected person can spread COVID-19 starting 48 hours (or 2 days) before the person has any symptoms or tests positive. By letting your close contacts know they may have been exposed to COVID-19,  you are helping to protect everyone. Additional guidance is available for those living in close quarters and shared housing. See COVID-19 and Animals if you have questions about pets. If you are diagnosed with COVID-19, someone from the health department may call you. Answer the call to slow the spread. Monitor your symptoms Symptoms of COVID-19 include fever, cough, or other symptoms. Follow care instructions from your healthcare provider and local health department. Your local health authorities may give instructions on checking your symptoms and reporting information. When to seek emergency medical attention Look for emergency warning signs* for COVID-19. If someone is showing any of these signs, seek emergency medical care  immediately: Trouble breathing Persistent pain or pressure in the chest New confusion Inability to wake or stay awake Pale, gray, or blue-colored skin, lips, or nail beds, depending on skin tone *This list is not all possible symptoms. Please call your medical provider forany other symptoms that are severe or concerning to you. Call 911 or call ahead to your local emergency facility: Notify the operator that you are seeking care for someone who has or may haveCOVID-19. Call ahead before visiting your doctor Call ahead. Many medical visits for routine care are being postponed or done by phone or telemedicine. If you have a medical appointment that cannot be postponed, call your doctor's office, and tell them you have or may have COVID-19. This will help the office protect themselves and other patients. Get tested If you have symptoms of COVID-19, get tested. While waiting for test results, you stay away from others, including staying apart from those living in your household. Self-tests are one of several options for testing for the virus that causes COVID-19 and may be more convenient than laboratory-based tests and point-of-care tests. Ask your healthcare provider or your local health department if you need help interpreting your test results. You can visit your state, tribal, local, and territorial health department's website to look for the latest local information on testing sites. If you are sick, wear a mask over your nose and mouth You should wear a mask over your nose and mouth if you must be around other people or animals, including pets (even at home). You don't need to wear the mask if you are alone. If you can't put on a mask (because of trouble breathing, for example), cover your coughs and sneezes in some other way. Try to stay at least 6 feet away from other people. This will help protect the people around you. Masks should not be placed on young children under age 105 years, anyone  who has trouble breathing, or anyone who is not able to remove the mask without help. Note: During the COVID-19 pandemic, medical grade facemasks are reserved forhealthcare workers and some first responders. Cover your coughs and sneezes Cover your mouth and nose with a tissue when you cough or sneeze. Throw away used tissues in a lined trash can. Immediately wash your hands with soap and water for at least 20 seconds. If soap and water are not available, clean your hands with an alcohol-based hand sanitizer that contains at least 60% alcohol. Clean your hands often Wash your hands often with soap and water for at least 20 seconds. This is especially important after blowing your nose, coughing, or sneezing; going to the bathroom; and before eating or preparing food. Use hand sanitizer if soap and water are not available. Use an alcohol-based hand sanitizer with at least 60% alcohol, covering all surfaces of your hands and  rubbing them together until they feel dry. Soap and water are the best option, especially if hands are visibly dirty. Avoid touching your eyes, nose, and mouth with unwashed hands. Handwashing Tips Avoid sharing personal household items Do not share dishes, drinking glasses, cups, eating utensils, towels, or bedding with other people in your home. Wash these items thoroughly after using them with soap and water or put in the dishwasher. Clean all "high-touch" surfaces every day Clean and disinfect high-touch surfaces in your "sick room" and bathroom; wear disposable gloves. Let someone else clean and disinfect surfaces in common areas, but you should clean your bedroom and bathroom, if possible. If a caregiver or other person needs to clean and disinfect a sick person's bedroom or bathroom, they should do so on an as-needed basis. The caregiver/other person should wear a mask and disposable gloves prior to cleaning. They should wait as long as possible after the person who is sick  has used the bathroom before coming in to clean and use the bathroom. High-touch surfaces include phones, remote controls, counters, tabletops, doorknobs, bathroom fixtures, toilets, keyboards, tablets, and bedside tables. Clean and disinfect areas that may have blood, stool, or body fluids on them. Use household cleaners and disinfectants. Clean the area or item with soap and water or another detergent if it is dirty. Then, use a household disinfectant. Be sure to follow the instructions on the label to ensure safe and effective use of the product. Many products recommend keeping the surface wet for several minutes to ensure germs are killed. Many also recommend precautions such as wearing gloves and making sure you have good ventilation during use of the product. Use a product from [employer] List N: Disinfectants for Coronavirus (MMNOT-77). Complete Disinfection Guidance When you can be around others after being sick with COVID-19 Deciding when you can be around others is different for different situations. Find out when you can safely end home isolation. For any additional questions about your care,contact your healthcare provider or state or local health department. 07/22/2020 Content source: [facility] for Immunization and Respiratory Diseases ([facility]), Division of Viral Diseases This information is not intended to replace advice given to you by your health care provider. Make sure you discuss any questions you have with your healthcare provider. Document Revised: 09/18/2020 Document Reviewed: 09/18/2020 Elsevier Patient Education  [place].

## 2021-01-25 NOTE — Progress Notes (Signed)
Carmen Brown, Carmen Brown are scheduled for a virtual visit with your provider today.    Just as we do with appointments in the office, we must obtain your consent to participate.  Your consent will be active for this visit and any virtual visit you may have with one of our providers in the next 365 days.    If you have a MyChart account, I can also send a copy of this consent to you electronically.  All virtual visits are billed to your insurance company just like a traditional visit in the office.  As this is a virtual visit, video technology does not allow for your provider to perform a traditional examination.  This may limit your provider's ability to fully assess your condition.  If your provider identifies any concerns that need to be evaluated in person or the need to arrange testing such as labs, EKG, etc, we will make arrangements to do so.    Although advances in technology are sophisticated, we cannot ensure that it will always work on either your end or our end.  If the connection with a video visit is poor, we may have to switch to a telephone visit.  With either a video or telephone visit, we are not always able to ensure that we have a secure connection.   I need to obtain your verbal consent now.   Are you willing to proceed with your visit today?   Carmen Brown has provided verbal consent on 01/25/2021 for a virtual visit (video or telephone).   Mar Daring, PA-C 01/25/2021  10:41 AM    MyChart Video Visit    Virtual Visit via Video Note   This visit type was conducted due to national recommendations for restrictions regarding the COVID-19 Pandemic (e.g. social distancing) in an effort to limit this patient's exposure and mitigate transmission in our community. This patient is at least at moderate risk for complications without adequate follow up. This format is felt to be most appropriate for this patient at this time. Physical exam was limited by quality of the video and audio  technology used for the visit.   Patient location: Home Provider location: Home office in [place]  I discussed the limitations of evaluation and management by telemedicine and the availability of in person appointments. The patient expressed understanding and agreed to proceed.  Patient: Carmen Brown   DOB: 1940-09-30   80 y.o. Female  MRN: [id number] Visit Date: 01/25/2021  Today's healthcare provider: Mar Daring, PA-C   No chief complaint on file.  Subjective    URI  This is a new problem. Episode onset: Symptoms originally started over a few weeks ago but only lasted 4-5 days and felt better, then over a week ago symptoms started and tested negative, but then Friday 01/22/21 symptoms changed and became more progressive. The problem has been gradually worsening. There has been no fever. Associated symptoms include congestion, coughing, headaches, rhinorrhea, sinus pain and a sore throat. Pertinent negatives include no chest pain or wheezing. She has tried acetaminophen, decongestant, increased fluids and sleep for the symptoms. The treatment provided no relief.   Tested positive for Covid 19 yesterday, 01/24/21.  Patient Active Problem List   Diagnosis Date Noted   Abnormal weight gain 01/22/2021   Colon cancer screening 01/22/2021   Pain of left hand 10/12/2020   Idiopathic gout of multiple sites 05/21/2020   Generalized osteoarthritis 05/21/2020   SOB (shortness of breath) 11/25/2019   Fatigue 11/25/2019  Educated about COVID-19 virus infection 08/28/2019   Dyslipidemia 08/20/2018   Medication management 11/20/2017   Hyperlipidemia 11/20/2017   CAD (coronary artery disease) 09/09/2017   Unstable angina ([place]) 09/07/2017   Osteoarthritis of left knee 06/08/2016   Osteoarthritis of right knee 06/08/2016   Bilateral carpal tunnel syndrome 06/08/2016   Degenerative disc disease, lumbar 04/06/2015   Chest pressure 11/05/2012   PAROXYSMAL VENTRICULAR TACHYCARDIA  08/21/2009   OBESITY, NOS 10/12/2006   HYPERTENSION, BENIGN SYSTEMIC 10/12/2006   CARDIOMEGALY 10/12/2006   RHINITIS, ALLERGIC 10/12/2006   MENOPAUSAL SYNDROME 10/12/2006   OSTEOARTHRITIS, LOWER LEG 10/12/2006   CERVICAL SPINE DISORDER, NOS 10/12/2006   Past Medical History:  Diagnosis Date   Allergy    Arthritis    CAD (coronary artery disease)    95% proximal LAD stenosis with DES.  Jan 2019   Cardiomegaly    Cardiomyopathy ([place])    EF 35% in the distant past but NL EF 2009   Hypertension, benign    systemic   Menopausal syndrome    Obesity    Osteoarthritis of lower leg, localized    Paroxysmal VT ([facility])    Thyroid disease       Medications: Outpatient Medications Prior to Visit  Medication Sig   allopurinol (ZYLOPRIM) 300 MG tablet Take 1 tablet (300 mg total) by mouth daily.   amLODipine (NORVASC) 2.5 MG tablet TAKE 1 TABLET BY MOUTH TWICE DAILY   Ascorbic Acid (VITAMIN C) 1000 MG tablet Take 1,000 mg by mouth 2 (two) times daily.   aspirin EC 81 MG tablet Take 81 mg by mouth daily.   atorvastatin (LIPITOR) 40 MG tablet TAKE 1 TABLET BY MOUTH EVERY DAY AT 6 PM   B Complex Vitamins (B-COMPLEX/B-12 PO) Take by mouth.   Biotin 5000 MCG CAPS Take 1 capsule by mouth daily. Takes daily   colchicine 0.6 MG tablet Take 1 tablet (0.6 mg total) by mouth every other day.   fluticasone (FLONASE) 50 MCG/ACT nasal spray Place 1 spray into both nostrils 2 (two) times daily.   Glucosamine HCl (GLUCOSAMINE PO) Take by mouth.   lisinopril (ZESTRIL) 20 MG tablet Take 1 tablet (20 mg total) by mouth 2 (two) times daily at 10 AM and 5 PM.   loratadine (CLARITIN) 10 MG tablet Take 10 mg by mouth daily.   methocarbamol (ROBAXIN) 500 MG tablet Take 1 tablet by mouth at bedtime as needed.   metoprolol succinate (TOPROL-XL) 25 MG 24 hr tablet TAKE 1 TABLET(25 MG) BY MOUTH DAILY   Multiple Vitamin (MULTIVITAMIN) tablet Take 1 tablet by mouth daily.   nitroGLYCERIN (NITROSTAT) 0.4 MG SL tablet  Place 1 tablet (0.4 mg total) under the tongue every 5 (five) minutes x 3 doses as needed for chest pain.   Pyridoxine HCl (VITAMIN B-6 PO) Take by mouth. (Patient not taking: Reported on 01/13/2021)   Zoster Vaccine Adjuvanted [facility]) injection Shingrix (PF) 50 mcg/0.5 mL intramuscular suspension, kit (Patient not taking: Reported on 01/13/2021)   No facility-administered medications prior to visit.    Review of Systems  Constitutional:  Positive for chills. Negative for fatigue and fever.  HENT:  Positive for congestion, rhinorrhea, sinus pain and sore throat.   Respiratory:  Positive for cough. Negative for chest tightness and wheezing.   Cardiovascular:  Negative for chest pain.  Neurological:  Positive for headaches. Negative for dizziness.   Last CBC Lab Results  Component Value Date   WBC 4.9 01/13/2021   HGB 12.9 01/13/2021  HCT 38.5 01/13/2021   MCV 97.5 01/13/2021   MCH 32.7 01/13/2021   RDW 12.5 01/13/2021   PLT 209 49/44/9675   Last metabolic panel Lab Results  Component Value Date   GLUCOSE 112 (H) 01/13/2021   NA 140 01/13/2021   K 4.2 01/13/2021   CL 107 01/13/2021   CO2 25 01/13/2021   BUN 16 01/13/2021   CREATININE 0.87 01/13/2021   GFRNONAA 64 01/13/2021   GFRAA 74 01/13/2021   CALCIUM 8.9 01/13/2021   PROT 6.2 01/13/2021   ALBUMIN 4.4 02/28/2020   LABGLOB 2.6 02/28/2020   AGRATIO 1.7 02/28/2020   BILITOT 0.5 01/13/2021   ALKPHOS 94 02/28/2020   AST 41 (H) 01/13/2021   ALT 44 (H) 01/13/2021   ANIONGAP 7 05/15/2018      Objective    There were no vitals taken for this visit. BP Readings from Last 3 Encounters:  01/13/21 121/78  11/04/20 130/87  08/19/20 (!) 153/98   Wt Readings from Last 3 Encounters:  01/13/21 204 lb (92.5 kg)  11/04/20 200 lb (90.7 kg)  08/19/20 196 lb 6.4 oz (89.1 kg)      Physical Exam Vitals reviewed.  Constitutional:      General: She is not in acute distress.    Appearance: Normal appearance. She is  well-developed. She is not ill-appearing.  HENT:     Head: Normocephalic and atraumatic.  Pulmonary:     Effort: Pulmonary effort is normal. No respiratory distress.  Musculoskeletal:     Cervical back: Normal range of motion and neck supple.  Neurological:     Mental Status: She is alert.  Psychiatric:        Mood and Affect: Mood normal.        Behavior: Behavior normal.        Thought Content: Thought content normal.        Judgment: Judgment normal.       Assessment & Plan     1. COVID-19 - Continue OTC symptomatic management of choice - Will send OTC vitamins and supplement information through AVS - Molnupiravir prescribed for patient due to age - Patient enrolled in MyChart symptom monitoring - Push fluids - Rest as needed - Discussed return precautions and when to seek in-person evaluation, sent via AVS as well - molnupiravir EUA 200 mg CAPS; Take 4 capsules (800 mg total) by mouth 2 (two) times daily for 5 days.  Dispense: 40 capsule; Refill: 0 - MyChart COVID-19 home monitoring program; Future - Temperature monitoring; Future   No follow-ups on file.     I discussed the assessment and treatment plan with the patient. The patient was provided an opportunity to ask questions and all were answered. The patient agreed with the plan and demonstrated an understanding of the instructions.   The patient was advised to call back or seek an in-person evaluation if the symptoms worsen or if the condition fails to improve as anticipated.  I provided 23 minutes of face-to-face time during this encounter via MyChart Video enabled encounter.   [place] [phone] (phone) [phone] (fax)  [place]

## 2021-01-25 NOTE — Telephone Encounter (Signed)
Spoke to pt who report she recently tested positive for COVID and wanted to seek guidance on what she should do. Pt state she currently just feels like she has a little cold and no energy.   Pt state she doesn't not have a pcp. Pt advised to go to an urgent and provided ED precautions. Pt verbalized understanding.

## 2021-01-25 NOTE — Progress Notes (Signed)
Spoke with patient, advised blood tests show mild increase in liver enzymes. This can be related to medication use but if so decreasing colchicine to every other day should help.Change is extremely small, we can recheck for this at future follow up.   *Patient has tested positive for COVID, she has contacted her cardiologist. Do you have any specific recommendations for the patient? *Patient has concerns with low energy levels, since she's been back to work for 6 months she has noticed no improvement with energy. Are there further labs to be drawn, once patient is out of quarantine, to check for low energy- vit d, iron, etc.? Please advise.

## 2021-01-25 NOTE — Telephone Encounter (Signed)
Patient is calling to report she tested + COVID. Patient has cough and low energy. Patient wants to know what her treatment options are- advised per COVID protocol: Patient notified of positive COVID-19 test results. Pt verbalized understanding. Pt reports symptoms of cough, fatigue  Criteria for self-isolation if you test positive for COVID-19, regardless of vaccination status:  -If you have mild symptoms that are resolving or have resolved, isolate at home for 5 days since symptoms started AND continue to wear a well-fitted mask when around others in the home and in public for 5 additional days after isolation is completed -If you have a fever and/or moderate to severe symptoms, isolate for at least 10 days since the symptoms started AND until you are fever free for at least 24 hours without the use of fever-reducing medications -If you tested positive and did not have symptoms, isolate for at least 5 days after your positive test  Use over-the-counter medications for symptoms.If you develop respiratory issues/distress, seek medical care in the Emergency Department.  If you must leave home or if you have to be around others please wear a mask. Please limit contact with immediate family members in the home, practice social distancing, frequent handwashing and clean hard surfaces touched frequently with household cleaning products. Members of your household will also need to quarantine and test.  Virtual visit scheduled- so patient can discuss treatment needed.

## 2021-01-25 NOTE — Progress Notes (Signed)
Sorry to hear she has COVID, hopefully with her vaccinations and her very good for age functional status she will get over this uneventfully.  Gout medicines are not problematic for COVID in general. If she needs to take treatment with oral COVID medicine such as paxlovid she should avoid colchicine while on this for drug interaction risks. We could take a look for any specific underlying causes of this fatigue such as iron studies, vitamin B12, vitmain D, and TSH.

## 2021-01-25 NOTE — Telephone Encounter (Signed)
Patient tested positive using a home test yesterday (01/24/21). The patient currently does not have a PCP and is not sure what she needs to do. Please advise

## 2021-01-29 ENCOUNTER — Encounter: Payer: Self-pay | Admitting: Physician Assistant

## 2021-01-29 ENCOUNTER — Telehealth: Payer: BC Managed Care – PPO | Admitting: Physician Assistant

## 2021-01-29 ENCOUNTER — Telehealth: Payer: Self-pay

## 2021-01-29 DIAGNOSIS — Z7189 Other specified counseling: Secondary | ICD-10-CM | POA: Diagnosis not present

## 2021-01-29 DIAGNOSIS — U071 COVID-19: Secondary | ICD-10-CM | POA: Diagnosis not present

## 2021-01-29 MED ORDER — BENZONATATE 100 MG PO CAPS: 100.0000 mg | ORAL_CAPSULE | Freq: Three times a day (TID) | ORAL | 0 refills | Status: DC | PRN

## 2021-01-29 MED ORDER — FLUTICASONE PROPIONATE 50 MCG/ACT NA SUSP: 2.0000 | Freq: Every day | NASAL | 0 refills | Status: DC

## 2021-01-29 NOTE — Progress Notes (Signed)
Virtual Visit Consent   Carmen Brown, you are scheduled for a virtual visit with a [facility] provider today.     Just as with appointments in the office, your consent must be obtained to participate.  Your consent will be active for this visit and any virtual visit you may have with one of our providers in the next 365 days.     If you have a MyChart account, a copy of this consent can be sent to you electronically.  All virtual visits are billed to your insurance company just like a traditional visit in the office.    As this is a virtual visit, video technology does not allow for your provider to perform a traditional examination.  This may limit your provider's ability to fully assess your condition.  If your provider identifies any concerns that need to be evaluated in person or the need to arrange testing (such as labs, EKG, etc.), we will make arrangements to do so.     Although advances in technology are sophisticated, we cannot ensure that it will always work on either your end or our end.  If the connection with a video visit is poor, the visit may have to be switched to a telephone visit.  With either a video or telephone visit, we are not always able to ensure that we have a secure connection.     I need to obtain your verbal consent now.   Are you willing to proceed with your visit today?    Carmen Brown has provided verbal consent on 01/29/2021 for a virtual visit (video or telephone).   Leeanne Rio, [place]   Date: 01/29/2021 12:19 PM   Virtual Visit via Video Note   I, Leeanne Rio, connected with Carmen Brown ([id number], [id number]) on 01/29/21 at 12:00 PM EDT by a video-enabled telemedicine application and verified that I am speaking with the correct person using two identifiers.  Location: Patient: Virtual Visit Location Patient: Home Provider: Virtual Visit Location Provider: Home Office   I discussed the limitations of evaluation and management  by telemedicine and the availability of in person appointments. The patient expressed understanding and agreed to proceed.    History of Present Illness: Carmen Brown is a 80 y.o. who identifies as a female who was assigned female at birth, and is being seen today for increase in cough over the past 24 hours. Patient was diagnosed with COVID on 01/25/2021. Had a video visit at that time with another provider who started her on supportive measures, OTC medications and began Rx for [facility] which patient endorses taking as directed. States she no longer has any fever, aches or chills. Denies sinus pain, ear pain or tooth pain. Some chest tightness with significant exertion but otherwise no breathing changes. Has noted since last night her cough has increased in severity. Is productive of a small amount of clear sputum. Has started Robitussin for cough but notes it is not helping much. Denies any other new or worsening symptoms. Has 2 doses left of her antiviral therapy. Mar[place] Kitchen  HPI: HPI  Problems:  Patient Active Problem List   Diagnosis Date Noted   Abnormal weight gain 01/22/2021   Colon cancer screening 01/22/2021   Pain of left hand 10/12/2020   Idiopathic gout of multiple sites 05/21/2020   Generalized osteoarthritis 05/21/2020   SOB (shortness of breath) 11/25/2019   Fatigue 11/25/2019   Educated about COVID-19 virus infection 08/28/2019   Dyslipidemia 08/20/2018  Medication management 11/20/2017   Hyperlipidemia 11/20/2017   CAD (coronary artery disease) 09/09/2017   Unstable angina ([place]) 09/07/2017   Osteoarthritis of left knee 06/08/2016   Osteoarthritis of right knee 06/08/2016   Bilateral carpal tunnel syndrome 06/08/2016   Degenerative disc disease, lumbar 04/06/2015   Chest pressure 11/05/2012   PAROXYSMAL VENTRICULAR TACHYCARDIA 08/21/2009   OBESITY, NOS 10/12/2006   HYPERTENSION, BENIGN SYSTEMIC 10/12/2006   CARDIOMEGALY 10/12/2006   RHINITIS, ALLERGIC 10/12/2006    MENOPAUSAL SYNDROME 10/12/2006   OSTEOARTHRITIS, LOWER LEG 10/12/2006   CERVICAL SPINE DISORDER, NOS 10/12/2006    Allergies:  Allergies  Allergen Reactions   Epinephrine Other (See Comments)    unknown   Latex     rash   Medications:  Current Outpatient Medications:    benzonatate (TESSALON) 100 MG capsule, Take 1 capsule (100 mg total) by mouth 3 (three) times daily as needed for cough., Disp: 30 capsule, Rfl: 0   fluticasone (FLONASE) 50 MCG/ACT nasal spray, Place 2 sprays into both nostrils daily., Disp: 16 g, Rfl: 0   allopurinol (ZYLOPRIM) 300 MG tablet, Take 1 tablet (300 mg total) by mouth daily., Disp: 90 tablet, Rfl: 1   amLODipine (NORVASC) 2.5 MG tablet, TAKE 1 TABLET BY MOUTH TWICE DAILY, Disp: 180 tablet, Rfl: 3   Ascorbic Acid (VITAMIN C) 1000 MG tablet, Take 1,000 mg by mouth 2 (two) times daily., Disp: , Rfl:    aspirin EC 81 MG tablet, Take 81 mg by mouth daily., Disp: , Rfl:    atorvastatin (LIPITOR) 40 MG tablet, TAKE 1 TABLET BY MOUTH EVERY DAY AT 6 PM, Disp: 90 tablet, Rfl: 3   B Complex Vitamins (B-COMPLEX/B-12 PO), Take by mouth., Disp: , Rfl:    Biotin 5000 MCG CAPS, Take 1 capsule by mouth daily. Takes daily, Disp: , Rfl:    colchicine 0.6 MG tablet, Take 1 tablet (0.6 mg total) by mouth every other day., Disp: 90 tablet, Rfl: 1   Glucosamine HCl (GLUCOSAMINE PO), Take by mouth., Disp: , Rfl:    lisinopril (ZESTRIL) 20 MG tablet, Take 1 tablet (20 mg total) by mouth 2 (two) times daily at 10 AM and 5 PM., Disp: 180 tablet, Rfl: 3   loratadine (CLARITIN) 10 MG tablet, Take 10 mg by mouth daily., Disp: , Rfl:    methocarbamol (ROBAXIN) 500 MG tablet, Take 1 tablet by mouth at bedtime as needed., Disp: , Rfl:    metoprolol succinate (TOPROL-XL) 25 MG 24 hr tablet, TAKE 1 TABLET(25 MG) BY MOUTH DAILY, Disp: 30 tablet, Rfl: 0   molnupiravir EUA 200 mg CAPS, Take 4 capsules (800 mg total) by mouth 2 (two) times daily for 5 days., Disp: 40 capsule, Rfl: 0   Multiple  Vitamin (MULTIVITAMIN) tablet, Take 1 tablet by mouth daily., Disp: , Rfl:    nitroGLYCERIN (NITROSTAT) 0.4 MG SL tablet, Place 1 tablet (0.4 mg total) under the tongue every 5 (five) minutes x 3 doses as needed for chest pain., Disp: 25 tablet, Rfl: 2   Pyridoxine HCl (VITAMIN B-6 PO), Take by mouth. (Patient not taking: Reported on 01/13/2021), Disp: , Rfl:    Zoster Vaccine Adjuvanted (SHINGRIX) injection, Shingrix (PF) 50 mcg/0.5 mL intramuscular suspension, kit (Patient not taking: Reported on 01/13/2021), Disp: , Rfl:   Observations/Objective: Patient is n no acute distress.  No labored breathing.  Speech is clear and coherent with logical content.  Patient is alert and oriented at baseline.   Assessment and Plan: 1. Educated about COVID-19 virus  infection - benzonatate (TESSALON) 100 MG capsule; Take 1 capsule (100 mg total) by mouth 3 (three) times daily as needed for cough.  Dispense: 30 capsule; Refill: 0 - fluticasone (FLONASE) 50 MCG/ACT nasal spray; Place 2 sprays into both nostrils daily.  Dispense: 16 g; Refill: 0  2. COVID-19 - benzonatate (TESSALON) 100 MG capsule; Take 1 capsule (100 mg total) by mouth 3 (three) times daily as needed for cough.  Dispense: 30 capsule; Refill: 0 - fluticasone (FLONASE) 50 MCG/ACT nasal spray; Place 2 sprays into both nostrils daily.  Dispense: 16 g; Refill: 0  Patient diagnosed with COVID 19 on 01/25/221. Is completing oral antiviral therapy with molnupiravir. Most symptoms have improved and resolved. Thankfully no alarm signs or symptoms present. Mild increase in cough with clear phlegm. Likely secondary viral bronchitis from COVID. Reviewed supportive measures and OTC medications. Rx tessalon and Flonase to use as directed. Continue care discussed at previous visit. Continue monitoring questionnaires. Strict ER precautions reviewed with patient who voiced understanding. She has been encouraged to get a pulse ox to use at home -- if O2 < 90% -- UC/ER  evaluation ASAP.   Follow Up Instructions: I discussed the assessment and treatment plan with the patient. The patient was provided an opportunity to ask questions and all were answered. The patient agreed with the plan and demonstrated an understanding of the instructions.  A copy of instructions were sent to the patient via MyChart.  The patient was advised to call back or seek an in-person evaluation if the symptoms worsen or if the condition fails to improve as anticipated.  Time:  I spent 10 minutes with the patient via telehealth technology discussing the above problems/concerns.    Leeanne Rio, PA-C

## 2021-01-29 NOTE — Telephone Encounter (Signed)
Patient called and advised the call is to discuss her worsening symptom of cough noted on the COVID questionnaire. She says she sent a message to Joycelyn Man, PA-C who she spoke to when she has the video visit on 01/25/21 asking for a prescription for cough medicine. She says the cough is getting better, but she can feel it in her chest and she's concerned about that. She says she's not SOB, but gets tired when she coughs. She says she stared using Robitussin this morning. I advised since no PCP to do another e-visit to speak to a provider about her worsening symptoms. Advised to seek out a new PCP on the website to manage her chronic conditions. Advised UC as another option instead of e-visit. Patient verbalized understanding.

## 2021-01-29 NOTE — Patient Instructions (Addendum)
Carmen Brown, thank you for joining Carmen Brown, Carmen Brown for today's virtual visit.  While this provider is not your primary care provider (PCP), if your PCP is located in our provider database this encounter information will be shared with them immediately following your visit.  Consent: (Patient) Carmen Brown provided verbal consent for this virtual visit at the beginning of the encounter.  Current Medications:  Current Outpatient Medications:    benzonatate (TESSALON) 100 MG capsule, Take 1 capsule (100 mg total) by mouth 3 (three) times daily as needed for cough., Disp: 30 capsule, Rfl: 0   fluticasone (FLONASE) 50 MCG/ACT nasal spray, Place 2 sprays into both nostrils daily., Disp: 16 g, Rfl: 0   allopurinol (ZYLOPRIM) 300 MG tablet, Take 1 tablet (300 mg total) by mouth daily., Disp: 90 tablet, Rfl: 1   amLODipine (NORVASC) 2.5 MG tablet, TAKE 1 TABLET BY MOUTH TWICE DAILY, Disp: 180 tablet, Rfl: 3   Ascorbic Acid (VITAMIN C) 1000 MG tablet, Take 1,000 mg by mouth 2 (two) times daily., Disp: , Rfl:    aspirin EC 81 MG tablet, Take 81 mg by mouth daily., Disp: , Rfl:    atorvastatin (LIPITOR) 40 MG tablet, TAKE 1 TABLET BY MOUTH EVERY DAY AT 6 PM, Disp: 90 tablet, Rfl: 3   B Complex Vitamins (B-COMPLEX/B-12 PO), Take by mouth., Disp: , Rfl:    Biotin 5000 MCG CAPS, Take 1 capsule by mouth daily. Takes daily, Disp: , Rfl:    colchicine 0.6 MG tablet, Take 1 tablet (0.6 mg total) by mouth every other day., Disp: 90 tablet, Rfl: 1   Glucosamine HCl (GLUCOSAMINE PO), Take by mouth., Disp: , Rfl:    lisinopril (ZESTRIL) 20 MG tablet, Take 1 tablet (20 mg total) by mouth 2 (two) times daily at 10 AM and 5 PM., Disp: 180 tablet, Rfl: 3   loratadine (CLARITIN) 10 MG tablet, Take 10 mg by mouth daily., Disp: , Rfl:    methocarbamol (ROBAXIN) 500 MG tablet, Take 1 tablet by mouth at bedtime as needed., Disp: , Rfl:    metoprolol succinate (TOPROL-XL) 25 MG 24 hr tablet, TAKE 1 TABLET(25  MG) BY MOUTH DAILY, Disp: 30 tablet, Rfl: 0   molnupiravir EUA 200 mg CAPS, Take 4 capsules (800 mg total) by mouth 2 (two) times daily for 5 days., Disp: 40 capsule, Rfl: 0   Multiple Vitamin (MULTIVITAMIN) tablet, Take 1 tablet by mouth daily., Disp: , Rfl:    nitroGLYCERIN (NITROSTAT) 0.4 MG SL tablet, Place 1 tablet (0.4 mg total) under the tongue every 5 (five) minutes x 3 doses as needed for chest pain., Disp: 25 tablet, Rfl: 2   Pyridoxine HCl (VITAMIN B-6 PO), Take by mouth. (Patient not taking: Reported on 01/13/2021), Disp: , Rfl:    Zoster Vaccine Adjuvanted (SHINGRIX) injection, Shingrix (PF) 50 mcg/0.5 mL intramuscular suspension, kit (Patient not taking: Reported on 01/13/2021), Disp: , Rfl:    Medications ordered in this encounter:  Meds ordered this encounter  Medications   benzonatate (TESSALON) 100 MG capsule    Sig: Take 1 capsule (100 mg total) by mouth 3 (three) times daily as needed for cough.    Dispense:  30 capsule    Refill:  0    Order Specific Question:   Supervising Provider    Answer:   MILLER, BRIAN [[id number]]   fluticasone (FLONASE) 50 MCG/ACT nasal spray    Sig: Place 2 sprays into both nostrils daily.    Dispense:  16  g    Refill:  0    Order Specific Question:   Supervising Provider    Answer:   Sabra Heck, BRIAN [[id number]]     *If you need refills on other medications prior to your next appointment, please contact your pharmacy*  Follow-Up: Call back or seek an in-person evaluation if the symptoms worsen or if the condition fails to improve as anticipated.  Other Instructions Please continue care as previously directed. You can add-on plain Mucinex to thin congestion. Finish your molnupiravir (antiviral medication). I have refilled your Flonase and sent in a cough medication (Tessalon). Please get a new pulse oximeter to use at home.  If O2 less than 90% you need to be evaluated in person at nearest ER. Continue filling out your daily questionnaires so we can  keep a track of any non improving/resolving or worsening symptoms.  If you have been instructed to have an in-person evaluation today at a local Urgent Care facility, please use the link below. It will take you to a list of all of our available [facility], including address, phone number and hours of operation. Please do not delay care  [facility]  If you or a family member do not have a primary care provider, use the link below to schedule a visit and establish care. When you choose a [facility] primary care physician or advanced practice provider, you gain a long-term partner in health. Find a Primary Care Provider  Learn more about [facility]'s in-office and virtual care options: [facility]

## 2021-02-05 ENCOUNTER — Ambulatory Visit: Payer: BC Managed Care – PPO | Admitting: Internal Medicine

## 2021-03-16 ENCOUNTER — Telehealth: Payer: Self-pay | Admitting: Radiology

## 2021-03-16 NOTE — Telephone Encounter (Signed)
Attempted to contact patient, Carmen Brown advising patient Dr. Dimple Casey has filled out the paper and it is available for her to pick up from the front desk.

## 2021-04-07 ENCOUNTER — Telehealth: Payer: Self-pay | Admitting: Radiology

## 2021-04-07 NOTE — Telephone Encounter (Signed)
Spoke with patient, she clarified what specifically needed to be updated:  Question revision #15- medical facts (i.e. symptoms patient experiences and/or diagnosis)  #18- up to 2 x per month at up to 7 days per episode   Initial and date the changes.   Changes have been reflected, paperwork will be given to Dr. Dimple Casey for updates and approval.

## 2021-04-07 NOTE — Telephone Encounter (Signed)
Spoke with patient, Dr. Dimple Casey filled out FMLA paperwork and patient is in need of some additional paperwork/documentation. Patient plans to drop the papers off today and hopes to have them filled out by EOD on Friday.

## 2021-04-07 NOTE — Telephone Encounter (Signed)
Medical Facts: The treating healthcare provider must provide the medical facts of the condition which may include information of the patient's health condition and regime of treatment and to describe IN DETAIL how the patient's condition is causing incapacity to require the team member time away from work.   Have healthcare provider update the certification form, initial and date any changes.   Phone: [phone] Fax: [phone]  Case #: [id number] AAID #: [id number]

## 2021-04-08 NOTE — Telephone Encounter (Signed)
Spoke with patient, advised forms have been updated and are ready for pick up. Patient plans to come by today to pick up forms.

## 2021-04-08 NOTE — Telephone Encounter (Signed)
Hopefully the updated document is acceptable.

## 2021-05-05 ENCOUNTER — Telehealth: Payer: Self-pay

## 2021-05-05 DIAGNOSIS — M1A09X0 Idiopathic chronic gout, multiple sites, without tophus (tophi): Secondary | ICD-10-CM

## 2021-05-05 MED ORDER — PREDNISONE 20 MG PO TABS: ORAL_TABLET | ORAL | 1 refills | Status: AC

## 2021-05-05 NOTE — Telephone Encounter (Signed)
I sent new Rx for prednisone 20 mg - 3 tabs/2 tabs /1 tab daily for 2 days each, taper to be taken as needed for gout flare

## 2021-05-05 NOTE — Telephone Encounter (Signed)
Unsure of specific dosage/instructions, no mention specifically in 01/13/2021 office note and not on patient's med list.   Next Visit: 07/14/2021 Last Visit: 01/13/2021  Last Fill: ???  DX: Idiopathic chronic gout of multiple sites without tophus  Current Dose per office note 01/13/2021: predniSONE (DELTASONE) 20 MG tablet  Okay to refill Prednisone?

## 2021-05-05 NOTE — Telephone Encounter (Signed)
Patient called requesting prescription refill of Prednisone to be sent to [facility] at [facility] in [place].  Patient states she is having gout pain.

## 2021-05-21 ENCOUNTER — Other Ambulatory Visit: Payer: Self-pay | Admitting: Cardiology

## 2021-06-07 ENCOUNTER — Other Ambulatory Visit: Payer: Self-pay | Admitting: Cardiology

## 2021-06-07 DIAGNOSIS — I10 Essential (primary) hypertension: Secondary | ICD-10-CM

## 2021-06-08 ENCOUNTER — Other Ambulatory Visit: Payer: Self-pay | Admitting: Cardiology

## 2021-06-21 ENCOUNTER — Other Ambulatory Visit: Payer: Self-pay | Admitting: Cardiology

## 2021-07-05 ENCOUNTER — Other Ambulatory Visit: Payer: Self-pay | Admitting: Cardiology

## 2021-07-05 DIAGNOSIS — I10 Essential (primary) hypertension: Secondary | ICD-10-CM

## 2021-07-14 ENCOUNTER — Ambulatory Visit: Payer: BC Managed Care – PPO | Admitting: Internal Medicine

## 2021-07-20 ENCOUNTER — Other Ambulatory Visit: Payer: Self-pay | Admitting: Internal Medicine

## 2021-07-20 DIAGNOSIS — M1A09X0 Idiopathic chronic gout, multiple sites, without tophus (tophi): Secondary | ICD-10-CM

## 2021-07-20 NOTE — Telephone Encounter (Signed)
Next Visit: 07/23/2021  Last Visit: 01/13/2021  Last Fill: 01/13/2021  DX: Idiopathic chronic gout of multiple sites without tophus   Current Dose per office note 01/13/2021: allopurinol (ZYLOPRIM) 300 MG tablet  Labs: 01/13/2021 Blood tests show mild increase in liver enzymes  Okay to refill Allopurinol?

## 2021-07-22 NOTE — Progress Notes (Signed)
Office Visit Note  Patient: Carmen Brown             Date of Birth: 1940/10/22           MRN: [id number]             PCP: Lezlie Lye, Meda Coffee, MD Referring: Lezlie Lye, Meda Coffee, * Visit Date: 07/23/2021   Subjective:  Gout (Some flares)   History of Present Illness: Carmen Brown is a 80 y.o. female here for follow up for gout on allopurinol 300 mg daily. Overall doing well she had COVID this summer and stopped taking colchicine with concern about medications and side effects at that time. She started taking tart cherry supplement regularly. She has only had one flare in the right foot for which she took steroids which resolved.  Previous HPI 01/13/21 Carmen Brown is a 80 y.o. female here for follow up for gout currently on allopurinol 300 mg p.o. daily and colchicine 0.6 mg daily.  Symptoms are doing pretty well she has not experienced any significant flare or attacks since the last visit.  She has noticed some increased weight gain attributes this to a lot of difficulty maintaining a good or balanced diet while constantly traveling.  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   Review of Systems  Constitutional:  Positive for fatigue.  HENT:  Positive for mouth dryness.   Eyes:  Negative for dryness.  Respiratory:  Positive for shortness of breath.   Cardiovascular:  Positive for swelling in legs/feet.  Gastrointestinal:  Negative for constipation.  Endocrine: Negative for excessive thirst.  Genitourinary:  Negative for difficulty urinating.  Musculoskeletal:  Positive for joint pain, joint pain, joint swelling and morning stiffness.  Skin:  Negative for rash.  Allergic/Immunologic: Negative for susceptible to infections.  Neurological:  Negative for weakness.  Hematological:  Negative for bruising/bleeding tendency.   Psychiatric/Behavioral:  Positive for sleep disturbance.    PMFS History:  Patient Active Problem List   Diagnosis Date Noted   Abnormal weight gain 01/22/2021   Colon cancer screening 01/22/2021   Pain of left hand 10/12/2020   Idiopathic gout of multiple sites 05/21/2020   Generalized osteoarthritis 05/21/2020   SOB (shortness of breath) 11/25/2019   Fatigue 11/25/2019   Educated about COVID-19 virus infection 08/28/2019   Dyslipidemia 08/20/2018   Medication management 11/20/2017   Hyperlipidemia 11/20/2017   CAD (coronary artery disease) 09/09/2017   Unstable angina (HCC) 09/07/2017   Osteoarthritis of left knee 06/08/2016   Osteoarthritis of right knee 06/08/2016   Bilateral carpal tunnel syndrome 06/08/2016   Degenerative disc disease, lumbar 04/06/2015   Chest pressure 11/05/2012   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  Jan 2019   Cardiomegaly    Cardiomyopathy [facility])    EF 35% in the distant past but NL EF 2009   Hypertension, benign    systemic   Menopausal syndrome    Obesity  Osteoarthritis of lower leg, localized    Paroxysmal VT    Thyroid disease     Family History  Problem Relation Age of Onset   Heart disease Mother 57       MI   Heart disease Father 3       CABG   CAD Sister 54       Stent   Lymphoma Sister    Stroke Maternal Grandmother    Diabetes Maternal Grandfather    Diabetes Maternal Uncle    Past Surgical History:  Procedure Laterality Date   CARPAL TUNNEL RELEASE Right 11/2016   CATARACT EXTRACTION  2020   Per patient   LEFT HEART CATH AND  CORONARY ANGIOGRAPHY N/A 09/08/2017   Procedure: LEFT HEART CATH AND CORONARY ANGIOGRAPHY;  Surgeon: Yvonne Kendall, MD;  [facility];  Service: Cardiovascular;  Laterality: N/A;   MASS EXCISION  03/15/2012   Procedure: MINOR EXCISION OF MASS;  Surgeon: Wyn Forster., MD;  Location: [facility];  Service: Orthopedics;  Laterality: Right;  debride IP right long finger, excision mucoid cyst   ventricular tacticartia     Social History   Social History Narrative   Financial controller for [employer].  Education: 79yrs.  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 Caffeine 2 cups decaf. (not every day).   Immunization History  Administered Date(s) Administered   Fluad Quad(high Dose 65+) 07/16/2020   Influenza Split 08/28/2011   Influenza, High Dose Seasonal PF 07/19/2018   Influenza,inj,Quad PF,6+ Mos 07/22/2014   Influenza-Unspecified 05/29/2016, 05/15/2017   Moderna Sars-Covid-2 Vaccination 09/13/2019, 10/09/2019, 05/15/2020   Pneumococcal Polysaccharide-23 07/28/2009   Td 07/15/2000, 02/21/2012   Tdap 02/21/2012   Zoster Recombinat (Shingrix) 08/20/2018, 01/31/2019   Zoster, Live 05/16/2011     Objective: Vital Signs: BP 126/65 (BP Location: Left Arm, Patient Position: Sitting, Cuff Size: Normal)   Pulse 79   Resp 16   Ht 5' 4.5" (1.638 m)   Wt 204 lb (92.5 kg)   BMI 34.48 kg/m    Physical Exam Constitutional:      Appearance: She is obese.  Cardiovascular:     Rate and Rhythm: Normal rate and regular rhythm.  Pulmonary:     Effort: Pulmonary effort is normal.     Breath sounds: Normal breath sounds.  Musculoskeletal:     Right lower leg: No edema.     Left lower leg: No edema.  Skin:    General: Skin is warm and dry.     Findings: No rash.  Neurological:     Mental Status: She is alert.  Psychiatric:        Mood and Affect: Mood normal.     Musculoskeletal Exam:  Elbows full ROM no tenderness or swelling Wrists full ROM no tenderness or swelling Fingers full  ROM no tenderness or swelling Knees full ROM no tenderness or swelling Ankles full ROM no tenderness or swelling MTPs full ROM no tenderness or swelling, large left 1st MTP bunion with lateral deviation    Investigation: No additional findings.  Imaging: No results found.  Recent Labs: Lab Results  Component Value Date   WBC 4.9 01/13/2021   HGB 12.9 01/13/2021   PLT 209 01/13/2021   NA 140 01/13/2021   K 4.2 01/13/2021   CL 107 01/13/2021   CO2 25 01/13/2021   GLUCOSE 112 (H) 01/13/2021   BUN 16 01/13/2021   CREATININE 0.87 01/13/2021   BILITOT 0.5 01/13/2021   ALKPHOS 94 02/28/2020  AST 41 (H) 01/13/2021   ALT 44 (H) 01/13/2021   PROT 6.2 01/13/2021   ALBUMIN 4.4 02/28/2020   CALCIUM 8.9 01/13/2021   GFRAA 74 01/13/2021    Speciality Comments: No specialty comments available.  Procedures:  No procedures performed Allergies: Epinephrine and Latex   Assessment / Plan:     Visit Diagnoses: Idiopathic chronic gout of multiple sites without tophus - Plan: Uric acid, CBC with Differential/Platelet, COMPLETE METABOLIC PANEL WITH GFR, methylPREDNISolone (MEDROL DOSEPAK) 4 MG TBPK tablet  Low frequency of flares uric acid at goal last visit repeat uric acid today. Checking CBC and CMP again for monitoring can decrease frequency to annual unless new medication change needed. Plan to continue allopurinol 300 mg daily and will resend steroid pack for PRN if repeat flares and frequent travelling for work.  Orders: Orders Placed This Encounter  Procedures   Uric acid   CBC with Differential/Platelet   COMPLETE METABOLIC PANEL WITH GFR    Meds ordered this encounter  Medications   methylPREDNISolone (MEDROL DOSEPAK) 4 MG TBPK tablet    Sig: Take for 6 days as needed for gout flare: 6, 5, 4, 3, 2, then 1 tablet daily    Dispense:  21 tablet    Refill:  1      Follow-Up Instructions: Return in about 1 year (around 07/23/2022) for Gout allopurinol f/u  22yr.   Fuller Plan, MD  Note - This record has been created using [employer].  Chart creation errors have been sought, but may not always  have been located. Such creation errors do not reflect on  the standard of medical care.

## 2021-07-22 NOTE — Progress Notes (Signed)
Cardiology Office Note   Date:  07/23/2021   ID:  Carmen, Brown 08-17-1940, MRN [id number]  PCP:  Lezlie Lye, Meda Coffee, MD  Cardiologist:   Rollene Rotunda, MD   Chief Complaint  Patient presents with   Shortness of Breath       History of Present Illness: Carmen Brown is a 80 y.o. female who presents for follow up of CAD.  She was admitted with [place] in Jan 2019.  She was found to have 95% LAD stenosis treated stent.  EF was normal.    When I last saw her she had SOB and I ordered a POET (Plain Old Exercise Treadmill) which was negative for ischemia in August.   Since I last saw her  POET (Plain Old Exercise Treadmill).  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  She thinks the breathing has gotten a little bit better.  She is not having any new chest pressure.  She had jaw discomfort but she actually took some prednisone.  This was about a week or so ago.  It got better.  It went to the other side and she thought she had some gland swelling and she again said it went away.  She is on urgent care.  It was not like previous angina which was a substernal chest discomfort.  She has otherwise not had this for a week.  Again she is able to be active.  She denies any PND or orthopnea.  She had no new palpitations, presyncope or syncope   Past Medical History:  Diagnosis Date   Allergy    Arthritis    CAD (coronary artery disease)    95% proximal LAD stenosis with DES.  Jan 2019   Cardiomegaly    Cardiomyopathy (HCC)    EF 35% in the distant past but NL EF 2009   Hypertension, benign    systemic   Menopausal syndrome    Obesity    Osteoarthritis of lower leg, localized    Paroxysmal VT    Thyroid disease     Past Surgical History:  Procedure Laterality Date   CARPAL TUNNEL RELEASE Right 11/2016   CATARACT EXTRACTION  2020   Per patient   LEFT HEART CATH AND CORONARY ANGIOGRAPHY N/A 09/08/2017   Procedure: LEFT HEART CATH AND  CORONARY ANGIOGRAPHY;  Surgeon: Yvonne Kendall, MD;  [facility];  Service: Cardiovascular;  Laterality: N/A;   MASS EXCISION  03/15/2012   Procedure: MINOR EXCISION OF MASS;  Surgeon: Wyn Forster., MD;  Location: [facility];  Service: Orthopedics;  Laterality: Right;  debride IP right long finger, excision mucoid cyst   ventricular tacticartia       Current Outpatient Medications  Medication Sig Dispense Refill   allopurinol (ZYLOPRIM) 300 MG tablet TAKE 1 TABLET(300 MG) BY MOUTH DAILY 90 tablet 1   amoxicillin (AMOXIL) 500 MG capsule Take 500 mg by mouth 3 (three) times daily.     Ascorbic Acid (VITAMIN C) 1000 MG tablet Take 1,000 mg by mouth 2 (two) times daily.     B Complex Vitamins (B-COMPLEX/B-12 PO) Take by mouth.     Biotin 5000 MCG CAPS Take 1 capsule by mouth daily. Takes daily     fluticasone (FLONASE) 50 MCG/ACT nasal spray Place 2 sprays into both nostrils daily. 16 g 0   Glucosamine HCl (GLUCOSAMINE PO) Take by mouth.  lisinopril (ZESTRIL) 20 MG tablet TAKE 1 TABLET(20 MG) BY MOUTH TWICE DAILY 180 tablet 0   loratadine (CLARITIN) 10 MG tablet Take 10 mg by mouth daily.     methylPREDNISolone (MEDROL DOSEPAK) 4 MG TBPK tablet Take for 6 days as needed for gout flare: 6, 5, 4, 3, 2, then 1 tablet daily 21 tablet 1   metoprolol succinate (TOPROL-XL) 25 MG 24 hr tablet TAKE 1 TABLET(25 MG) BY MOUTH DAILY 30 tablet 0   Multiple Vitamin (MULTIVITAMIN) tablet Take 1 tablet by mouth daily.     Multiple Vitamins-Minerals (PRESERVISION AREDS 2 PO) Take by mouth.     nitroGLYCERIN (NITROSTAT) 0.4 MG SL tablet Place 1 tablet (0.4 mg total) under the tongue every 5 (five) minutes x 3 doses as needed for chest pain. 25 tablet 2   Pyridoxine HCl (VITAMIN B-6 PO) Take by mouth.     Zoster Vaccine Adjuvanted [facility]) injection      amLODipine (NORVASC) 2.5 MG tablet Take 1 tablet (2.5 mg total) by mouth 2 (two) times daily. 180 tablet 3   aspirin  EC 81 MG tablet Take 1 tablet (81 mg total) by mouth daily. 90 tablet 3   atorvastatin (LIPITOR) 40 MG tablet Take 1 tablet (40 mg total) by mouth daily. 90 tablet 3   benzonatate (TESSALON) 100 MG capsule Take 1 capsule (100 mg total) by mouth 3 (three) times daily as needed for cough. (Patient not taking: Reported on 07/23/2021) 30 capsule 0   methocarbamol (ROBAXIN) 500 MG tablet Take 1 tablet by mouth at bedtime as needed. (Patient not taking: Reported on 07/23/2021)     No current facility-administered medications for this visit.    Allergies:   Epinephrine and Latex    ROS:  Please see the history of present illness.   Otherwise, review of systems are positive for none.   All other systems are reviewed and negative.    PHYSICAL EXAM: VS:  BP 124/82   Pulse 83   Ht 5\' 4"  (1.626 m)   Wt 202 lb 9.6 oz (91.9 kg)   SpO2 96%   BMI 34.78 kg/m  , BMI Body mass index is 34.78 kg/m.  GENERAL:  Well appearing NECK:  No jugular venous distention, waveform within normal limits, carotid upstroke brisk and symmetric, no bruits, no thyromegaly LUNGS:  Clear to auscultation bilaterally CHEST:  Unremarkable HEART:  PMI not displaced or sustained,S1 and S2 within normal limits, no S3, no S4, no clicks, no rubs, no murmurs ABD:  Flat, positive bowel sounds normal in frequency in pitch, no bruits, no rebound, no guarding, no midline pulsatile mass, no hepatomegaly, no splenomegaly EXT:  2 plus pulses throughout, no edema, no cyanosis no clubbing  EKG:  EKG is  ordered today. Sinus rhythm, rate 77, axis within normal limits, intervals within normal limits, no acute ST-T wave changes.  Recent Labs: 01/13/2021: ALT 44; BUN 16; Creat 0.87; Hemoglobin 12.9; Platelets 209; Potassium 4.2; Sodium 140    Lipid Panel    Component Value Date/Time   CHOL 114 08/29/2019 1023   TRIG 127 08/29/2019 1023   HDL 46 08/29/2019 1023   CHOLHDL 2.5 08/29/2019 1023   CHOLHDL 3.9 09/08/2017 0259   VLDL 20  09/08/2017 0259   LDLCALC 46 08/29/2019 1023      Wt Readings from Last 3 Encounters:  07/23/21 202 lb 9.6 oz (91.9 kg)  07/23/21 204 lb (92.5 kg)  01/13/21 204 lb (92.5 kg)  Other studies Reviewed: Additional studies/ records that were reviewed today include:   None Review of the above records demonstrates:  NA   ASSESSMENT AND PLAN:   CAD:   She had a negative stress test last year.  Her breathing is better than last year.  She had a nonanginal jaw discomfort.  I do not think further cardiovascular testing is suggested and she will continue with risk reduction.   HTN:  The blood pressure is controlled.  No change in therapy.    DYSLIPIDEMIA:   She is overdue for lipid profile and I will draw this.  I will also check an A1c  DYSPNEA: This is improved.  No change in therapy.     Current medicines are reviewed at length with the patient today.  The patient does not have concerns regarding medicines.  The following changes have been made:  None  Labs/ tests ordered today include:   Orders Placed This Encounter  Procedures   Flu Vaccine QUAD High Dose(Fluad)   Lipid panel   Hemoglobin A1c   EKG 12-Lead      Disposition:   FU with me in 12  months.   Signed, Rollene Rotunda, MD  07/23/2021 12:26 PM    [facility]

## 2021-07-23 ENCOUNTER — Ambulatory Visit: Payer: BC Managed Care – PPO | Admitting: Cardiology

## 2021-07-23 ENCOUNTER — Other Ambulatory Visit: Payer: Self-pay

## 2021-07-23 ENCOUNTER — Encounter: Payer: Self-pay | Admitting: Internal Medicine

## 2021-07-23 ENCOUNTER — Encounter: Payer: Self-pay | Admitting: Cardiology

## 2021-07-23 ENCOUNTER — Ambulatory Visit: Payer: BC Managed Care – PPO | Admitting: Internal Medicine

## 2021-07-23 VITALS — BP 124/82 | HR 83 | Ht 64.0 in | Wt 202.6 lb

## 2021-07-23 VITALS — BP 126/65 | HR 79 | Resp 16 | Ht 64.5 in | Wt 204.0 lb

## 2021-07-23 DIAGNOSIS — E785 Hyperlipidemia, unspecified: Secondary | ICD-10-CM | POA: Diagnosis not present

## 2021-07-23 DIAGNOSIS — M1A09X0 Idiopathic chronic gout, multiple sites, without tophus (tophi): Secondary | ICD-10-CM

## 2021-07-23 DIAGNOSIS — Z23 Encounter for immunization: Secondary | ICD-10-CM | POA: Diagnosis not present

## 2021-07-23 DIAGNOSIS — R0602 Shortness of breath: Secondary | ICD-10-CM | POA: Diagnosis not present

## 2021-07-23 DIAGNOSIS — I10 Essential (primary) hypertension: Secondary | ICD-10-CM

## 2021-07-23 DIAGNOSIS — I2510 Atherosclerotic heart disease of native coronary artery without angina pectoris: Secondary | ICD-10-CM

## 2021-07-23 MED ORDER — AMLODIPINE BESYLATE 2.5 MG PO TABS: 2.5000 mg | ORAL_TABLET | Freq: Two times a day (BID) | ORAL | 3 refills | Status: DC

## 2021-07-23 MED ORDER — METHYLPREDNISOLONE 4 MG PO TBPK: ORAL_TABLET | ORAL | 1 refills | Status: DC

## 2021-07-23 MED ORDER — ATORVASTATIN CALCIUM 40 MG PO TABS: 40.0000 mg | ORAL_TABLET | Freq: Every day | ORAL | 3 refills | Status: DC

## 2021-07-23 MED ORDER — ASPIRIN EC 81 MG PO TBEC: 81.0000 mg | DELAYED_RELEASE_TABLET | Freq: Every day | ORAL | 3 refills | Status: AC

## 2021-07-23 NOTE — Patient Instructions (Signed)
Medication Instructions:  Your Physician recommend you continue on your current medication as directed.    *If you need a refill on your cardiac medications before your next appointment, please call your pharmacy*   Lab Work: Return for fasting blood work (A1C, lipids) If you have labs (blood work) drawn today and your tests are completely normal, you will receive your results only by: MyChart Message (if you have MyChart) OR A paper copy in the mail If you have any lab test that is abnormal or we need to change your treatment, we will call you to review the results.   Follow-Up: At [facility], you and your health needs are our priority.  As part of our continuing mission to provide you with exceptional heart care, we have created designated Provider Care Teams.  These Care Teams include your primary Cardiologist (physician) and Advanced Practice Providers (APPs -  Physician Assistants and Nurse Practitioners) who all work together to provide you with the care you need, when you need it.  We recommend signing up for the patient portal called "MyChart".  Sign up information is provided on this After Visit Summary.  MyChart is used to connect with patients for Virtual Visits (Telemedicine).  Patients are able to view lab/test results, encounter notes, upcoming appointments, etc.  Non-urgent messages can be sent to your provider as well.   To learn more about what you can do with MyChart, go to [email or web address].    Your next appointment:   12 month(s)  The format for your next appointment:   In Person  Provider:   Rollene Rotunda, MD

## 2021-07-24 LAB — COMPLETE METABOLIC PANEL WITH GFR
AG Ratio: 1.7 (calc) (ref 1.0–2.5)
ALT: 34 U/L — ABNORMAL HIGH (ref 6–29)
AST: 29 U/L (ref 10–35)
Albumin: 4.3 g/dL (ref 3.6–5.1)
Alkaline phosphatase (APISO): 95 U/L (ref 37–153)
BUN: 15 mg/dL (ref 7–25)
CO2: 26 mmol/L (ref 20–32)
Calcium: 9.4 mg/dL (ref 8.6–10.4)
Chloride: 104 mmol/L (ref 98–110)
Creat: 0.91 mg/dL (ref 0.60–1.00)
Globulin: 2.6 g/dL (calc) (ref 1.9–3.7)
Glucose, Bld: 107 mg/dL — ABNORMAL HIGH (ref 65–99)
Potassium: 4.6 mmol/L (ref 3.5–5.3)
Sodium: 139 mmol/L (ref 135–146)
Total Bilirubin: 0.7 mg/dL (ref 0.2–1.2)
Total Protein: 6.9 g/dL (ref 6.1–8.1)
eGFR: 65 mL/min/{1.73_m2} (ref >=60)

## 2021-07-24 LAB — CBC WITH DIFFERENTIAL/PLATELET
Absolute Monocytes: 730 cells/uL (ref 200–950)
Basophils Absolute: 51 cells/uL (ref 0–200)
Basophils Relative: 0.8 %
Eosinophils Absolute: 301 cells/uL (ref 15–500)
Eosinophils Relative: 4.7 %
HCT: 42.3 % (ref 35.0–45.0)
Hemoglobin: 14.3 g/dL (ref 11.7–15.5)
Lymphs Abs: 1933 cells/uL (ref 850–3900)
MCH: 32.8 pg (ref 27.0–33.0)
MCHC: 33.8 g/dL (ref 32.0–36.0)
MCV: 97 fL (ref 80.0–100.0)
MPV: 12.1 fL (ref 7.5–12.5)
Monocytes Relative: 11.4 %
Neutro Abs: 3386 cells/uL (ref 1500–7800)
Neutrophils Relative %: 52.9 %
Platelets: 206 10*3/uL (ref 140–400)
RBC: 4.36 10*6/uL (ref 3.80–5.10)
RDW: 12.5 % (ref 11.0–15.0)
Total Lymphocyte: 30.2 %
WBC: 6.4 10*3/uL (ref 3.8–10.8)

## 2021-07-24 LAB — URIC ACID: Uric Acid, Serum: 4.4 mg/dL (ref 2.5–7.0)

## 2021-07-26 NOTE — Progress Notes (Signed)
Lab results look okay to continue current medications. Uric acid is 4.4 well under goal, normal blood count, liver and kidney function are fine.

## 2021-08-17 ENCOUNTER — Other Ambulatory Visit: Payer: Self-pay | Admitting: Cardiology

## 2021-08-17 ENCOUNTER — Other Ambulatory Visit: Payer: Self-pay | Admitting: Internal Medicine

## 2021-08-17 DIAGNOSIS — E785 Hyperlipidemia, unspecified: Secondary | ICD-10-CM

## 2021-08-17 DIAGNOSIS — R0602 Shortness of breath: Secondary | ICD-10-CM

## 2021-08-17 DIAGNOSIS — I2510 Atherosclerotic heart disease of native coronary artery without angina pectoris: Secondary | ICD-10-CM

## 2021-08-17 DIAGNOSIS — M1A09X0 Idiopathic chronic gout, multiple sites, without tophus (tophi): Secondary | ICD-10-CM

## 2021-08-17 DIAGNOSIS — I10 Essential (primary) hypertension: Secondary | ICD-10-CM

## 2021-08-20 ENCOUNTER — Telehealth: Payer: Self-pay | Admitting: *Deleted

## 2021-08-20 NOTE — Telephone Encounter (Addendum)
Patient contacted the office stating the pharmacy advised her we have not refilled her Allopurinol. Patient advised we sent the prescription on 07/20/2021 for a 90 day supply with 1 additional refill. Contacted the pharmacy and they advised me they have the prescription and they will get it ready for the patient. Advised patient.

## 2021-09-22 ENCOUNTER — Other Ambulatory Visit: Payer: Self-pay | Admitting: Cardiology

## 2021-09-29 ENCOUNTER — Other Ambulatory Visit: Payer: Self-pay | Admitting: Cardiology

## 2021-09-29 DIAGNOSIS — I10 Essential (primary) hypertension: Secondary | ICD-10-CM

## 2021-12-19 ENCOUNTER — Other Ambulatory Visit: Payer: Self-pay | Admitting: Cardiology

## 2021-12-31 ENCOUNTER — Ambulatory Visit (INDEPENDENT_AMBULATORY_CARE_PROVIDER_SITE_OTHER): Payer: BC Managed Care – PPO

## 2021-12-31 ENCOUNTER — Encounter: Payer: Self-pay | Admitting: Nurse Practitioner

## 2021-12-31 ENCOUNTER — Ambulatory Visit: Payer: BC Managed Care – PPO | Admitting: Nurse Practitioner

## 2021-12-31 VITALS — BP 130/84 | HR 84 | Temp 98.2°F | Ht 64.0 in | Wt 204.0 lb

## 2021-12-31 DIAGNOSIS — R300 Dysuria: Secondary | ICD-10-CM | POA: Diagnosis not present

## 2021-12-31 DIAGNOSIS — R053 Chronic cough: Secondary | ICD-10-CM

## 2021-12-31 DIAGNOSIS — Z6835 Body mass index (BMI) 35.0-35.9, adult: Secondary | ICD-10-CM

## 2021-12-31 DIAGNOSIS — E785 Hyperlipidemia, unspecified: Secondary | ICD-10-CM | POA: Diagnosis not present

## 2021-12-31 DIAGNOSIS — R739 Hyperglycemia, unspecified: Secondary | ICD-10-CM

## 2021-12-31 DIAGNOSIS — K649 Unspecified hemorrhoids: Secondary | ICD-10-CM

## 2021-12-31 LAB — LIPID PANEL
Cholesterol: 101 mg/dL (ref 0–200)
HDL: 35.2 mg/dL — ABNORMAL LOW (ref >39.00)
NonHDL: 66.01
Total CHOL/HDL Ratio: 3
Triglycerides: 240 mg/dL — ABNORMAL HIGH (ref 0.0–149.0)
VLDL: 48 mg/dL — ABNORMAL HIGH (ref 0.0–40.0)

## 2021-12-31 LAB — COMPREHENSIVE METABOLIC PANEL
ALT: 40 U/L — ABNORMAL HIGH (ref 0–35)
AST: 44 U/L — ABNORMAL HIGH (ref 0–37)
Albumin: 4.3 g/dL (ref 3.5–5.2)
Alkaline Phosphatase: 89 U/L (ref 39–117)
BUN: 17 mg/dL (ref 6–23)
CO2: 29 mEq/L (ref 19–32)
Calcium: 9.9 mg/dL (ref 8.4–10.5)
Chloride: 103 mEq/L (ref 96–112)
Creatinine, Ser: 1.19 mg/dL (ref 0.40–1.20)
GFR: 43.55 mL/min — ABNORMAL LOW (ref >60.00)
Glucose, Bld: 90 mg/dL (ref 70–99)
Potassium: 5.2 mEq/L — ABNORMAL HIGH (ref 3.5–5.1)
Sodium: 140 mEq/L (ref 135–145)
Total Bilirubin: 0.7 mg/dL (ref 0.2–1.2)
Total Protein: 7.5 g/dL (ref 6.0–8.3)

## 2021-12-31 LAB — CBC
HCT: 41.8 % (ref 36.0–46.0)
Hemoglobin: 14.4 g/dL (ref 12.0–15.0)
MCHC: 34.5 g/dL (ref 30.0–36.0)
MCV: 96.5 fl (ref 78.0–100.0)
Platelets: 227 10*3/uL (ref 150.0–400.0)
RBC: 4.33 Mil/uL (ref 3.87–5.11)
RDW: 13.7 % (ref 11.5–15.5)
WBC: 7.5 10*3/uL (ref 4.0–10.5)

## 2021-12-31 LAB — HEMOGLOBIN A1C: Hgb A1c MFr Bld: 5.7 % (ref 4.6–6.5)

## 2021-12-31 LAB — URINALYSIS WITH CULTURE, IF INDICATED
Bilirubin Urine: NEGATIVE
Hgb urine dipstick: NEGATIVE
Leukocytes,Ua: NEGATIVE
Nitrite: NEGATIVE
Specific Gravity, Urine: 1.025 (ref 1.000–1.030)
Total Protein, Urine: NEGATIVE
Urine Glucose: NEGATIVE
Urobilinogen, UA: 0.2 (ref 0.0–1.0)
pH: 6 (ref 5.0–8.0)

## 2021-12-31 LAB — LDL CHOLESTEROL, DIRECT: Direct LDL: 49 mg/dL

## 2021-12-31 LAB — TSH: TSH: 5.04 u[IU]/mL (ref 0.35–5.50)

## 2021-12-31 MED ORDER — OMEPRAZOLE 20 MG PO CPDR: 20.0000 mg | DELAYED_RELEASE_CAPSULE | Freq: Every day | ORAL | 0 refills | Status: DC

## 2021-12-31 MED ORDER — FLUTICASONE PROPIONATE 50 MCG/ACT NA SUSP: 2.0000 | Freq: Every day | NASAL | 0 refills | Status: DC

## 2021-12-31 NOTE — Patient Instructions (Signed)
Hemorrhoids: Take over the counter colace as needed to soften stools and prevent constipation. Increase fiber in your diet. Make sure to hydrate with water. Use your preparation H suppositories as needed. If hemorrhoids continue to bother you let me know and we will refer to GI for evaluation.   Dysuria: will start on antibiotics pending urine testing results  Cough: will notify you via mychart about your xray results. Will refer to pulmonology for further evaluation. Will try omeprazole 20mg  by mouth daily to see if this helps with cough. If no improvement in cough within 7-10 days of starting the omeprazole you can discontinue it.

## 2021-12-31 NOTE — Progress Notes (Signed)
Subjective:  Patient ID: Carmen Brown, female    DOB: May 16, 1941  Age: 81 y.o. MRN: [id number]  CC:  Chief Complaint  Patient presents with   Transitions Of Care   Cough    Since January it comes and goes even with 4 rounds of antibotics    Urinary Tract Infection    hemmer   Hemorrhoids      HPI  This patient arrives today for the above.  She is transferring to this office for her primary care needs.  Her main concerns today are as listed above.  Cough: She reports this initially started in late November 2022.  Since then she has been treated with prednisone and multiple rounds of antibiotics.  Cough remains persistent.  Severity seems to wax and wane.  Will intermittently be productive.  She denies history of smoking.  She is on lisinopril for hypertension, but reports being on this a while prior to the cough starting.  She does have history of allergic rhinitis.  She denies heartburn.  Dysuria: Symptoms started about 2 days ago.  She is experiencing a burning with peeing.  She denies visible blood in her urine, nausea, diarrhea.  Hemorrhoids: She reports she has hemorrhoids, however she has never been evaluated for these before.  She tells me she usually uses over-the-counter Preparation H suppositories as needed with improvement in her symptoms.  Symptoms she experiences when her hemorrhoids flareup include "feels like sitting on a rock" and some rectal bleeding.  She reports last colonoscopy was done in 2009, she was supposed to repeat it in 2019 but put off doing this due to the pandemic.  Past Medical History:  Diagnosis Date   Allergy    Arthritis    CAD (coronary artery disease)    95% proximal LAD stenosis with DES.  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  Education: 66yrs.  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 Caffeine 2 cups decaf. (not every day).   Social History   Tobacco Use   Smoking status: Never   Smokeless tobacco: Never  Substance Use Topics   Alcohol use: Yes    Comment: Occasionally     Current Meds  Medication Sig   omeprazole (PRILOSEC) 20 MG capsule Take 1 capsule (20 mg total) by mouth daily.    ROS:  Review of Systems  Constitutional:  Negative for chills and fever.  Gastrointestinal:  Positive for abdominal pain. Negative for nausea and vomiting.       (-) new bowel incontinence  Genitourinary:  Positive for dysuria. Negative for flank pain and hematuria.       (+) intermittent urinary incontinence with coughing  Neurological:  Negative for sensory change and weakness.    Objective:   Today's Vitals: BP 130/84 (BP Location: Left Arm, Patient Position: Sitting, Cuff Size: Normal)   Pulse 84   Temp 98.2 F (36.8 C) (Oral)   Ht 5\' 4"  (1.626 m)   Wt 204 lb (92.5 kg)   SpO2 96%   BMI 35.02 kg/m     12/31/2021    2:03 PM 07/23/2021   11:17 AM 07/23/2021    8:06 AM  Vitals with BMI  Height 5\' 4"  5\' 4"  5' 4.5"  Weight 204 lbs 202 lbs 10 oz 204 lbs  BMI 35 [id number]  Systolic [id number]  Diastolic 84 82 65  Pulse 84 83 79     Physical Exam Vitals reviewed.  Constitutional:      General: She is not in acute distress.    Appearance: Normal appearance.  HENT:     Head: Normocephalic and atraumatic.  Neck:     Vascular: No carotid bruit.  Cardiovascular:     Rate and Rhythm: Normal rate and regular rhythm.     Pulses: Normal pulses.     Heart sounds: Murmur  (patient reports this is chronic and not new) heard.  Pulmonary:     Effort: Pulmonary effort is normal.     Breath sounds: Normal breath sounds.  Skin:    General: Skin is warm and dry.  Neurological:     General: No focal deficit present.     Mental Status: She is alert and oriented to person, place, and time.  Psychiatric:        Mood and Affect: Mood normal.        Behavior: Behavior normal.        Judgment: Judgment normal.         Assessment and Plan   1. Chronic cough   2. Dysuria   3. Class 2 severe obesity with serious comorbidity and body mass index (BMI) of 35.0 to 35.9 in adult, unspecified obesity type ([place])   4. Hyperlipidemia, unspecified hyperlipidemia type   5. Hyperglycemia   6. Hemorrhoids, unspecified hemorrhoid type      Plan: 1.  Chronic, stable.  Oxygen saturations 96% on room air today.  Will obtain chest x-ray today and refer to pulmonology for assistance with evaluation.  We will trial course of omeprazole to see if she may have silent GERD that could be contributing to her cough.  Could consider switching from ACE inhibitor to ARB, however at this time I think likelihood of ACEI induced cough is low.  Thus, for now she will continue on her ACEI. 2.  Point-of-care urinalysis machine not working today.  Will order urinalysis via lab with reflex to culture.  Consider antibiotic prescription pending these results. 3.  Patient did report she is trying to lose weight.  We briefly discussed Wegovy, but will consider this at future appointment regarding assistance with weight loss. 4. , 5. We will check lipid panel today as well as blood work for further evaluation.  Further recommendations may be made based upon these results. 6.  Patient did not feel comfortable allowing me to perform physical rectal exam to evaluate possible hemorrhoids.  Thus exam not completed today.  I recommend she continue using her Preparation H as needed as well as to focus on  increasing fiber intake in her diet, maintaining proper hydration with water, and using Colace as a stool softener as needed to prevent constipation.  Would  recommend referral to gastroenterology if symptoms persist or worsen.   Tests ordered Orders Placed This Encounter  Procedures   DG Chest 2 View   TSH   Hemoglobin A1c   Lipid panel   Comprehensive metabolic panel   CBC   Urinalysis with Culture, if indicated   Ambulatory referral to Pulmonology      Meds ordered this encounter  Medications   omeprazole (PRILOSEC) 20 MG capsule    Sig: Take 1 capsule (20 mg total) by mouth daily.    Dispense:  30 capsule    Refill:  0    Order Specific Question:   Supervising Provider    Answer:   BURNS, STACY J [[id number]]   fluticasone (FLONASE) 50 MCG/ACT nasal spray    Sig: Place 2 sprays into both nostrils daily.    Dispense:  16 g    Refill:  0    Order Specific Question:   Supervising Provider    Answer:   Binnie Rail [id number]    Patient to follow-up in 6 weeks, or sooner as needed.  Ailene Ards, NP

## 2022-01-01 ENCOUNTER — Other Ambulatory Visit: Payer: Self-pay | Admitting: Nurse Practitioner

## 2022-01-01 DIAGNOSIS — E875 Hyperkalemia: Secondary | ICD-10-CM

## 2022-01-14 ENCOUNTER — Other Ambulatory Visit (INDEPENDENT_AMBULATORY_CARE_PROVIDER_SITE_OTHER): Payer: BC Managed Care – PPO

## 2022-01-14 DIAGNOSIS — E875 Hyperkalemia: Secondary | ICD-10-CM

## 2022-01-14 LAB — BASIC METABOLIC PANEL
BUN: 13 mg/dL (ref 6–23)
CO2: 29 mEq/L (ref 19–32)
Calcium: 9.3 mg/dL (ref 8.4–10.5)
Chloride: 106 mEq/L (ref 96–112)
Creatinine, Ser: 0.9 mg/dL (ref 0.40–1.20)
GFR: 60.87 mL/min (ref >60.00)
Glucose, Bld: 93 mg/dL (ref 70–99)
Potassium: 4.3 mEq/L (ref 3.5–5.1)
Sodium: 141 mEq/L (ref 135–145)

## 2022-01-19 ENCOUNTER — Other Ambulatory Visit: Payer: Self-pay | Admitting: Internal Medicine

## 2022-01-19 DIAGNOSIS — M1A09X0 Idiopathic chronic gout, multiple sites, without tophus (tophi): Secondary | ICD-10-CM

## 2022-01-19 NOTE — Telephone Encounter (Signed)
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

## 2022-02-04 ENCOUNTER — Institutional Professional Consult (permissible substitution): Payer: BC Managed Care – PPO | Admitting: Internal Medicine

## 2022-02-11 ENCOUNTER — Ambulatory Visit (INDEPENDENT_AMBULATORY_CARE_PROVIDER_SITE_OTHER): Payer: BC Managed Care – PPO | Admitting: Nurse Practitioner

## 2022-02-11 ENCOUNTER — Encounter: Payer: Self-pay | Admitting: Nurse Practitioner

## 2022-02-11 VITALS — BP 128/76 | HR 75 | Temp 98.5°F | Ht 64.0 in | Wt 198.0 lb

## 2022-02-11 DIAGNOSIS — R053 Chronic cough: Secondary | ICD-10-CM

## 2022-02-11 DIAGNOSIS — K529 Noninfective gastroenteritis and colitis, unspecified: Secondary | ICD-10-CM | POA: Insufficient documentation

## 2022-02-11 DIAGNOSIS — E669 Obesity, unspecified: Secondary | ICD-10-CM | POA: Insufficient documentation

## 2022-02-11 DIAGNOSIS — M1A09X0 Idiopathic chronic gout, multiple sites, without tophus (tophi): Secondary | ICD-10-CM | POA: Diagnosis not present

## 2022-02-11 DIAGNOSIS — N182 Chronic kidney disease, stage 2 (mild): Secondary | ICD-10-CM | POA: Diagnosis not present

## 2022-02-11 MED ORDER — OMEPRAZOLE 20 MG PO CPDR: 20.0000 mg | DELAYED_RELEASE_CAPSULE | Freq: Every day | ORAL | 0 refills | Status: DC | PRN

## 2022-02-11 MED ORDER — METHYLPREDNISOLONE 4 MG PO TBPK: ORAL_TABLET | ORAL | 1 refills | Status: DC

## 2022-02-11 NOTE — Assessment & Plan Note (Addendum)
Acute, resolving.  Patient told to focus on hydration and to follow a brat diet.  She was told to let me know if symptoms persist especially if she spikes fever, has worsening abdominal pain, has bloody diarrhea, feels extreme fatigue, etc.  She reports her understanding.  Encouraged to take Pepto-Bismol as needed for symptom management.

## 2022-02-11 NOTE — Assessment & Plan Note (Signed)
Chronic, patient actively try to lose weight.  She is encouraged to focus on lifestyle modification through diet and physical activity.  We did discuss considering intermittent fasting and she was given recommendations to read the obesity code if interested.  We also discussed pharmacological treatment, but patient like to focus on diet.  She was also encouraged to make sure she is getting enough micronutrients in her diet.  She reports understanding.

## 2022-02-11 NOTE — Assessment & Plan Note (Addendum)
Seems to have resolved since trialing the omeprazole.  Patient currently off of omeprazole for now.  We discussed risk versus benefits of restarting if chronic cough returns (long-term use associated with osteoporosis and some stomach cancers).  She reports her understanding and will continue off of it for the foreseeable future unless cough returns.

## 2022-02-11 NOTE — Patient Instructions (Signed)
The Obesity Code - Dr. Jason Fung 

## 2022-02-11 NOTE — Progress Notes (Signed)
Established Patient Office Visit  Subjective   Patient ID: Carmen Brown, female    DOB: January 25, 1941  Age: 81 y.o. MRN: [id number]  Chief Complaint  Patient presents with   Cough    Chronic Cough: Chest xray ordered last office visit was clear. Trialed omeprazole to see if she has silent GERD.  Trial of omeprazole resolved cough.  Abdominal Cramping: She had episode of abdominal cramping with some loose stools over the weekend.  She had chills 1 day, but denies any bloody diarrhea or severe pain.  She reports that symptoms are improving.  CKD Stage 2: Blood work collected at last office visit which showed chronic kidney disease stage II.  Patient continues on lisinopril, and tries to focus on water intake.  Obesity: Patient actively working on weight loss, she would like to focus on lifestyle modification as opposed to pharmacological treatment.       Review of Systems  Constitutional:  Negative for fever.  Respiratory:  Negative for cough.   Cardiovascular:  Negative for chest pain.  Gastrointestinal:  Positive for abdominal pain. Negative for blood in stool, constipation and diarrhea.      Objective:     BP 128/76 (BP Location: Right Arm, Patient Position: Sitting, Cuff Size: Large)   Pulse 75   Temp 98.5 F (36.9 C) (Oral)   Ht '5\' 4"'  (1.626 m)   Wt 198 lb (89.8 kg)   SpO2 95%   BMI 33.99 kg/m  BP Readings from Last 3 Encounters:  02/11/22 128/76  12/31/21 130/84  07/23/21 124/82   Wt Readings from Last 3 Encounters:  02/11/22 198 lb (89.8 kg)  12/31/21 204 lb (92.5 kg)  07/23/21 202 lb 9.6 oz (91.9 kg)      Physical Exam Vitals reviewed.  Constitutional:      General: She is not in acute distress.    Appearance: Normal appearance.  HENT:     Head: Normocephalic and atraumatic.  Neck:     Vascular: No carotid bruit.  Cardiovascular:     Rate and Rhythm: Normal rate and regular rhythm.     Pulses: Normal pulses.     Heart sounds: Normal heart  sounds.  Pulmonary:     Effort: Pulmonary effort is normal.     Breath sounds: Normal breath sounds.  Skin:    General: Skin is warm and dry.  Neurological:     General: No focal deficit present.     Mental Status: She is alert and oriented to person, place, and time.  Psychiatric:        Mood and Affect: Mood normal.        Behavior: Behavior normal.        Judgment: Judgment normal.      No results found for any visits on 02/11/22.  Last metabolic panel Lab Results  Component Value Date   GLUCOSE 93 01/14/2022   NA 141 01/14/2022   K 4.3 01/14/2022   CL 106 01/14/2022   CO2 29 01/14/2022   BUN 13 01/14/2022   CREATININE 0.90 01/14/2022   EGFR 65 07/23/2021   CALCIUM 9.3 01/14/2022   PROT 7.5 12/31/2021   ALBUMIN 4.3 12/31/2021   LABGLOB 2.6 02/28/2020   AGRATIO 1.7 02/28/2020   BILITOT 0.7 12/31/2021   ALKPHOS 89 12/31/2021   AST 44 (H) 12/31/2021   ALT 40 (H) 12/31/2021   ANIONGAP 7 05/15/2018   Last lipids Lab Results  Component Value Date   CHOL 101 12/31/2021  HDL 35.20 (L) 12/31/2021   LDLCALC 46 08/29/2019   LDLDIRECT 49.0 12/31/2021   TRIG 240.0 (H) 12/31/2021   CHOLHDL 3 12/31/2021      The ASCVD Risk score (Arnett DK, et al., 2019) failed to calculate for the following reasons:   The valid total cholesterol range is 130 to 320 mg/dL    Assessment & Plan:   Problem List Items Addressed This Visit       Digestive   Gastroenteritis    Acute, resolving.  Patient told to focus on hydration and to follow a brat diet.  She was told to let me know if symptoms persist especially if she spikes fever, has worsening abdominal pain, has bloody diarrhea, feels extreme fatigue, etc.  She reports her understanding.  Encouraged to take Pepto-Bismol as needed for symptom management.        Genitourinary   Stage 2 chronic kidney disease    Chronic, stable.  Patient encouraged to focus on lifestyle modifications such as drinking plenty of water throughout  the day to maintain hydration, and avoid using NSAIDs for pain management.  She reports her understanding.  We will continue to monitor and consider checking for albuminuria at subsequent office visit.  Patient does continue on lisinopril and is tolerating this well.        Other   Idiopathic gout of multiple sites (Chronic)   Relevant Medications   methylPREDNISolone (MEDROL DOSEPAK) 4 MG TBPK tablet   Chronic cough - Primary    Seems to have resolved since trialing the omeprazole.  Patient currently off of omeprazole for now.  We discussed risk versus benefits of restarting if chronic cough returns (long-term use associated with osteoporosis and some stomach cancers).  She reports her understanding and will continue off of it for the foreseeable future unless cough returns.      Relevant Medications   omeprazole (PRILOSEC) 20 MG capsule   Obesity (BMI 30-39.9)    Chronic, patient actively try to lose weight.  She is encouraged to focus on lifestyle modification through diet and physical activity.  We did discuss considering intermittent fasting and she was given recommendations to read the obesity code if interested.  We also discussed pharmacological treatment, but patient like to focus on diet.  She was also encouraged to make sure she is getting enough micronutrients in her diet.  She reports understanding.       Return in about 3 months (around 05/14/2022) for CPE with Judson Roch.    Ailene Ards, NP

## 2022-02-11 NOTE — Assessment & Plan Note (Signed)
Chronic, stable.  Patient encouraged to focus on lifestyle modifications such as drinking plenty of water throughout the day to maintain hydration, and avoid using NSAIDs for pain management.  She reports her understanding.  We will continue to monitor and consider checking for albuminuria at subsequent office visit.  Patient does continue on lisinopril and is tolerating this well.

## 2022-02-16 ENCOUNTER — Encounter: Payer: Self-pay | Admitting: Nurse Practitioner

## 2022-02-17 ENCOUNTER — Other Ambulatory Visit: Payer: Self-pay | Admitting: Nurse Practitioner

## 2022-02-17 DIAGNOSIS — R109 Unspecified abdominal pain: Secondary | ICD-10-CM

## 2022-04-08 ENCOUNTER — Telehealth: Payer: Self-pay

## 2022-04-08 NOTE — Telephone Encounter (Signed)
Left message for patient that we faxed her FMLA form. She will be in next week to pick up the paper form.

## 2022-04-10 ENCOUNTER — Other Ambulatory Visit: Payer: Self-pay | Admitting: Cardiology

## 2022-05-20 ENCOUNTER — Ambulatory Visit: Payer: BC Managed Care – PPO | Admitting: Nurse Practitioner

## 2022-05-20 VITALS — BP 134/82 | HR 80 | Temp 98.2°F | Ht 64.0 in | Wt 203.0 lb

## 2022-05-20 DIAGNOSIS — R0683 Snoring: Secondary | ICD-10-CM | POA: Diagnosis not present

## 2022-05-20 DIAGNOSIS — Z6835 Body mass index (BMI) 35.0-35.9, adult: Secondary | ICD-10-CM

## 2022-05-20 DIAGNOSIS — Z23 Encounter for immunization: Secondary | ICD-10-CM

## 2022-05-20 NOTE — Patient Instructions (Signed)
1100-1200 cal/day, use my fitness pal on your phone to help counting calories, use a food scale to monitor portion sizes, try to exercise 150 min/week as tolerated.

## 2022-05-20 NOTE — Progress Notes (Signed)
Established Patient Office Visit  Subjective   Patient ID: Carmen Brown, female    DOB: November 19, 1940  Age: 81 y.o. MRN: [id number]  Chief Complaint  Patient presents with   Follow-up    Weight issues     Patient arrives for follow-up for the above.  Obesity: Has been trying to diet on her own while attempting to lose weight.  She has been using a weight loss program called [employer] which unfortunately led to diarrhea.  So she stopped using their products.  Reports having had good results with weight loss in the past with calorie deficit and physical activity.  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  When asked for more information she does report snoring, denies any known apneic episodes.  Currently sleeps alone.  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    Review of Systems  Respiratory:  Negative for shortness of breath.   Cardiovascular:  Negative for chest pain.  Neurological:  Negative for dizziness.      Objective:     BP 134/82   Pulse 80   Temp 98.2 F (36.8 C) (Oral)   Ht 5\' 4"  (1.626 m)   Wt 203 lb (92.1 kg)   SpO2 95%   BMI 34.84 kg/m  BP Readings from Last 3 Encounters:  05/20/22 134/82  02/11/22 128/76  12/31/21 130/84   Wt Readings from Last 3 Encounters:  05/20/22 203 lb (92.1 kg)  02/11/22 198 lb (89.8 kg)  12/31/21 204 lb (92.5 kg)      Physical Exam Vitals reviewed.  Constitutional:      General: She is not in acute distress.    Appearance: Normal appearance.  HENT:     Head: Normocephalic and atraumatic.  Neck:     Vascular: No carotid bruit.  Cardiovascular:     Rate and Rhythm: Normal rate and regular rhythm.     Pulses: Normal pulses.     Heart sounds: Normal heart sounds.  Pulmonary:     Effort: Pulmonary effort is normal.     Breath sounds:  Normal breath sounds.  Skin:    General: Skin is warm and dry.  Neurological:     General: No focal deficit present.     Mental Status: She is alert and oriented to person, place, and time.  Psychiatric:        Mood and Affect: Mood normal.        Behavior: Behavior normal.        Judgment: Judgment normal.      No results found for any visits on 05/20/22.    The ASCVD Risk score (Arnett DK, et al., 2019) failed to calculate for the following reasons:   The valid total cholesterol range is 130 to 320 mg/dL    Assessment & Plan:   Problem List Items Addressed This Visit       Other   Need for vaccination - Primary    Please administered today, VIS provided.      Relevant Orders   Flu Vaccine QUAD High Dose(Fluad) (Completed)   Class 2 severe obesity with serious comorbidity and body mass index (BMI) of 35.0 to 35.9 in adult [place] [facility])    Chronic, recommend lifestyle modification.  Recommend calorie deficit, increasing physical activity to 150 minutes/week as tolerated.  Did discuss pharmacological management however GLP-1 agonist currently out of stock and patient not eligible for use of phentermine due to coronary artery disease.  If patient continues to have difficulty with weight loss may need to refer to healthy weight and wellness center.      Snoring    Has multiple risk factors for possible sleep apnea, recommended evaluation with neurology to undergo sleep study.  At this point patient would like to refuse this referral, she was educated on risks associated with untreated sleep apnea and reports her understanding.       Return in about 3 months (around 08/20/2022) for F/U with Jerick Khachatryan.    Elenore Paddy, NP

## 2022-05-20 NOTE — Assessment & Plan Note (Signed)
Has multiple risk factors for possible sleep apnea, recommended evaluation with neurology to undergo sleep study.  At this point patient would like to refuse this referral, she was educated on risks associated with untreated sleep apnea and reports her understanding.

## 2022-05-20 NOTE — Assessment & Plan Note (Signed)
Please administered today, VIS provided.

## 2022-05-20 NOTE — Assessment & Plan Note (Signed)
Chronic, recommend lifestyle modification.  Recommend calorie deficit, increasing physical activity to 150 minutes/week as tolerated.  Did discuss pharmacological management however GLP-1 agonist currently out of stock and patient not eligible for use of phentermine due to coronary artery disease.  If patient continues to have difficulty with weight loss may need to refer to healthy weight and wellness center.

## 2022-07-09 ENCOUNTER — Other Ambulatory Visit: Payer: Self-pay | Admitting: Cardiology

## 2022-07-21 NOTE — Progress Notes (Signed)
Office Visit Note  Patient: Carmen Brown             Date of Birth: [birth year]           MRN: [id number]             PCP: Elenore Paddy, NP Referring: Lezlie Lye, Meda Coffee, * Visit Date: 07/22/2022   Subjective:  Follow-up (Doing good)   History of Present Illness: Carmen Brown is a 81 y.o. female here for follow up for gout on allopurinol 300 mg daily. She has been doing well without  flare ups most of this year just having issue at the right foot. 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 She sees orthopedics clinic for bilateral knee injections every 6 months with good benefit.  Previous HPI 07/23/21 Carmen Brown is a 81 y.o. female here for follow up for gout on allopurinol 300 mg daily. Overall doing well she had COVID this summer and stopped taking colchicine with concern about medications and side effects at that time. She started taking tart cherry supplement regularly. She has only had one flare in the right foot for which she took steroids which resolved.   Previous HPI 01/13/21 Carmen Brown is a 81 y.o. female here for follow up for gout currently on allopurinol 300 mg p.o. daily and colchicine 0.6 mg daily.  Symptoms are doing pretty well she has not experienced any significant flare or attacks since the last visit.  She has noticed some increased weight gain attributes this to a lot of difficulty maintaining a good or balanced diet while constantly traveling.  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   Review of Systems  Respiratory:  Negative for difficulty breathing.   Gastrointestinal:  Negative for diarrhea.  Musculoskeletal:  Positive for joint pain and joint pain. Negative for joint swelling.  Skin:  Negative  for rash.  Hematological:  Negative for bruising/bleeding tendency.    PMFS History:  Patient Active Problem List   Diagnosis Date Noted   Need for vaccination 05/20/2022   Class 2 severe obesity with serious comorbidity and body mass index (BMI) of 35.0 to 35.9 in adult [facility]) 05/20/2022   Snoring 05/20/2022   Gastroenteritis 02/11/2022   Stage 2 chronic kidney disease 02/11/2022   Obesity (BMI 30-39.9) 02/11/2022   Chronic cough 12/31/2021   Dysuria 12/31/2021   Hyperglycemia 12/31/2021   Abnormal weight gain 01/22/2021   Colon cancer screening 01/22/2021   Pain of left hand 10/12/2020   Idiopathic gout of multiple sites 05/21/2020   Generalized osteoarthritis 05/21/2020   SOB (shortness of breath) 11/25/2019   Fatigue 11/25/2019   Educated about COVID-19 virus infection 08/28/2019   Dyslipidemia 08/20/2018   Medication management 11/20/2017   Hyperlipidemia 11/20/2017   CAD (coronary artery disease) 09/09/2017   Unstable angina (HCC) 09/07/2017   Osteoarthritis of left knee 06/08/2016   Osteoarthritis of right knee 06/08/2016   Bilateral carpal tunnel syndrome 06/08/2016   Degenerative disc disease, lumbar 04/06/2015   Chest pressure 11/05/2012   PAROXYSMAL VENTRICULAR TACHYCARDIA 08/21/2009   OBESITY, NOS 10/12/2006   HYPERTENSION, BENIGN SYSTEMIC 10/12/2006   CARDIOMEGALY 10/12/2006   RHINITIS, ALLERGIC 10/12/2006   MENOPAUSAL SYNDROME 10/12/2006   OSTEOARTHRITIS, LOWER LEG 10/12/2006  CERVICAL SPINE DISORDER, NOS 10/12/2006    Past Medical History:  Diagnosis Date   Allergy    Arthritis    CAD (coronary artery disease)    95% proximal LAD stenosis with DES.  Jan 2019   Cardiomegaly    Cardiomyopathy (HCC)    EF 35% in the distant past but NL EF 2009   Hypertension, benign    systemic   Menopausal syndrome    Obesity    Osteoarthritis of lower leg, localized    Paroxysmal VT (HCC)    Thyroid disease     Family History  Problem Relation Age of Onset    Heart disease Mother 55       MI   Heart disease Father 22       CABG   CAD Sister 13       Stent   Lymphoma Sister    Stroke Maternal Grandmother    Diabetes Maternal Grandfather    Diabetes Maternal Uncle    Past Surgical History:  Procedure Laterality Date   CARPAL TUNNEL RELEASE Right 11/2016   CATARACT EXTRACTION  2020   Per patient   LEFT HEART CATH AND CORONARY ANGIOGRAPHY N/A 09/08/2017   Procedure: LEFT HEART CATH AND CORONARY ANGIOGRAPHY;  Surgeon: Yvonne Kendall, MD;  [facility];  Service: Cardiovascular;  Laterality: N/A;   MASS EXCISION  03/15/2012   Procedure: MINOR EXCISION OF MASS;  Surgeon: Wyn Forster., MD;  Location: [facility];  Service: Orthopedics;  Laterality: Right;  debride IP right long finger, excision mucoid cyst   ventricular tacticartia     Social History   Social History Narrative   Financial controller for [employer].  Education: 41yrs.  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 Caffeine 2 cups decaf. (not every day).   Immunization History  Administered Date(s) Administered   Fluad Quad(high Dose 65+) 07/16/2020, 07/23/2021, 05/20/2022   Influenza Split 08/28/2011   Influenza, High Dose Seasonal PF 07/19/2018   Influenza,inj,Quad PF,6+ Mos 07/22/2014   Influenza-Unspecified 05/29/2016, 05/15/2017   Moderna Sars-Covid-2 Vaccination 09/13/2019, 10/09/2019, 05/15/2020   Pneumococcal Polysaccharide-23 07/28/2009   Td 07/15/2000, 02/21/2012   Tdap 02/21/2012   Zoster Recombinat (Shingrix) 08/20/2018, 01/31/2019   Zoster, Live 05/16/2011     Objective: Vital Signs: BP (!) 138/91 (BP Location: Left Arm, Patient Position: Sitting, Cuff Size: Normal)   Pulse 75   Ht 5' 4.5" (1.638 m)   Wt 192 lb (87.1 kg)   BMI 32.45 kg/m    Physical Exam Constitutional:      Appearance: She is obese.  Cardiovascular:     Rate and Rhythm: Normal rate and regular rhythm.  Pulmonary:     Effort: Pulmonary effort is normal.     Breath sounds: Normal  breath sounds.  Musculoskeletal:     Right lower leg: No edema.     Left lower leg: No edema.  Skin:    General: Skin is warm and dry.     Findings: No rash.  Neurological:     Mental Status: She is alert.  Psychiatric:        Mood and Affect: Mood normal.      Musculoskeletal Exam:  Shoulders full ROM no tenderness or swelling Elbows full ROM no tenderness or swelling Wrists full ROM no tenderness or swelling Fingers full ROM bouchard and heberdon's nodes throughout both hands Knees full ROM no tenderness or swelling, patellofemoral crepitus bilaterally Ankles full ROM no tenderness or swelling MTPs full ROM no tenderness  or swelling   Investigation: No additional findings.  Imaging: No results found.  Recent Labs: Lab Results  Component Value Date   WBC 7.5 12/31/2021   HGB 14.4 12/31/2021   PLT 227.0 12/31/2021   NA 141 01/14/2022   K 4.3 01/14/2022   CL 106 01/14/2022   CO2 29 01/14/2022   GLUCOSE 93 01/14/2022   BUN 13 01/14/2022   CREATININE 0.90 01/14/2022   BILITOT 0.7 12/31/2021   ALKPHOS 89 12/31/2021   AST 44 (H) 12/31/2021   ALT 40 (H) 12/31/2021   PROT 7.5 12/31/2021   ALBUMIN 4.3 12/31/2021   CALCIUM 9.3 01/14/2022   GFRAA 74 01/13/2021    Speciality Comments: No specialty comments available.  Procedures:  No procedures performed Allergies: Epinephrine and Latex   Assessment / Plan:     Visit Diagnoses: Idiopathic chronic gout of multiple sites without tophus - Plan: allopurinol (ZYLOPRIM) 300 MG tablet  Currently doing well has had some minor inflammation but very few major flareups this year.  Question about tapering or stopping allopurinol I recommend we continue the medication at this time.  I think he would be likely for her uric acid to rise above goal again if discontinuing completely although tapering may be a good option to try specially if we have another 6 to 12 months of minimal activity.  For now plan to continue allopurinol 300  mg daily.  Baseline labs in primary care office from several months ago look fine with no major subsequent medication changes.  Primary osteoarthritis of left knee Primary osteoarthritis of right knee Generalized osteoarthritis  No major complaints of symptoms associated with the bilateral hand osteoarthritis.  She is following up regularly in orthopedics clinic getting good relief with intra-articular steroid injections for knee osteoarthritis.  There is patellofemoral crepitus on exam but with good range of movement good strength minimal tenderness and no appreciable swelling.  Orders: No orders of the defined types were placed in this encounter.  Meds ordered this encounter  Medications   allopurinol (ZYLOPRIM) 300 MG tablet    Sig: TAKE 1 TABLET(300 MG) BY MOUTH DAILY    Dispense:  90 tablet    Refill:  1     Follow-Up Instructions: Return in about 6 months (around 01/21/2023) for Gout on allopurinol f/u 73mos.   Fuller Plan, MD  Note - This record has been created using [employer].  Chart creation errors have been sought, but may not always  have been located. Such creation errors do not reflect on  the standard of medical care.

## 2022-07-22 ENCOUNTER — Encounter: Payer: Self-pay | Admitting: Internal Medicine

## 2022-07-22 ENCOUNTER — Ambulatory Visit: Payer: BC Managed Care – PPO | Attending: Internal Medicine | Admitting: Internal Medicine

## 2022-07-22 VITALS — BP 138/91 | HR 75 | Ht 64.5 in | Wt 192.0 lb

## 2022-07-22 DIAGNOSIS — M1711 Unilateral primary osteoarthritis, right knee: Secondary | ICD-10-CM

## 2022-07-22 DIAGNOSIS — M1A09X0 Idiopathic chronic gout, multiple sites, without tophus (tophi): Secondary | ICD-10-CM

## 2022-07-22 DIAGNOSIS — M1712 Unilateral primary osteoarthritis, left knee: Secondary | ICD-10-CM

## 2022-07-22 DIAGNOSIS — M159 Polyosteoarthritis, unspecified: Secondary | ICD-10-CM

## 2022-07-22 MED ORDER — ALLOPURINOL 300 MG PO TABS: ORAL_TABLET | ORAL | 1 refills | Status: DC

## 2022-08-02 NOTE — Progress Notes (Unsigned)
Cardiology Office Note   Date:  08/04/2022   ID:  Carah, Barrientes 04/24/1941, MRN [id number]  PCP:  Elenore Paddy, NP  Cardiologist:   Rollene Rotunda, MD   Chief Complaint  Patient presents with   Coronary Artery Disease       History of Present Illness: Carmen Brown is a 81 y.o. female who presents for follow up of CAD.  She was admitted with [place] in Jan 2019.  She was found to have 95% LAD stenosis treated stent.  EF was normal.    When I last saw her she in 2019 she had SOB and I ordered a POET (Plain Old Exercise Treadmill) which was negative for ischemia in August 2021.    Since I last saw her she has done well.  The patient denies any new symptoms such as chest discomfort, neck or arm discomfort. There has been no new shortness of breath, PND or orthopnea. There have been no reported palpitations, presyncope or syncope.   She is still working as a Financial controller.        Past Medical History:  Diagnosis Date   Allergy    Arthritis    CAD (coronary artery disease)    95% proximal LAD stenosis with DES.  Jan 2019   Cardiomegaly    Cardiomyopathy (HCC)    EF 35% in the distant past but NL EF 2009   Hypertension, benign    systemic   Menopausal syndrome    Obesity    Osteoarthritis of lower leg, localized    Paroxysmal VT (HCC)    Thyroid disease     Past Surgical History:  Procedure Laterality Date   CARPAL TUNNEL RELEASE Right 11/2016   CATARACT EXTRACTION  2020   Per patient   LEFT HEART CATH AND CORONARY ANGIOGRAPHY N/A 09/08/2017   Procedure: LEFT HEART CATH AND CORONARY ANGIOGRAPHY;  Surgeon: Yvonne Kendall, MD;  [facility];  Service: Cardiovascular;  Laterality: N/A;   MASS EXCISION  03/15/2012   Procedure: MINOR EXCISION OF MASS;  Surgeon: Wyn Forster., MD;  Location: [facility];  Service: Orthopedics;  Laterality: Right;  debride IP right long finger, excision mucoid cyst   ventricular tacticartia        Current Outpatient Medications  Medication Sig Dispense Refill   allopurinol (ZYLOPRIM) 300 MG tablet TAKE 1 TABLET(300 MG) BY MOUTH DAILY 90 tablet 1   amLODipine (NORVASC) 2.5 MG tablet Take 1 tablet (2.5 mg total) by mouth 2 (two) times daily. 180 tablet 3   aspirin EC 81 MG tablet Take 1 tablet (81 mg total) by mouth daily. 90 tablet 3   atorvastatin (LIPITOR) 40 MG tablet TAKE 1 TABLET BY MOUTH EVERY DAY AT 6 PM 90 tablet 3   Biotin 5000 MCG CAPS Take 1 capsule by mouth daily. Takes daily     fluticasone (FLONASE) 50 MCG/ACT nasal spray Place 2 sprays into both nostrils daily. 16 g 0   Glucosamine HCl (GLUCOSAMINE PO) Take by mouth.     lisinopril (ZESTRIL) 20 MG tablet TAKE 1 TABLET(20 MG) BY MOUTH TWICE DAILY 60 tablet 0   loratadine (CLARITIN) 10 MG tablet Take 10 mg by mouth daily.     Magnesium Citrate 100 MG TABS Take by mouth.     metoprolol succinate (TOPROL-XL) 25 MG 24 hr tablet TAKE 1 TABLET(25 MG) BY MOUTH DAILY 90 tablet 3   Multiple Vitamin (MULTIVITAMIN) tablet Take 1  tablet by mouth daily.     nitroGLYCERIN (NITROSTAT) 0.4 MG SL tablet Place 1 tablet (0.4 mg total) under the tongue every 5 (five) minutes x 3 doses as needed for chest pain. 25 tablet 2   OMEGA MONOPURE CURCUMIN EC 125-600 MG CPDR      Zoster Vaccine Adjuvanted [facility]) injection      No current facility-administered medications for this visit.    Allergies:   Epinephrine and Latex    ROS:  Please see the history of present illness.   Otherwise, review of systems are positive for none.   All other systems are reviewed and negative.    PHYSICAL EXAM: VS:  BP 135/86   Pulse (!) 59  , BMI There is no height or weight on file to calculate BMI.  GENERAL:  Well appearing NECK:  No jugular venous distention, waveform within normal limits, carotid upstroke brisk and symmetric, no bruits, no thyromegaly LUNGS:  Clear to auscultation bilaterally CHEST:  Unremarkable HEART:  PMI not displaced or  sustained,S1 and S2 within normal limits, no S3, no S4, no clicks, no rubs, no murmurs ABD:  Flat, positive bowel sounds normal in frequency in pitch, no bruits, no rebound, no guarding, no midline pulsatile mass, no hepatomegaly, no splenomegaly EXT:  2 plus pulses throughout, no edema, no cyanosis no clubbing   EKG:  EKG is   ordered today. Sinus rhythm, rate 59, axis within normal limits, intervals within normal limits, no acute ST-T wave changes.  Recent Labs: 12/31/2021: ALT 40; Hemoglobin 14.4; Platelets 227.0; TSH 5.04 01/14/2022: BUN 13; Creatinine, Ser 0.90; Potassium 4.3; Sodium 141    Lipid Panel    Component Value Date/Time   CHOL 101 12/31/2021 1438   CHOL 114 08/29/2019 1023   TRIG 240.0 (H) 12/31/2021 1438   HDL 35.20 (L) 12/31/2021 1438   HDL 46 08/29/2019 1023   CHOLHDL 3 12/31/2021 1438   VLDL 48.0 (H) 12/31/2021 1438   LDLCALC 46 08/29/2019 1023   LDLDIRECT 49.0 12/31/2021 1438      Wt Readings from Last 3 Encounters:  07/22/22 192 lb (87.1 kg)  05/20/22 203 lb (92.1 kg)  02/11/22 198 lb (89.8 kg)      Other studies Reviewed: Additional studies/ records that were reviewed today include:   Labs Review of the above records demonstrates: See elsewhere  ASSESSMENT AND PLAN:   CAD:   She had a negative stress test in 2021. The patient has no new sypmtoms.  No further cardiovascular testing is indicated.  We will continue with aggressive risk reduction and meds as listed.  HTN:  The blood pressure is at target.  No change in therapy.  DYSLIPIDEMIA:   LDL 49 in May.  Her triglycerides are elevated to 240.  I suggested a low carbohydrate diet and then we can repeat this in about 3 months.  If not she probably needs fenofibrate.     Current medicines are reviewed at length with the patient today.  The patient does not have concerns regarding medicines.  The following changes have been made:  None  Labs/ tests ordered today include: None  Orders Placed  This Encounter  Procedures   Lipid panel   EKG 12-Lead     Disposition:   FU with me in 12 months.    Signed, Rollene Rotunda, MD  08/04/2022 8:12 AM    [facility]

## 2022-08-04 ENCOUNTER — Encounter: Payer: Self-pay | Admitting: Cardiology

## 2022-08-04 ENCOUNTER — Ambulatory Visit: Payer: BC Managed Care – PPO | Attending: Cardiology | Admitting: Cardiology

## 2022-08-04 VITALS — BP 135/86 | HR 59

## 2022-08-04 DIAGNOSIS — I10 Essential (primary) hypertension: Secondary | ICD-10-CM | POA: Diagnosis not present

## 2022-08-04 DIAGNOSIS — E785 Hyperlipidemia, unspecified: Secondary | ICD-10-CM | POA: Diagnosis not present

## 2022-08-04 DIAGNOSIS — I2510 Atherosclerotic heart disease of native coronary artery without angina pectoris: Secondary | ICD-10-CM

## 2022-08-04 NOTE — Patient Instructions (Addendum)
Medication Instructions:  Continue same medications *If you need a refill on your cardiac medications before your next appointment, please call your pharmacy*   Lab Work: Lipid Panel in 4 months Lab order enclosed   Testing/Procedures: None ordered   Follow-Up: At [facility], you and your health needs are our priority.  As part of our continuing mission to provide you with exceptional heart care, we have created designated Provider Care Teams.  These Care Teams include your primary Cardiologist (physician) and Advanced Practice Providers (APPs -  Physician Assistants and Nurse Practitioners) who all work together to provide you with the care you need, when you need it.  We recommend signing up for the patient portal called "MyChart".  Sign up information is provided on this After Visit Summary.  MyChart is used to connect with patients for Virtual Visits (Telemedicine).  Patients are able to view lab/test results, encounter notes, upcoming appointments, etc.  Non-urgent messages can be sent to your provider as well.   To learn more about what you can do with MyChart, go to [email or web address].    Your next appointment:  1 year    Call in Sept to schedule Dec appointment     The format for your next appointment: Office     Provider:  Dr.Hochrein   Important Information About Sugar

## 2022-08-06 ENCOUNTER — Encounter (HOSPITAL_COMMUNITY): Payer: Self-pay

## 2022-08-06 ENCOUNTER — Emergency Department (HOSPITAL_COMMUNITY): Payer: BC Managed Care – PPO

## 2022-08-06 ENCOUNTER — Emergency Department (HOSPITAL_COMMUNITY)
Admission: EM | Admit: 2022-08-06 | Discharge: 2022-08-06 | Disposition: A | Discharge status: Home / Self Care | Payer: BC Managed Care – PPO | Attending: Emergency Medicine | Admitting: Emergency Medicine

## 2022-08-06 DIAGNOSIS — Z7982 Long term (current) use of aspirin: Secondary | ICD-10-CM | POA: Diagnosis not present

## 2022-08-06 DIAGNOSIS — R072 Precordial pain: Secondary | ICD-10-CM | POA: Diagnosis not present

## 2022-08-06 DIAGNOSIS — I2510 Atherosclerotic heart disease of native coronary artery without angina pectoris: Secondary | ICD-10-CM | POA: Insufficient documentation

## 2022-08-06 DIAGNOSIS — Z91040 Latex allergy status: Secondary | ICD-10-CM | POA: Insufficient documentation

## 2022-08-06 DIAGNOSIS — Z79899 Other long term (current) drug therapy: Secondary | ICD-10-CM | POA: Insufficient documentation

## 2022-08-06 DIAGNOSIS — R61 Generalized hyperhidrosis: Secondary | ICD-10-CM | POA: Insufficient documentation

## 2022-08-06 DIAGNOSIS — I10 Essential (primary) hypertension: Secondary | ICD-10-CM | POA: Insufficient documentation

## 2022-08-06 LAB — CBC WITH DIFFERENTIAL/PLATELET
Abs Immature Granulocytes: 0.03 10*3/uL (ref 0.00–0.07)
Basophils Absolute: 0.1 10*3/uL (ref 0.0–0.1)
Basophils Relative: 1 %
Eosinophils Absolute: 0.4 10*3/uL (ref 0.0–0.5)
Eosinophils Relative: 5 %
HCT: 39 % (ref 36.0–46.0)
Hemoglobin: 13.6 g/dL (ref 12.0–15.0)
Immature Granulocytes: 0 %
Lymphocytes Relative: 28 %
Lymphs Abs: 2.1 10*3/uL (ref 0.7–4.0)
MCH: 33.6 pg (ref 26.0–34.0)
MCHC: 34.9 g/dL (ref 30.0–36.0)
MCV: 96.3 fL (ref 80.0–100.0)
Monocytes Absolute: 0.8 10*3/uL (ref 0.1–1.0)
Monocytes Relative: 11 %
Neutro Abs: 4.2 10*3/uL (ref 1.7–7.7)
Neutrophils Relative %: 55 %
Platelets: 185 10*3/uL (ref 150–400)
RBC: 4.05 MIL/uL (ref 3.87–5.11)
RDW: 12.5 % (ref 11.5–15.5)
WBC: 7.6 10*3/uL (ref 4.0–10.5)
nRBC: 0 % (ref 0.0–0.2)

## 2022-08-06 LAB — COMPREHENSIVE METABOLIC PANEL
ALT: 23 U/L (ref 0–44)
AST: 23 U/L (ref 15–41)
Albumin: 3.6 g/dL (ref 3.5–5.0)
Alkaline Phosphatase: 84 U/L (ref 38–126)
Anion gap: 8 (ref 5–15)
BUN: 15 mg/dL (ref 8–23)
CO2: 24 mmol/L (ref 22–32)
Calcium: 9 mg/dL (ref 8.9–10.3)
Chloride: 108 mmol/L (ref 98–111)
Creatinine, Ser: 0.81 mg/dL (ref 0.44–1.00)
GFR, Estimated: >60 mL/min (ref >60)
Glucose, Bld: 115 mg/dL — ABNORMAL HIGH (ref 70–99)
Potassium: 3.5 mmol/L (ref 3.5–5.1)
Sodium: 140 mmol/L (ref 135–145)
Total Bilirubin: 0.4 mg/dL (ref 0.3–1.2)
Total Protein: 6.5 g/dL (ref 6.5–8.1)

## 2022-08-06 LAB — TROPONIN I (HIGH SENSITIVITY)
Troponin I (High Sensitivity): 4 ng/L (ref <18)
Troponin I (High Sensitivity): 4 ng/L (ref <18)

## 2022-08-06 NOTE — ED Provider Notes (Signed)
Carmen Brown   CSN: [id number] Arrival date & time: 08/06/22  0122     History  Chief Complaint  Patient presents with   Chest Pain    Pt presents to ED for evaluation of sharp chest pain with hx of stents in 2019. Pt was found in NSR by ems and reported taking ASA and NTG prior to EMS arrival. Pt had reported intermittent episodes of bradycardia by EMS during transport.     Carmen Brown is a 81 y.o. female.  The history is provided by the patient, the EMS personnel and medical records.  Chest Pain Carmen Brown is a 81 y.o. female who presents to the Emergency Department complaining of chest pain.  She presents to the emergency department by EMS for evaluation of chest pain that started around 1145 this evening when she was getting ready for bed.  She describes it as a electric/shock type pain located on her left chest around her heart that comes and goes.  Her initial episode had associated diaphoresis.  No associated shortness of breath, abdominal pain, nausea, vomiting.  She took a baby aspirin at home as well as one of her nitroglycerin but had ongoing pain and EMS was called.  She received 324 of ASA by EMS as well as 1 sublingual nitroglycerin.  She states that her pain is now better but not 100% resolved.  She does have intermittent episodes.  She has a history of hypertension, coronary artery disease status post stenting in 2019, hyperlipidemia.     Home Medications Prior to Admission medications   Medication Sig Start Date End Date Taking? Authorizing Provider  allopurinol (ZYLOPRIM) 300 MG tablet TAKE 1 TABLET(300 MG) BY MOUTH DAILY 07/22/22   Rice, Jamesetta Orleans, MD  amLODipine (NORVASC) 2.5 MG tablet Take 1 tablet (2.5 mg total) by mouth 2 (two) times daily. 07/23/21   Rollene Rotunda, MD  aspirin EC 81 MG tablet Take 1 tablet (81 mg total) by mouth daily. 07/23/21   Rollene Rotunda, MD  atorvastatin (LIPITOR) 40 MG  tablet TAKE 1 TABLET BY MOUTH EVERY DAY AT 6 PM 08/17/21   Rollene Rotunda, MD  Biotin 5000 MCG CAPS Take 1 capsule by mouth daily. Takes daily    [provider]  fluticasone (FLONASE) 50 MCG/ACT nasal spray Place 2 sprays into both nostrils daily. 12/31/21   Elenore Paddy, NP  Glucosamine HCl (GLUCOSAMINE PO) Take by mouth.    [provider]  lisinopril (ZESTRIL) 20 MG tablet TAKE 1 TABLET(20 MG) BY MOUTH TWICE DAILY 07/12/22   Rollene Rotunda, MD  loratadine (CLARITIN) 10 MG tablet Take 10 mg by mouth daily.    [provider]  Magnesium Citrate 100 MG TABS Take by mouth.    [provider]  metoprolol succinate (TOPROL-XL) 25 MG 24 hr tablet TAKE 1 TABLET(25 MG) BY MOUTH DAILY 09/29/21   Rollene Rotunda, MD  Multiple Vitamin (MULTIVITAMIN) tablet Take 1 tablet by mouth daily.    [provider]  nitroGLYCERIN (NITROSTAT) 0.4 MG SL tablet Place 1 tablet (0.4 mg total) under the tongue every 5 (five) minutes x 3 doses as needed for chest pain. 09/09/17   Ellsworth Lennox, PA-C  OMEGA MONOPURE CURCUMIN EC 125-600 MG CPDR  10/29/21   [provider]  Zoster Vaccine Adjuvanted [facility]) injection     [provider]      Allergies    Epinephrine and Latex    Review  of Systems   Review of Systems  Cardiovascular:  Positive for chest pain.  All other systems reviewed and are negative.   Physical Exam Updated Vital Signs BP 131/85   Pulse 63   Temp 98.4 F (36.9 C) (Oral)   Resp 15   Ht 5' 4.5" (1.638 m)   Wt 87.1 kg   SpO2 97%   BMI 32.45 kg/m  Physical Exam Vitals and nursing [facility] reviewed.  Constitutional:      Appearance: She is well-developed.  HENT:     Head: Normocephalic and atraumatic.  Cardiovascular:     Rate and Rhythm: Normal rate and regular rhythm.     Heart sounds: No murmur heard. Pulmonary:     Effort: Pulmonary effort is normal. No respiratory distress.     Breath sounds: Normal breath sounds.   Abdominal:     Palpations: Abdomen is soft.     Tenderness: There is no abdominal tenderness. There is no guarding or rebound.  Musculoskeletal:        General: No swelling or tenderness.  Skin:    General: Skin is warm and dry.  Neurological:     Mental Status: She is alert and oriented to person, place, and time.  Psychiatric:        Behavior: Behavior normal.     ED Results / Procedures / Treatments   Labs (all labs ordered are listed, but only abnormal results are displayed) Labs Reviewed  COMPREHENSIVE METABOLIC PANEL - Abnormal; Notable for the following components:      Result Value   Glucose, Bld 115 (*)    All other components within normal limits  CBC WITH DIFFERENTIAL/PLATELET  TROPONIN I (HIGH SENSITIVITY)  TROPONIN I (HIGH SENSITIVITY)    EKG EKG Interpretation  Date/Time:  Saturday August 06 2022 01:29:59 EST Ventricular Rate:  69 PR Interval:  180 QRS Duration: 90 QT Interval:  394 QTC Calculation: 423 R Axis:   44 Text Interpretation: Sinus rhythm Low voltage, precordial leads Probable anteroseptal infarct, old Minimal ST elevation, inferior leads Confirmed by Tilden Fossa [phone]) on 08/06/2022 1:37:05 AM  Radiology DG Chest 2 View  Result Date: 08/06/2022 CLINICAL DATA:  Chest pain. EXAM: CHEST - 2 VIEW COMPARISON:  Chest radiograph dated 12/31/2021. FINDINGS: No focal consolidation, pleural effusion, or pneumothorax. Stable cardiac silhouette. Coronary vascular calcification or stent. No acute osseous pathology. IMPRESSION: No active cardiopulmonary disease. Electronically Signed   By: Elgie Collard M.D.   On: 08/06/2022 02:17    Procedures Procedures    Medications Ordered in ED Medications - No data to display  ED Course/ Medical Decision Making/ A&P                           Medical Decision Making Amount and/or Complexity of Data Reviewed Labs: ordered. Radiology: ordered.  Pt with hx/o CAD s/p DES in 2019 here for evaluation  of left sided chest pain.  EKG without acute ischemic changes, initial troponin wnl.  Heart score 4.  Patient is pain-free at time of ED reevaluation.  Second troponin is negative and stable.  Current clinical picture is not consistent with ACS, PE, dissection.  Her pain is very atypical for cardiac pain with brief sharp pain.  She is established with cardiology.  Feel she is stable for outpatient cardiology follow-up with return precautions.   Of [facility] did report that patient was sinus rhythm with rate in 80s and occasionally would have bradycardia  to the 30s.  She does not have any pain or dizziness with these bradycardic events.  Prehospital rhythm strips were reviewed with sinus rhythm and sinus bradycardia.  No evidence of life-threatening arrhythmia.         Final Clinical Impression(s) / ED Diagnoses Final diagnoses:  Precordial pain    Rx / DC Orders ED Discharge Orders     None         Tilden Fossa, MD 08/06/22 [phone]

## 2022-08-08 ENCOUNTER — Other Ambulatory Visit: Payer: Self-pay | Admitting: Cardiology

## 2022-08-08 DIAGNOSIS — R0602 Shortness of breath: Secondary | ICD-10-CM

## 2022-08-08 DIAGNOSIS — E785 Hyperlipidemia, unspecified: Secondary | ICD-10-CM

## 2022-08-08 DIAGNOSIS — I10 Essential (primary) hypertension: Secondary | ICD-10-CM

## 2022-08-08 DIAGNOSIS — I2510 Atherosclerotic heart disease of native coronary artery without angina pectoris: Secondary | ICD-10-CM

## 2022-08-10 ENCOUNTER — Telehealth: Payer: Self-pay

## 2022-08-10 ENCOUNTER — Telehealth: Payer: Self-pay | Admitting: Cardiology

## 2022-08-10 ENCOUNTER — Other Ambulatory Visit: Payer: Self-pay | Admitting: Cardiology

## 2022-08-10 DIAGNOSIS — I10 Essential (primary) hypertension: Secondary | ICD-10-CM

## 2022-08-10 DIAGNOSIS — I2510 Atherosclerotic heart disease of native coronary artery without angina pectoris: Secondary | ICD-10-CM

## 2022-08-10 DIAGNOSIS — E785 Hyperlipidemia, unspecified: Secondary | ICD-10-CM

## 2022-08-10 DIAGNOSIS — R0602 Shortness of breath: Secondary | ICD-10-CM

## 2022-08-10 NOTE — Telephone Encounter (Signed)
Pt has an upcoming appointment with provider on 08/26/22

## 2022-08-10 NOTE — Telephone Encounter (Signed)
Pt informed of providers result & recommendations. Pt verbalized understanding.

## 2022-08-10 NOTE — Telephone Encounter (Signed)
Patient states she was in the office last Thursday and she was doing fine, but Friday night she had an issue and called 911. She says she was in the ED, but was not admitted. She wants to know if she needs to follow up before going back to work.

## 2022-08-10 NOTE — Telephone Encounter (Signed)
Returned call to pt she states that she had her appt with Dr Antoine Poche 12-21 and the next Saturday she was having chest pain and HR into the 30's. So she called the ambulance and went to the ER. She was seen and had some testing done and was d/c per ER note:  "Her pain is very atypical for cardiac pain with brief sharp pain." She states that she is having no symptoms now or since. She will call back is anything else is needed. 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 She thinks that she is OK to do so. Informed pt that Dr Antoine Poche is out of the office for the holiday. I will forward to him and the DOD today for answer. I did make the 1st available appt. 09-23-22. She also has an appt scheduled with her PCP 08-26-22 as well.  She will go to the ER if needed.

## 2022-08-26 ENCOUNTER — Ambulatory Visit: Payer: BC Managed Care – PPO | Admitting: Nurse Practitioner

## 2022-08-26 VITALS — BP 118/84 | HR 83 | Temp 97.9°F | Ht 64.5 in | Wt 196.2 lb

## 2022-08-26 DIAGNOSIS — R053 Chronic cough: Secondary | ICD-10-CM | POA: Diagnosis not present

## 2022-08-26 DIAGNOSIS — G2581 Restless legs syndrome: Secondary | ICD-10-CM

## 2022-08-26 DIAGNOSIS — R7303 Prediabetes: Secondary | ICD-10-CM | POA: Insufficient documentation

## 2022-08-26 LAB — BASIC METABOLIC PANEL
BUN: 17 mg/dL (ref 6–23)
CO2: 28 mEq/L (ref 19–32)
Calcium: 9.8 mg/dL (ref 8.4–10.5)
Chloride: 102 mEq/L (ref 96–112)
Creatinine, Ser: 0.84 mg/dL (ref 0.40–1.20)
GFR: 65.84 mL/min (ref >60.00)
Glucose, Bld: 87 mg/dL (ref 70–99)
Potassium: 4.9 mEq/L (ref 3.5–5.1)
Sodium: 139 mEq/L (ref 135–145)

## 2022-08-26 LAB — IRON: Iron: 189 ug/dL — ABNORMAL HIGH (ref 42–145)

## 2022-08-26 LAB — CBC
HCT: 44.2 % (ref 36.0–46.0)
Hemoglobin: 14.9 g/dL (ref 12.0–15.0)
MCHC: 33.6 g/dL (ref 30.0–36.0)
MCV: 97.8 fl (ref 78.0–100.0)
Platelets: 243 10*3/uL (ref 150.0–400.0)
RBC: 4.52 Mil/uL (ref 3.87–5.11)
RDW: 13.1 % (ref 11.5–15.5)
WBC: 6.8 10*3/uL (ref 4.0–10.5)

## 2022-08-26 LAB — HEMOGLOBIN A1C: Hgb A1c MFr Bld: 5.8 % (ref 4.6–6.5)

## 2022-08-26 LAB — FERRITIN: Ferritin: 78.5 ng/mL (ref 10.0–291.0)

## 2022-08-26 MED ORDER — FLUTICASONE PROPIONATE 50 MCG/ACT NA SUSP: 2.0000 | Freq: Every day | NASAL | 0 refills | Status: DC

## 2022-08-26 NOTE — Progress Notes (Signed)
Established Patient Office Visit  Subjective   Patient ID: Carmen Brown, female    DOB: 1940-11-23  Age: 82 y.o. MRN: [id number]  Chief Complaint  Patient presents with   Restless leg syndrome    RLS: reports this has been an chronic issue from childhood. Occurs most nights has a sensation of having to move her legs which can cause discomfort. Has never taken medication for this. Prefers nonpharmacologic treatment options if possible.  Prediabetes: due for A1c check today.  Chronic cough: seems to have resolved, would like refill of flonase     Review of Systems  Respiratory:  Negative for shortness of breath.   Cardiovascular:  Negative for chest pain.  Neurological:  Negative for dizziness and loss of consciousness.       (+) restless leg symptoms      Objective:     BP 118/84   Pulse 83   Temp 97.9 F (36.6 C) (Temporal)   Ht 5' 4.5" (1.638 m)   Wt 196 lb 4 oz (89 kg)   SpO2 95%   BMI 33.17 kg/m  BP Readings from Last 3 Encounters:  08/26/22 118/84  08/06/22 131/85  08/04/22 135/86   Wt Readings from Last 3 Encounters:  08/26/22 196 lb 4 oz (89 kg)  08/06/22 192 lb (87.1 kg)  07/22/22 192 lb (87.1 kg)      Physical Exam Vitals reviewed.  Constitutional:      General: She is not in acute distress.    Appearance: Normal appearance.  HENT:     Head: Normocephalic and atraumatic.  Neck:     Vascular: No carotid bruit.  Cardiovascular:     Rate and Rhythm: Normal rate and regular rhythm.     Pulses: Normal pulses.     Heart sounds: Normal heart sounds.  Pulmonary:     Effort: Pulmonary effort is normal.     Breath sounds: Normal breath sounds.  Skin:    General: Skin is warm and dry.  Neurological:     General: No focal deficit present.     Mental Status: She is alert and oriented to person, place, and time.  Psychiatric:        Mood and Affect: Mood normal.        Behavior: Behavior normal.        Judgment: Judgment normal.      No  results found for any visits on 08/26/22.    The ASCVD Risk score (Arnett DK, et al., 2019) failed to calculate for the following reasons:   The valid total cholesterol range is 130 to 320 mg/dL    Assessment & Plan:   Problem List Items Addressed This Visit       Other   Chronic cough    No longer coughing, will refill flonase.       Relevant Medications   fluticasone (FLONASE) 50 MCG/ACT nasal spray   Restless leg syndrome - Primary    Chronic, discussed treatment options. Patient prefers to try use of as needed heat, reduction in caffeine intake, and massage.       Relevant Orders   CBC   Ferritin   Iron   Basic metabolic panel   Prediabetes    Check A1C today, Further recommendations may be made based on these results.         Relevant Orders   Hemoglobin G6Y   Basic metabolic panel    Return in about 6 months (around 02/24/2023) for f/u  with Huriel Matt.    Ailene Ards, NP

## 2022-08-26 NOTE — Assessment & Plan Note (Signed)
No longer coughing, will refill flonase.

## 2022-08-26 NOTE — Assessment & Plan Note (Signed)
Check A1C today, Further recommendations may be made based on these results.

## 2022-08-26 NOTE — Assessment & Plan Note (Signed)
Chronic, discussed treatment options. Patient prefers to try use of as needed heat, reduction in caffeine intake, and massage.

## 2022-09-05 ENCOUNTER — Other Ambulatory Visit: Payer: Self-pay | Admitting: Cardiology

## 2022-09-06 ENCOUNTER — Other Ambulatory Visit: Payer: Self-pay | Admitting: Cardiology

## 2022-09-06 DIAGNOSIS — I10 Essential (primary) hypertension: Secondary | ICD-10-CM

## 2022-09-22 NOTE — Progress Notes (Signed)
Cardiology Office Note   Date:  09/23/2022   ID:  Carmen Brown 1941/03/23, MRN [id number]  PCP:  Carmen Ards, NP  Cardiologist:   Minus Breeding, MD   Chief Complaint  Patient presents with   Palpitations      History of Present Illness: Carmen Brown is a 82 y.o. female who presents for follow up of CAD.  She was admitted with [place] in Jan 2019.  She was found to have 95% LAD stenosis treated stent.  EF was normal.    When I last saw her she in 2019 she had SOB and I ordered a POET (Plain Old Exercise Treadmill) which was negative for ischemia in August 2021.    Since I last saw her she was in the ED with chest pain in late December.  I reviewed these records for this visit.   There was no evidence of ischemia.  She called the EMS the other day because she had some episodes of electrical shocklike feeling in her chest.  EMS noted her heart rate to be low.  I did see some rhythm strips that demonstrated some bradycardia with possible blocked PACs versus just brief vagal episode.  None of that was sustained.  She did not have any presyncope or syncope.  In the emergency room she had no evidence of ischemia.  She is otherwise had no further symptoms.  Since her 2021 stress test she has been doing well.  She has not been having any chest pressure, neck or arm discomfort.  She has not been having any PND or orthopnea.  She has no new shortness of breath   Past Medical History:  Diagnosis Date   Allergy    Arthritis    CAD (coronary artery disease)    95% proximal LAD stenosis with DES.  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[place])    Thyroid disease     Past Surgical History:  Procedure Laterality Date   CARPAL TUNNEL RELEASE Right 11/2016   CATARACT EXTRACTION  2020   Per patient    LEFT HEART CATH AND CORONARY ANGIOGRAPHY N/A 09/08/2017   Procedure: LEFT HEART CATH AND CORONARY ANGIOGRAPHY;  Surgeon: Nelva Bush, MD;  Location: [facility];  Service: Cardiovascular;  Laterality: N/A;   MASS EXCISION  03/15/2012   Procedure: MINOR EXCISION OF MASS;  Surgeon: Cammie Sickle., MD;  Location: [facility];  Service: Orthopedics;  Laterality: Right;  debride IP right long finger, excision mucoid cyst   ventricular tacticartia       Current Outpatient Medications  Medication Sig Dispense Refill   allopurinol (ZYLOPRIM) 300 MG tablet TAKE 1 TABLET(300 MG) BY MOUTH DAILY 90 tablet 1   amLODipine (NORVASC) 2.5 MG tablet TAKE 1 TABLET(2.5 MG) BY MOUTH TWICE DAILY 180 tablet 0   aspirin EC 81 MG tablet Take 1 tablet (81 mg total) by mouth daily. 90 tablet 3   atorvastatin (LIPITOR) 40 MG tablet Take 1 tablet (40 mg total) by mouth daily. 90 tablet 3   Biotin 5000 MCG CAPS Take 1 capsule by mouth daily. Takes daily     fluticasone (FLONASE) 50 MCG/ACT nasal spray Place 2 sprays into  both nostrils daily. 16 g 0   Glucosamine HCl (GLUCOSAMINE PO) Take by mouth.     lisinopril (ZESTRIL) 20 MG tablet TAKE 1 TABLET(20 MG) BY MOUTH TWICE DAILY 60 tablet 3   loratadine (CLARITIN) 10 MG tablet Take 10 mg by mouth daily.     metoprolol succinate (TOPROL-XL) 25 MG 24 hr tablet TAKE 1 TABLET(25 MG) BY MOUTH DAILY 90 tablet 3   Multiple Vitamin (MULTIVITAMIN) tablet Take 1 tablet by mouth daily.     nitroGLYCERIN (NITROSTAT) 0.4 MG SL tablet Place 1 tablet (0.4 mg total) under the tongue every 5 (five) minutes x 3 doses as needed for chest pain. 25 tablet 2   OMEGA MONOPURE CURCUMIN EC 125-600 MG CPDR      Zoster Vaccine Adjuvanted [facility]) injection      No current facility-administered medications for this visit.    Allergies:   Epinephrine and Latex    ROS:  Please see the history of present illness.   Otherwise, review of systems are positive for none.    All other systems are reviewed and negative.    PHYSICAL EXAM: VS:  BP 132/78   Pulse 80   Ht 5' 4.5" (1.638 m)   Wt 197 lb (89.4 kg)   SpO2 99%   BMI 33.29 kg/m  , BMI Body mass index is 33.29 kg/m.  GENERAL:  Well appearing NECK:  No jugular venous distention, waveform within normal limits, carotid upstroke brisk and symmetric, no bruits, no thyromegaly LUNGS:  Clear to auscultation bilaterally CHEST:  Unremarkable HEART:  PMI not displaced or sustained,S1 and S2 within normal limits, no S3, no S4, no clicks, no rubs, no murmurs ABD:  Flat, positive bowel sounds normal in frequency in pitch, no bruits, no rebound, no guarding, no midline pulsatile mass, no hepatomegaly, no splenomegaly EXT:  2 plus pulses throughout, no edema, no cyanosis no clubbing  EKG:  EKG is ordered today. Sinus rhythm, rate 80, axis within normal limits, intervals within normal limits, no acute ST-T wave changes.  Recent Labs: 12/31/2021: TSH 5.04 08/06/2022: ALT 23 08/26/2022: BUN 17; Creatinine, Ser 0.84; Hemoglobin 14.9; Platelets 243.0; Potassium 4.9; Sodium 139    Lipid Panel    Component Value Date/Time   CHOL 101 12/31/2021 1438   CHOL 114 08/29/2019 1023   TRIG 240.0 (H) 12/31/2021 1438   HDL 35.20 (L) 12/31/2021 1438   HDL 46 08/29/2019 1023   CHOLHDL 3 12/31/2021 1438   VLDL 48.0 (H) 12/31/2021 1438   LDLCALC 46 08/29/2019 1023   LDLDIRECT 49.0 12/31/2021 1438      Wt Readings from Last 3 Encounters:  09/23/22 197 lb (89.4 kg)  08/26/22 196 lb 4 oz (89 kg)  08/06/22 192 lb (87.1 kg)      Other studies Reviewed: Additional studies/ records that were reviewed today include:  ED and EMS records Review of the above records demonstrates: See elsewhere  ASSESSMENT AND PLAN:   CAD:   The patient has no new sypmtoms.  No further cardiovascular testing is indicated.  We will continue with aggressive risk reduction and meds as listed.the symptoms she had recently were  nonanginal.  BRADYCARDIA: She had a brief episode of bradycardia that I was able to review.  However, she has no symptoms related to this.  This could have been vagal.  There might have been some blocked PACs.  No further workup is suggested.    HTN:  The blood pressure is at target.  No change in  therapy.    Current medicines are reviewed at length with the patient today.  The patient does not have concerns regarding medicines.  The following changes have been made:  None  Labs/ tests ordered today include: None  Orders Placed This Encounter  Procedures   EKG 12-Lead     Disposition:   FU with me in 12 months.    Signed, Minus Breeding, MD  09/23/2022 10:11 AM    [facility]

## 2022-09-23 ENCOUNTER — Ambulatory Visit: Payer: BC Managed Care – PPO | Attending: Cardiology | Admitting: Cardiology

## 2022-09-23 ENCOUNTER — Encounter: Payer: Self-pay | Admitting: Cardiology

## 2022-09-23 VITALS — BP 132/78 | HR 80 | Ht 64.5 in | Wt 197.0 lb

## 2022-09-23 DIAGNOSIS — I2510 Atherosclerotic heart disease of native coronary artery without angina pectoris: Secondary | ICD-10-CM

## 2022-09-23 DIAGNOSIS — I10 Essential (primary) hypertension: Secondary | ICD-10-CM

## 2022-09-23 DIAGNOSIS — E785 Hyperlipidemia, unspecified: Secondary | ICD-10-CM

## 2022-09-23 NOTE — Patient Instructions (Signed)
Medication Instructions:  Your physician recommends that you continue on your current medications as directed. Please refer to the Current Medication list given to you today.  *If you need a refill on your cardiac medications before your next appointment, please call your pharmacy*   Lab Work: None   Testing/Procedures: None   Follow-Up: At [facility], you and your health needs are our priority.  As part of our continuing mission to provide you with exceptional heart care, we have created designated Provider Care Teams.  These Care Teams include your primary Cardiologist (physician) and Advanced Practice Providers (APPs -  Physician Assistants and Nurse Practitioners) who all work together to provide you with the care you need, when you need it.  Your next appointment:   1 year(s)  Provider:   Minus Breeding, MD

## 2022-10-01 ENCOUNTER — Other Ambulatory Visit: Payer: Self-pay | Admitting: Nurse Practitioner

## 2022-10-01 DIAGNOSIS — R053 Chronic cough: Secondary | ICD-10-CM

## 2022-10-10 ENCOUNTER — Other Ambulatory Visit: Payer: Self-pay

## 2022-10-10 DIAGNOSIS — R053 Chronic cough: Secondary | ICD-10-CM

## 2022-10-10 MED ORDER — FLUTICASONE PROPIONATE 50 MCG/ACT NA SUSP: 2.0000 | Freq: Every day | NASAL | 0 refills | Status: DC

## 2022-11-10 ENCOUNTER — Other Ambulatory Visit: Payer: Self-pay | Admitting: Cardiology

## 2022-11-10 DIAGNOSIS — I2510 Atherosclerotic heart disease of native coronary artery without angina pectoris: Secondary | ICD-10-CM

## 2022-11-10 DIAGNOSIS — I10 Essential (primary) hypertension: Secondary | ICD-10-CM

## 2022-11-10 DIAGNOSIS — E785 Hyperlipidemia, unspecified: Secondary | ICD-10-CM

## 2022-11-10 DIAGNOSIS — R0602 Shortness of breath: Secondary | ICD-10-CM

## 2022-11-19 LAB — LIPID PANEL
Chol/HDL Ratio: 3.2 ratio (ref 0.0–4.4)
Cholesterol, Total: 119 mg/dL (ref 100–199)
HDL: 37 mg/dL — ABNORMAL LOW (ref >39)
LDL Chol Calc (NIH): 53 mg/dL (ref 0–99)
Triglycerides: 172 mg/dL — ABNORMAL HIGH (ref 0–149)
VLDL Cholesterol Cal: 29 mg/dL (ref 5–40)

## 2022-12-11 ENCOUNTER — Other Ambulatory Visit: Payer: Self-pay | Admitting: Cardiology

## 2023-01-27 ENCOUNTER — Encounter: Payer: Self-pay | Admitting: Internal Medicine

## 2023-01-27 ENCOUNTER — Ambulatory Visit: Payer: BC Managed Care – PPO | Attending: Internal Medicine | Admitting: Internal Medicine

## 2023-01-27 VITALS — BP 133/88 | HR 61 | Resp 14 | Ht 64.5 in | Wt 196.0 lb

## 2023-01-27 DIAGNOSIS — M1A09X0 Idiopathic chronic gout, multiple sites, without tophus (tophi): Secondary | ICD-10-CM | POA: Diagnosis not present

## 2023-01-27 NOTE — Progress Notes (Signed)
Office Visit Note  Patient: GLENEVA LEGROS             Date of Birth: 22-Jan-1941           MRN: [id number]             PCP: Elenore Paddy, NP Referring: Elenore Paddy, NP Visit Date: 01/27/2023   Subjective:  Follow-up (Patient states she is not sure if the glands around her jaw are swollen. )   History of Present Illness: HENRYETTA GUASTELLA is a 82 y.o. female here for follow up for gout on allopurinol 300 mg daily.  Doing well at this had prednisone prescription called in for use as needed has not required additional rounds for flareup.  Continues follow-up with orthopedic surgery for osteoarthritis of the knees.   Previous HPI 07/22/22 KAMIYA GREANY is a 82 y.o. female here for follow up for gout on allopurinol 300 mg daily. She has been doing well without  flare ups most of this year just having issue at the right foot. 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 She sees orthopedics clinic for bilateral knee injections every 6 months with good benefit.   Previous HPI 07/23/21 MARIADELROSARI JENSEN is a 82 y.o. female here for follow up for gout on allopurinol 300 mg daily. Overall doing well she had COVID this summer and stopped taking colchicine with concern about medications and side effects at that time. She started taking tart cherry supplement regularly. She has only had one flare in the right foot for which she took steroids which resolved.   Previous HPI 01/13/21 KHALIE PETRONELLA is a 82 y.o. female here for follow up for gout currently on allopurinol 300 mg p.o. daily and colchicine 0.6 mg daily.  Symptoms are doing pretty well she has not experienced any significant flare or attacks since the last visit.  She has noticed some increased weight gain attributes this to a lot of difficulty maintaining a good or balanced diet while constantly traveling.  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   Review of Systems  Constitutional:  Positive for fatigue.  HENT:  Negative for mouth sores and mouth dryness.   Eyes:  Negative for dryness.  Respiratory:  Negative for shortness of breath.   Cardiovascular:  Negative for chest pain and palpitations.  Gastrointestinal:  Negative for blood in stool, constipation and diarrhea.  Endocrine: Negative for increased urination.  Genitourinary:  Negative for involuntary urination.  Musculoskeletal:  Positive for joint pain, joint pain, joint swelling, myalgias, morning stiffness and myalgias. Negative for gait problem, muscle weakness and muscle tenderness.  Skin:  Negative for color change, rash, hair loss and sensitivity to sunlight.  Allergic/Immunologic: Negative for susceptible to infections.  Neurological:  Negative for dizziness and headaches.  Hematological:  Negative for swollen glands.  Psychiatric/Behavioral:  Positive for sleep disturbance. Negative for depressed mood. The patient is not nervous/anxious.     PMFS History:  Patient Active Problem List   Diagnosis Date Noted   Restless leg syndrome 08/26/2022   Prediabetes 08/26/2022   Need for vaccination 05/20/2022   Class 2 severe obesity with serious comorbidity and body mass index (BMI) of 35.0 to 35.9 in adult [facility]) 05/20/2022  Snoring 05/20/2022   Gastroenteritis 02/11/2022   Stage 2 chronic kidney disease 02/11/2022   Obesity (BMI 30-39.9) 02/11/2022   Chronic cough 12/31/2021   Dysuria 12/31/2021   Hyperglycemia 12/31/2021   Abnormal weight gain 01/22/2021   Colon cancer screening 01/22/2021   Pain of left hand 10/12/2020   Idiopathic gout of multiple sites 05/21/2020   Generalized osteoarthritis 05/21/2020   SOB (shortness of breath) 11/25/2019   Fatigue 11/25/2019   Educated about COVID-19 virus infection  08/28/2019   Dyslipidemia 08/20/2018   Medication management 11/20/2017   Hyperlipidemia 11/20/2017   CAD (coronary artery disease) 09/09/2017   Unstable angina (HCC) 09/07/2017   Osteoarthritis of left knee 06/08/2016   Osteoarthritis of right knee 06/08/2016   Bilateral carpal tunnel syndrome 06/08/2016   Degenerative disc disease, lumbar 04/06/2015   Chest pressure 11/05/2012   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  Jan 2019   Cardiomegaly    Cardiomyopathy (HCC)    EF 35% in the distant past but NL EF 2009   Hypertension, benign    systemic   Menopausal syndrome    Obesity    Osteoarthritis of lower leg, localized    Paroxysmal VT ([facility])    Thyroid disease     Family History  Problem Relation Age of Onset   Heart disease Mother 40       MI   Heart disease Father 14       CABG   CAD Sister 24       Stent   Lymphoma Sister    Stroke Maternal Grandmother    Diabetes Maternal Grandfather    Diabetes Maternal Uncle    Past Surgical History:  Procedure Laterality Date   CARPAL TUNNEL RELEASE Right 11/2016   CATARACT EXTRACTION  2020   Per patient   LEFT HEART CATH AND CORONARY ANGIOGRAPHY N/A 09/08/2017   Procedure: LEFT HEART CATH AND CORONARY ANGIOGRAPHY;  Surgeon: Yvonne Kendall, MD;  [facility]: [facility];  Service: Cardiovascular;  Laterality: N/A;   MASS EXCISION  03/15/2012   Procedure: MINOR EXCISION OF MASS;  Surgeon: Wyn Forster., MD;  Location:  [facility];  Service: Orthopedics;  Laterality: Right;  debride IP right long finger, excision mucoid  cyst   ventricular tacticartia     Social History   Social History Narrative   Financial controller for [employer].  Education: 40yrs.  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 Caffeine 2 cups decaf. (not every day).   Immunization History  Administered Date(s) Administered   Fluad Quad(high Dose 65+) 07/16/2020, 07/23/2021, 05/20/2022   Influenza Split 08/28/2011   Influenza, High Dose Seasonal PF 07/19/2018   Influenza,inj,Quad PF,6+ Mos 07/22/2014   Influenza-Unspecified 05/29/2016, 05/15/2017   Moderna Sars-Covid-2 Vaccination 09/13/2019, 10/09/2019, 05/15/2020   Pneumococcal Polysaccharide-23 07/28/2009   Td 07/15/2000, 02/21/2012   Tdap 02/21/2012   Zoster Recombinat (Shingrix) 08/20/2018, 01/31/2019   Zoster, Live 05/16/2011     Objective: Vital Signs: BP 133/88 (BP Location: Left Arm, Patient Position: Sitting, Cuff Size: Normal)   Pulse 61   Resp 14   Ht 5' 4.5" (1.638 m)  Wt 196 lb (88.9 kg)   BMI 33.12 kg/m    Physical Exam Cardiovascular:     Rate and Rhythm: Normal rate and regular rhythm.  Pulmonary:     Effort: Pulmonary effort is normal.     Breath sounds: Normal breath sounds.  Musculoskeletal:     Right lower leg: No edema.     Left lower leg: No edema.  Lymphadenopathy:     Cervical: No cervical adenopathy.  Skin:    General: Skin is warm and dry.     Findings: No rash.  Neurological:     Mental Status: She is alert.  Psychiatric:        Mood and Affect: Mood normal.      Musculoskeletal Exam:  Shoulders full ROM no tenderness or swelling Elbows full ROM no tenderness or swelling Wrists full ROM no tenderness or swelling Fingers full ROM with bilateral bouchard and heberdon's nodes Knees full ROM no tenderness or swelling, patellofemoral crepitus bilaterally Ankles full ROM no tenderness or swelling  Investigation: No additional findings.  Imaging: No results found.  Recent Labs: Lab Results  Component Value Date   WBC 6.8 08/26/2022   HGB 14.9 08/26/2022    PLT 243.0 08/26/2022   NA 139 08/26/2022   K 4.9 08/26/2022   CL 102 08/26/2022   CO2 28 08/26/2022   GLUCOSE 87 08/26/2022   BUN 17 08/26/2022   CREATININE 0.84 08/26/2022   BILITOT 0.4 08/06/2022   ALKPHOS 84 08/06/2022   AST 23 08/06/2022   ALT 23 08/06/2022   PROT 6.5 08/06/2022   ALBUMIN 3.6 08/06/2022   CALCIUM 9.8 08/26/2022   GFRAA 74 01/13/2021    Speciality Comments: No specialty comments available.  Procedures:  No procedures performed Allergies: Epinephrine and Latex   Assessment / Plan:     Visit Diagnoses: Idiopathic chronic gout of multiple sites without tophus - Plan: Uric acid  Patient very well-controlled over the past 6 months has not had any severe flares and not much minor inflammation.  Most of her remaining joint pain is related to generalized osteoarthritis especially at knees.  Will recheck uric acid level today if this is very well-controlled could try decreasing allopurinol to 150 mg daily or if at about 6 would maintain 300 mg once daily.  Orders: Orders Placed This Encounter  Procedures   Uric acid   No orders of the defined types were placed in this encounter.    Follow-Up Instructions: Return in about 6 months (around 07/29/2023) for Gout on allopurinol f/u 6mos.   Fuller Plan, MD  Note - This record has been created using [employer].  Chart creation errors have been sought, but may not always  have been located. Such creation errors do not reflect on  the standard of medical care.

## 2023-01-28 LAB — URIC ACID: Uric Acid, Serum: 4.6 mg/dL (ref 2.5–7.0)

## 2023-02-08 ENCOUNTER — Other Ambulatory Visit: Payer: Self-pay | Admitting: Internal Medicine

## 2023-02-08 DIAGNOSIS — M1A09X0 Idiopathic chronic gout, multiple sites, without tophus (tophi): Secondary | ICD-10-CM

## 2023-02-08 NOTE — Progress Notes (Signed)
Uric acid of 4.6 no change in allopurinol dose needed. Allopurinol refill sent today.

## 2023-02-08 NOTE — Telephone Encounter (Signed)
Last Fill: 07/22/2022  Labs: 08/26/2022 BMP and CBC 01/27/2023 Uric Acid 4.6  Next Visit: no follow up listed  Last Visit: 01/27/2023  DX: not mentioned  Current Dose per office note 01/27/2023: not mentioned  Okay to refill Allopurinol?

## 2023-02-24 ENCOUNTER — Ambulatory Visit: Payer: BC Managed Care – PPO | Admitting: Nurse Practitioner

## 2023-03-12 ENCOUNTER — Other Ambulatory Visit: Payer: Self-pay | Admitting: Nurse Practitioner

## 2023-03-12 DIAGNOSIS — R053 Chronic cough: Secondary | ICD-10-CM

## 2023-03-15 ENCOUNTER — Encounter (INDEPENDENT_AMBULATORY_CARE_PROVIDER_SITE_OTHER): Payer: Self-pay

## 2023-04-06 ENCOUNTER — Ambulatory Visit: Payer: BC Managed Care – PPO | Admitting: Nurse Practitioner

## 2023-04-06 ENCOUNTER — Encounter: Payer: Self-pay | Admitting: Nurse Practitioner

## 2023-04-06 ENCOUNTER — Ambulatory Visit (INDEPENDENT_AMBULATORY_CARE_PROVIDER_SITE_OTHER): Payer: BC Managed Care – PPO

## 2023-04-06 ENCOUNTER — Ambulatory Visit: Payer: BC Managed Care – PPO | Attending: Nurse Practitioner

## 2023-04-06 VITALS — BP 136/82 | HR 74 | Temp 98.5°F | Wt 206.2 lb

## 2023-04-06 DIAGNOSIS — R5383 Other fatigue: Secondary | ICD-10-CM | POA: Diagnosis not present

## 2023-04-06 DIAGNOSIS — R790 Abnormal level of blood mineral: Secondary | ICD-10-CM

## 2023-04-06 DIAGNOSIS — R7303 Prediabetes: Secondary | ICD-10-CM | POA: Diagnosis not present

## 2023-04-06 DIAGNOSIS — Z6834 Body mass index (BMI) 34.0-34.9, adult: Secondary | ICD-10-CM

## 2023-04-06 DIAGNOSIS — E669 Obesity, unspecified: Secondary | ICD-10-CM

## 2023-04-06 DIAGNOSIS — R002 Palpitations: Secondary | ICD-10-CM | POA: Diagnosis not present

## 2023-04-06 LAB — IRON: Iron: 158 ug/dL — ABNORMAL HIGH (ref 42–145)

## 2023-04-06 LAB — CBC
HCT: 42.5 % (ref 36.0–46.0)
Hemoglobin: 14.6 g/dL (ref 12.0–15.0)
MCHC: 34.3 g/dL (ref 30.0–36.0)
MCV: 96.8 fl (ref 78.0–100.0)
Platelets: 229 10*3/uL (ref 150.0–400.0)
RBC: 4.4 Mil/uL (ref 3.87–5.11)
RDW: 13.6 % (ref 11.5–15.5)
WBC: 6.3 10*3/uL (ref 4.0–10.5)

## 2023-04-06 LAB — BASIC METABOLIC PANEL
BUN: 18 mg/dL (ref 6–23)
CO2: 27 meq/L (ref 19–32)
Calcium: 9.3 mg/dL (ref 8.4–10.5)
Chloride: 104 meq/L (ref 96–112)
Creatinine, Ser: 0.84 mg/dL (ref 0.40–1.20)
GFR: 65.56 mL/min (ref >60.00)
Glucose, Bld: 99 mg/dL (ref 70–99)
Potassium: 4.3 meq/L (ref 3.5–5.1)
Sodium: 140 meq/L (ref 135–145)

## 2023-04-06 LAB — HEMOGLOBIN A1C: Hgb A1c MFr Bld: 5.6 % (ref 4.6–6.5)

## 2023-04-06 NOTE — Assessment & Plan Note (Signed)
Chronic, stable Check cardiac echocardiogram and long-term cardiac monitor. Follow-up with Dr. Antoine Poche as scheduled or sooner if symptoms worsen. Labs also ordered for further evaluation, further recommendations may be made based upon the results.

## 2023-04-06 NOTE — Assessment & Plan Note (Signed)
Labs ordered, further recommendations may be made based upon these results. 

## 2023-04-06 NOTE — Assessment & Plan Note (Signed)
Chronic Patient would prefer to focus on lifestyle.  We discussed diet recommendations.  She was encouraged to let me know if she would like to be referred to healthy weight and wellness center.  She reports her understanding.

## 2023-04-06 NOTE — Progress Notes (Signed)
Established Patient Office Visit  Subjective   Patient ID: Carmen Brown, female    DOB: [birth year]  Age: 82 y.o. MRN: [id number]  Chief Complaint  Patient presents with   Follow-up    6 month f/u-concerned about weight     Obesity: Reports having difficulty with weight loss and has been gaining weight.  Would like to discuss management options. High Iron: Incidental finding on last labs Prediabetes: Last A1c 5.8 Palpitations/fagitue: Reports exertional fatigue and cardiac palpitations.  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  She does have coronary artery disease.  Follows up with Dr. Antoine Poche on a regular basis. Reports she is a never smoker.     Review of Systems  Constitutional:  Positive for malaise/fatigue.  Respiratory:  Negative for shortness of breath and wheezing.   Cardiovascular:  Positive for leg swelling. Negative for chest pain.      Objective:     BP 136/82   Pulse 74   Temp 98.5 F (36.9 C) (Temporal)   Wt 206 lb 3.2 oz (93.5 kg)   SpO2 93%   BMI 34.85 kg/m  BP Readings from Last 3 Encounters:  04/06/23 136/82  01/27/23 133/88  09/23/22 132/78   Wt Readings from Last 3 Encounters:  04/06/23 206 lb 3.2 oz (93.5 kg)  01/27/23 196 lb (88.9 kg)  09/23/22 197 lb (89.4 kg)      Physical Exam Vitals reviewed.  Constitutional:      General: She is not in acute distress.    Appearance: Normal appearance.  HENT:     Head: Normocephalic and atraumatic.  Neck:     Vascular: No carotid bruit.  Cardiovascular:     Rate and Rhythm: Normal rate and regular rhythm.     Pulses: Normal pulses.     Heart sounds: Normal heart sounds.  Pulmonary:     Effort: Pulmonary effort is normal.     Breath sounds: Normal breath sounds.  Skin:    General: Skin is warm and dry.  Neurological:     General: No focal deficit present.     Mental Status: She is alert and oriented to person,  place, and time.  Psychiatric:        Mood and Affect: Mood normal.        Behavior: Behavior normal.        Judgment: Judgment normal.      No results found for any visits on 04/06/23.    The ASCVD Risk score (Arnett DK, et al., 2019) failed to calculate for the following reasons:   The 2019 ASCVD risk score is only valid for ages 86 to 64   The patient has a prior MI or stroke diagnosis    Assessment & Plan:   Problem List Items Addressed This Visit       Other   Obesity    Chronic Patient would prefer to focus on lifestyle.  We discussed diet recommendations.  She was encouraged to let me know if she would like to be referred to healthy weight and wellness center.  She reports her understanding.      Fatigue    Chronic, stable Check cardiac echocardiogram and long-term cardiac monitor. Follow-up with Dr. Antoine Poche as scheduled or sooner if symptoms worsen. Labs also ordered for further evaluation, further recommendations may be made based upon the results.      Relevant Orders  ECHOCARDIOGRAM COMPLETE   DG Chest 2 View   Prediabetes    Labs ordered, further recommendations may be made based upon these results       Relevant Orders   Iron   Ferritin   CBC   Basic metabolic panel   Hemoglobin A1c   Abnormal serum iron level - Primary    Labs ordered, further recommendations may be made based upon these results       Relevant Orders   Iron   Ferritin   CBC   Basic metabolic panel   Hemoglobin A1c   Palpitations    Chronic, stable Check cardiac echocardiogram and long-term cardiac monitor. Follow-up with Dr. Antoine Poche as scheduled or sooner if symptoms worsen. Labs also ordered for further evaluation, further recommendations may be made based upon the results.      Relevant Orders   ECHOCARDIOGRAM COMPLETE   LONG TERM MONITOR (3-14 DAYS)    Return in about 3 months (around 07/07/2023) for F/U with Nareh Matzke.  Total time spent on encounter today is 44  minutes including face-to-face evaluation, review of previous records, and development/discussion of treatment plan.   Elenore Paddy, NP

## 2023-04-06 NOTE — Progress Notes (Unsigned)
Enrolled patient for a 3 day Zio XT monitor to be mailed to patients home  Hochrein to read

## 2023-04-07 ENCOUNTER — Other Ambulatory Visit: Payer: Self-pay | Admitting: Nurse Practitioner

## 2023-04-07 DIAGNOSIS — R790 Abnormal level of blood mineral: Secondary | ICD-10-CM

## 2023-04-07 LAB — FERRITIN: Ferritin: 52.1 ng/mL (ref 10.0–291.0)

## 2023-04-10 DIAGNOSIS — R002 Palpitations: Secondary | ICD-10-CM | POA: Diagnosis not present

## 2023-04-28 ENCOUNTER — Ambulatory Visit (HOSPITAL_COMMUNITY): Payer: BC Managed Care – PPO | Attending: Nurse Practitioner

## 2023-04-28 DIAGNOSIS — I509 Heart failure, unspecified: Secondary | ICD-10-CM | POA: Diagnosis not present

## 2023-04-28 DIAGNOSIS — R5383 Other fatigue: Secondary | ICD-10-CM | POA: Insufficient documentation

## 2023-04-28 DIAGNOSIS — R002 Palpitations: Secondary | ICD-10-CM | POA: Insufficient documentation

## 2023-04-28 LAB — ECHOCARDIOGRAM COMPLETE
AR max vel: 2.29 cm2
AV Area VTI: 2.21 cm2
AV Area mean vel: 2.2 cm2
AV Mean grad: 7.6 mmHg
AV Peak grad: 13.8 mmHg
Ao pk vel: 1.86 m/s
Area-P 1/2: 3.36 cm2
S' Lateral: 2.5 cm

## 2023-05-04 ENCOUNTER — Inpatient Hospital Stay: Payer: BC Managed Care – PPO

## 2023-05-04 ENCOUNTER — Inpatient Hospital Stay: Payer: BC Managed Care – PPO | Attending: Nurse Practitioner | Admitting: Nurse Practitioner

## 2023-05-04 ENCOUNTER — Encounter: Payer: Self-pay | Admitting: Nurse Practitioner

## 2023-05-04 ENCOUNTER — Other Ambulatory Visit: Payer: Self-pay | Admitting: Nurse Practitioner

## 2023-05-04 VITALS — BP 132/82 | HR 88 | Temp 97.9°F | Resp 16 | Ht 63.5 in | Wt 205.4 lb

## 2023-05-04 DIAGNOSIS — N189 Chronic kidney disease, unspecified: Secondary | ICD-10-CM | POA: Diagnosis not present

## 2023-05-04 DIAGNOSIS — Z807 Family history of other malignant neoplasms of lymphoid, hematopoietic and related tissues: Secondary | ICD-10-CM | POA: Diagnosis not present

## 2023-05-04 DIAGNOSIS — Z79899 Other long term (current) drug therapy: Secondary | ICD-10-CM | POA: Diagnosis not present

## 2023-05-04 DIAGNOSIS — R790 Abnormal level of blood mineral: Secondary | ICD-10-CM | POA: Insufficient documentation

## 2023-05-04 DIAGNOSIS — R7303 Prediabetes: Secondary | ICD-10-CM | POA: Diagnosis not present

## 2023-05-04 DIAGNOSIS — M109 Gout, unspecified: Secondary | ICD-10-CM | POA: Insufficient documentation

## 2023-05-04 DIAGNOSIS — Z8041 Family history of malignant neoplasm of ovary: Secondary | ICD-10-CM | POA: Diagnosis not present

## 2023-05-04 DIAGNOSIS — I2510 Atherosclerotic heart disease of native coronary artery without angina pectoris: Secondary | ICD-10-CM | POA: Insufficient documentation

## 2023-05-04 DIAGNOSIS — I129 Hypertensive chronic kidney disease with stage 1 through stage 4 chronic kidney disease, or unspecified chronic kidney disease: Secondary | ICD-10-CM | POA: Insufficient documentation

## 2023-05-04 DIAGNOSIS — I7121 Aneurysm of the ascending aorta, without rupture: Secondary | ICD-10-CM

## 2023-05-04 NOTE — Progress Notes (Addendum)
[facility]   [phone] [phone]   Clinic New consult Note   Patient Care Team: Elenore Paddy, NP as PCP - General (Nurse Practitioner) Rollene Rotunda, MD as PCP - Cardiology (Cardiology) 05/04/2023  CHIEF COMPLAINTS/PURPOSE OF CONSULTATION:  Elevated iron level, referred by PCP Jiles Prows, NP  HISTORY OF PRESENTING ILLNESS:  Carmen Brown 82 y.o. female with PMH including CAD, HTN, HL, gout, degenerative disc disease, osteoarthritis, CKD, and pre-DM is here because of elevated iron level.  She has no history of abnormal CBC.  Iron level 08/26/2022 elevated 189 with normal ferritin 78.  Repeat iron level 04/06/2023 remained elevated at 158, normal ferritin 52.  She was not fasting at the time of labs.  She takes vitamins and supplements including a multivitamin but does not contain iron.  She eats red meat occasionally but not frequently or daily.  Socially, she is single, no children.  She is a Financial controller.  She drinks alcohol occasionally, denies tobacco or other drug use.  Independent with ADLs.  No recent cancer screenings.  Her sister had ovarian cancer and lymphoma.  Today she presents by herself, feeling well in general.  She has some morning stiffness and exertional dyspnea which began after she gained some weight and became less active during COVID times.  Denies history of thrombosis, chest pain, cough.  Denies bleeding, pain, change in bowel habits, or any other new or specific complaints.    MEDICAL HISTORY:  Past Medical History:  Diagnosis Date   Allergy    Arthritis    CAD (coronary artery disease)    95% proximal LAD stenosis with DES.  Jan 2019   Cardiomegaly    Cardiomyopathy (HCC)    EF 35% in the distant past but NL EF 2009   Hypertension, benign    systemic   Menopausal syndrome    Obesity    Osteoarthritis of lower leg, localized    Paroxysmal VT (HCC)    Thyroid disease     SURGICAL HISTORY: Past Surgical  History:  Procedure Laterality Date   CARPAL TUNNEL RELEASE Right 11/2016   CATARACT EXTRACTION  2020   Per patient   LEFT HEART CATH AND CORONARY ANGIOGRAPHY N/A 09/08/2017   Procedure: LEFT HEART CATH AND CORONARY ANGIOGRAPHY;  Surgeon: Yvonne Kendall, MD;  Location: [facility];  Service: Cardiovascular;  Laterality: N/A;   MASS EXCISION  03/15/2012   Procedure: MINOR EXCISION OF MASS;  Surgeon: Wyn Forster., MD;  Location: [facility];  Service: Orthopedics;  Laterality: Right;  debride IP right long finger, excision mucoid cyst   ventricular tacticartia      SOCIAL HISTORY: Social History   Socioeconomic History   Marital status: Single    Spouse name: Not on file   Number of children: Not on file   Years of education: Not on file   Highest education level: Not on file  Occupational History   Not on file  Tobacco Use   Smoking status: Never   Smokeless tobacco: Never  Vaping Use   Vaping status: Never Used  Substance and Sexual Activity   Alcohol use: Yes    Comment: Occasionally   Drug use: No   Sexual activity: Not on file  Other Topics Concern   Not on file  Social History Narrative   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  Education: 36yrs.  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 Caffeine 2 cups decaf. (not every day).   Social Determinants  of Health   Financial Resource Strain: Not on file  Food Insecurity: Not on file  Transportation Needs: Not on file  Physical Activity: Not on file  Stress: Not on file (09/27/2019)  Social Connections: Not on file  Intimate Partner Violence: Low Risk  (11/27/2019)   [facility]   Intimate Partner Violence    Insults You: Not on file    Threatens You: Not on file    Screams at [place]: Not on file    Physically Hurt: Not on file    Intimate Partner Violence Score: Not on file    FAMILY HISTORY: Family History  Problem Relation Age of Onset   Heart disease Mother 84       MI   Heart disease  Father 24       CABG   Cancer Sister        lyphoma and ovarian   CAD Sister 36       Stent   Lymphoma Sister    Diabetes Maternal Uncle    Stroke Maternal Grandmother    Diabetes Maternal Grandfather     ALLERGIES:  is allergic to epinephrine and latex.  MEDICATIONS:  Current Outpatient Medications  Medication Sig Dispense Refill   allopurinol (ZYLOPRIM) 300 MG tablet TAKE 1 TABLET(300 MG) BY MOUTH DAILY 90 tablet 1   amLODipine (NORVASC) 2.5 MG tablet TAKE 1 TABLET(2.5 MG) BY MOUTH TWICE DAILY 180 tablet 3   aspirin EC 81 MG tablet Take 1 tablet (81 mg total) by mouth daily. 90 tablet 3   atorvastatin (LIPITOR) 40 MG tablet Take 1 tablet (40 mg total) by mouth daily. 90 tablet 3   Biotin 5000 MCG CAPS Take 1 capsule by mouth daily. Takes daily     fluticasone (FLONASE) 50 MCG/ACT nasal spray SHAKE LIQUID AND USE 2 SPRAYS IN EACH NOSTRIL DAILY 16 g 3   Glucosamine HCl (GLUCOSAMINE PO) Take by mouth.     lisinopril (ZESTRIL) 20 MG tablet TAKE 1 TABLET(20 MG) BY MOUTH TWICE DAILY 180 tablet 3   loratadine (CLARITIN) 10 MG tablet Take 10 mg by mouth daily.     metoprolol succinate (TOPROL-XL) 25 MG 24 hr tablet TAKE 1 TABLET(25 MG) BY MOUTH DAILY 90 tablet 3   Multiple Vitamin (MULTIVITAMIN) tablet Take 1 tablet by mouth daily.     nitroGLYCERIN (NITROSTAT) 0.4 MG SL tablet Place 1 tablet (0.4 mg total) under the tongue every 5 (five) minutes x 3 doses as needed for chest pain. 25 tablet 2   OMEGA MONOPURE CURCUMIN EC 125-600 MG CPDR      Zoster Vaccine Adjuvanted [facility]) injection      No current facility-administered medications for this visit.    REVIEW OF SYSTEMS:   Constitutional: Denies fevers, chills or abnormal night sweats Eyes: Denies blurriness of vision, double vision or watery eyes Ears, nose, mouth, throat, and face: Denies mucositis or sore throat Respiratory: Denies cough, dyspnea or wheezes (+) DOE Cardiovascular: Denies palpitation, chest discomfort or lower  extremity swelling Gastrointestinal:  Denies nausea, heartburn or change in bowel habits Skin: Denies abnormal skin rashes Lymphatics: Denies new lymphadenopathy or easy bruising Neurological:Denies numbness, tingling or new weaknesses Behavioral/Psych: Mood is stable, no new changes  MSK: (+) Joint stiffness All other systems were reviewed with the patient and are negative.  PHYSICAL EXAMINATION: ECOG PERFORMANCE STATUS: 0 - Asymptomatic  Vitals:   05/04/23 1238 05/04/23 1256  BP: (!) 162/98 132/82  Pulse:    Resp:  Temp:    SpO2:     Filed Weights   05/04/23 1233  Weight: 205 lb 6.4 oz (93.2 kg)    GENERAL:alert, no distress and comfortable SKIN: no rashes or significant lesions EYES: sclera clear NECK: Without mass LYMPH:  no palpable cervical or supraclavicular lymphadenopathy LUNGS: clear to auscultation with normal breathing effort HEART: regular rate & rhythm, no lower extremity edema ABDOMEN:abdomen soft, non-tender and normal bowel sounds Musculoskeletal:no cyanosis of digits and no clubbing  PSYCH: alert & oriented x 3 with fluent speech NEURO: no focal motor/sensory deficits  LABORATORY DATA:  I have reviewed the data as listed    Latest Ref Rng & Units 04/06/2023   10:45 AM 08/26/2022   11:53 AM 08/06/2022    1:30 AM  CBC  WBC 4.0 - 10.5 K/uL 6.3  6.8  7.6   Hemoglobin 12.0 - 15.0 g/dL 43.3  29.5  18.8   Hematocrit 36.0 - 46.0 % 42.5  44.2  39.0   Platelets 150.0 - 400.0 K/uL 229.0  243.0  185        Latest Ref Rng & Units 04/06/2023   10:45 AM 08/26/2022   11:53 AM 08/06/2022    1:30 AM  CMP  Glucose 70 - 99 mg/dL 99  87  416   BUN 6 - 23 mg/dL 18  17  15    Creatinine 0.40 - 1.20 mg/dL 6.06  3.01  6.01   Sodium 135 - 145 mEq/L 140  139  140   Potassium 3.5 - 5.1 mEq/L 4.3  4.9  3.5   Chloride 96 - 112 mEq/L 104  102  108   CO2 19 - 32 mEq/L 27  28  24    Calcium 8.4 - 10.5 mg/dL 9.3  9.8  9.0   Total Protein 6.5 - 8.1 g/dL   6.5   Total  Bilirubin 0.3 - 1.2 mg/dL   0.4   Alkaline Phos 38 - 126 U/L   84   AST 15 - 41 U/L   23   ALT 0 - 44 U/L   23      RADIOGRAPHIC STUDIES: I have personally reviewed the radiological images as listed and agreed with the findings in the report. ECHOCARDIOGRAM COMPLETE  Result Date: 04/28/2023    ECHOCARDIOGRAM REPORT   Patient Name:   Carmen Brown Date of Exam: 04/28/2023 Medical Rec #:  [id number]       Height:       64.5 in Accession #:    [id number]      Weight:       206.2 lb Date of Birth:  1940-10-06       BSA:          1.993 m Patient Age:    82 years        BP:           136/82 mmHg Patient Gender: F               HR:           85 bpm. Exam Location:  [address] Procedure: 2D Echo, Cardiac Doppler and Color Doppler Indications:    R53.83 Fatigue  History:        Patient has prior history of Echocardiogram examinations, most                 recent 11/02/2007. Cardiomyopathy; CAD.  Sonographer:    Daphine Deutscher RDCS Referring Phys: [id number] Elenore Paddy IMPRESSIONS  1. Left ventricular ejection fraction, by estimation, is 65 to 70%. Left ventricular ejection fraction by PLAX is 68 %. The left ventricle has normal function. The left ventricle has no regional wall motion abnormalities. There is mild asymmetric left ventricular hypertrophy of the basal-septal segment. Left ventricular diastolic parameters are consistent with Grade I diastolic dysfunction (impaired relaxation).  2. Right ventricular systolic function is normal. The right ventricular size is normal.  3. The mitral valve is abnormal. Trivial mitral valve regurgitation.  4. The aortic valve is tricuspid. Aortic valve regurgitation is not visualized. Aortic valve sclerosis/calcification is present, without any evidence of aortic stenosis.  5. Aortic dilatation noted. There is mild dilatation of the ascending aorta, measuring 42 mm. Comparison(s): Approximately 2 second sinus pause was noted during the exam (image 36). FINDINGS   Left Ventricle: Left ventricular ejection fraction, by estimation, is 65 to 70%. Left ventricular ejection fraction by PLAX is 68 %. The left ventricle has normal function. The left ventricle has no regional wall motion abnormalities. The left ventricular internal cavity size was normal in size. There is mild asymmetric left ventricular hypertrophy of the basal-septal segment. Left ventricular diastolic parameters are consistent with Grade I diastolic dysfunction (impaired relaxation). Indeterminate filling pressures. Right Ventricle: The right ventricular size is normal. No increase in right ventricular wall thickness. Right ventricular systolic function is normal. Left Atrium: Left atrial size was normal in size. Right Atrium: Right atrial size was normal in size. Pericardium: There is no evidence of pericardial effusion. Mitral Valve: The mitral valve is abnormal. Mild mitral annular calcification. Trivial mitral valve regurgitation. Tricuspid Valve: The tricuspid valve is grossly normal. Tricuspid valve regurgitation is trivial. Aortic Valve: The aortic valve is tricuspid. Aortic valve regurgitation is not visualized. Aortic valve sclerosis/calcification is present, without any evidence of aortic stenosis. Pulmonic Valve: The pulmonic valve was normal in structure. Pulmonic valve regurgitation is not visualized. Aorta: Aortic dilatation noted. There is mild dilatation of the ascending aorta, measuring 42 mm. IAS/Shunts: No atrial level shunt detected by color flow Doppler.  LEFT VENTRICLE PLAX 2D LV EF:         Left            Diastology                ventricular     LV e' medial:    5.38 cm/s                ejection        LV E/e' medial:  14.8                fraction by     LV e' lateral:   5.38 cm/s                PLAX is 68      LV E/e' lateral: 14.8                %. LVIDd:         4.00 cm LVIDs:         2.50 cm LV PW:         0.90 cm LV IVS:        1.00 cm LVOT diam:     2.10 cm LV SV:         81 LV SV  Index:   40 LVOT Area:     3.46 cm  RIGHT VENTRICLE  IVC RV Basal diam:  3.40 cm     IVC diam: 1.20 cm RV S prime:     16.20 cm/s TAPSE (M-mode): 1.9 cm LEFT ATRIUM             Index        RIGHT ATRIUM           Index LA diam:        3.80 cm 1.91 cm/m   RA Area:     11.40 cm LA Vol (A2C):   37.9 ml 19.02 ml/m  RA Volume:   26.60 ml  13.35 ml/m LA Vol (A4C):   36.1 ml 18.12 ml/m LA Biplane Vol: 36.8 ml 18.47 ml/m  AORTIC VALVE AV Area (Vmax):    2.29 cm AV Area (Vmean):   2.20 cm AV Area (VTI):     2.21 cm AV Vmax:           185.80 cm/s AV Vmean:          127.200 cm/s AV VTI:            0.364 m AV Peak Grad:      13.8 mmHg AV Mean Grad:      7.6 mmHg LVOT Vmax:         123.00 cm/s LVOT Vmean:        80.800 cm/s LVOT VTI:          0.232 m LVOT/AV VTI ratio: 0.64  AORTA Ao Root diam: 3.50 cm Ao Asc diam:  4.20 cm MITRAL VALVE MV Area (PHT): 3.36 cm     SHUNTS MV Decel Time: 226 msec     Systemic VTI:  0.23 m MV E velocity: 79.65 cm/s   Systemic Diam: 2.10 cm MV A velocity: 117.00 cm/s MV E/A ratio:  0.68 Zoila Shutter MD Electronically signed by Zoila Shutter MD Signature Date/Time: 04/28/2023/4:52:54 PM    Final    LONG TERM MONITOR (3-14 DAYS)  Result Date: 04/21/2023 The predominant rhythm was normal sinus. There was 1 run of supraventricular tachycardia lasting 6 beats.  There were no sustained arrhythmias. No symptomatic arrhythmias.   DG Chest 2 View  Result Date: 04/12/2023 CLINICAL DATA:  shortness of breath EXAM: CHEST - 2 VIEW COMPARISON:  08/06/2022. FINDINGS: The heart size and mediastinal contours are within normal limits. Both lungs are clear. No pneumothorax or pleural effusion. Aorta calcified. There are thoracic degenerative changes. IMPRESSION: No active cardiopulmonary disease. Electronically Signed   By: Layla Maw M.D.   On: 04/12/2023 14:29    ASSESSMENT & PLAN: 82 yo female   Elevated serum iron -We reviewed her medical record in detail with the patient.  She was found to have elevated serum iron 189 in 08/2022, and improved but still elevated 04/06/23 to 158 -Normal CBC and ferritin -She does not eat overly high red meat diet, and takes multivitamins but she confirmed none contain iron  -She was non-fasting at the time of previous labs. We recommend repeat iron testing while fasting.  -Given normal H/H and ferritin, the concern for hemochromatosis is low -If repeat Iron is normal, she does not need to f/up here, can f/up with PCP  Health Maintenance  -Per PCP    PLAN: -Medical record reviewed -Fasting iron panel 9/20, f/up pending results -If normal, f/up with PCP -Pt seen with Dr. Mosetta Putt   Orders Placed This Encounter  Procedures   Iron and Iron Binding Capacity ([facility] only)    Standing Status:   Standing  Number of Occurrences:   1    Standing Expiration Date:   05/03/2024   Ferritin    Standing Status:   Standing    Number of Occurrences:   1    Standing Expiration Date:   05/03/2024     All questions were answered. The patient knows to call the clinic with any problems, questions or concerns.      Pollyann Samples, NP 05/04/23   Addendum I have seen the patient, examined her. I agree with the assessment and and plan and have edited the notes.   Pt was referred for elevated serum iron level, but her ferritin has been normal.  No clinical concern for hemochromatosis.  Will recommend repeating serum iron, TIBC and ferritin with fasting. If still high, will check hemochromatosis genetics.  Will see her as needed.  Malachy Mood MD 05/04/2023

## 2023-05-05 ENCOUNTER — Other Ambulatory Visit: Payer: Self-pay | Admitting: Nurse Practitioner

## 2023-05-05 ENCOUNTER — Inpatient Hospital Stay: Payer: BC Managed Care – PPO

## 2023-05-05 DIAGNOSIS — R790 Abnormal level of blood mineral: Secondary | ICD-10-CM

## 2023-05-05 DIAGNOSIS — I7121 Aneurysm of the ascending aorta, without rupture: Secondary | ICD-10-CM

## 2023-05-05 LAB — IRON AND IRON BINDING CAPACITY (CC-WL,HP ONLY)
Iron: 87 ug/dL (ref 28–170)
Saturation Ratios: 22 % (ref 10.4–31.8)
TIBC: 395 ug/dL (ref 250–450)
UIBC: 308 ug/dL (ref 148–442)

## 2023-05-05 LAB — FERRITIN: Ferritin: 51 ng/mL (ref 11–307)

## 2023-05-05 NOTE — Progress Notes (Signed)
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 I have ordered the CT scan, scheduling should call her within a week or so to schedule this.

## 2023-05-08 ENCOUNTER — Encounter: Payer: Self-pay | Admitting: Nurse Practitioner

## 2023-05-08 ENCOUNTER — Telehealth: Payer: Self-pay

## 2023-05-08 NOTE — Telephone Encounter (Signed)
I called and notified the pt with of message below on her VM. I stated on the VM if she has any questions to give the office a call.   Carmen Brown M,CMA

## 2023-05-08 NOTE — Telephone Encounter (Signed)
-----   Message from [facility] Ellin Mayhew sent at 05/08/2023 12:46 PM EDT -----  ----- Message ----- From: Pollyann Samples, NP Sent: 05/08/2023   8:25 AM EDT To: Verlee Rossetti, CMA; Tollie Eth, NP; #  Jonny Ruiz, please call Ms. Fetter. Her fasting iron panel is all normal, no concern for iron overload. No further work up or hem/onc f/up needed.   Sanda Linger :)  Thanks, Clayborn Heron NP

## 2023-05-11 ENCOUNTER — Telehealth: Payer: Self-pay | Admitting: Internal Medicine

## 2023-05-11 NOTE — Telephone Encounter (Signed)
Pt called asking about her FMLA. Pt states they did not approve it due to it not being worded right. Pt would like it re written they exact way as it was last time.

## 2023-05-11 NOTE — Telephone Encounter (Signed)
Patient advised her paperwork was completed the exact same way as it was previously. Patient states she received something from her [facility] company stating that the information provided was not sufficient. Patient states she is going to call the company to see if can get more information from them. Patient states she will bring the information by the office so we can see how to correct her form.

## 2023-05-12 ENCOUNTER — Telehealth: Payer: Self-pay | Admitting: *Deleted

## 2023-05-12 NOTE — Telephone Encounter (Signed)
Patient called the office and left message requesting a call back regarding her FMLA paperwork.   Attempted to contact the patient and left message for patient to call the office.

## 2023-05-12 NOTE — Telephone Encounter (Signed)
Patient contacted the office and patient states page two question one was the problem. Patient states whatever is changed needs to be dated and initialed.

## 2023-05-15 NOTE — Telephone Encounter (Signed)
Changed one of the questions on the paperwork and refaxed. Contacted patient and advised the paperwork has be refaxed and to contact us if she has any other problems. Patient verbalized understanding.

## 2023-05-23 ENCOUNTER — Ambulatory Visit
Admission: RE | Admit: 2023-05-23 | Discharge: 2023-05-23 | Disposition: A | Discharge status: Home / Self Care | Payer: BC Managed Care – PPO | Source: Ambulatory Visit | Attending: Nurse Practitioner | Admitting: Nurse Practitioner

## 2023-05-23 DIAGNOSIS — I7121 Aneurysm of the ascending aorta, without rupture: Secondary | ICD-10-CM

## 2023-05-23 MED ORDER — IOPAMIDOL (ISOVUE-370) INJECTION 76%
75.0000 mL | Freq: Once | INTRAVENOUS | Status: AC | PRN
  Administered 2023-05-23: 75 mL via INTRAVENOUS

## 2023-06-23 ENCOUNTER — Ambulatory Visit: Payer: BC Managed Care – PPO | Admitting: Nurse Practitioner

## 2023-06-23 ENCOUNTER — Ambulatory Visit (INDEPENDENT_AMBULATORY_CARE_PROVIDER_SITE_OTHER): Payer: BC Managed Care – PPO

## 2023-06-23 VITALS — BP 138/102 | HR 101 | Temp 98.2°F | Ht 63.5 in | Wt 203.2 lb

## 2023-06-23 DIAGNOSIS — M25512 Pain in left shoulder: Secondary | ICD-10-CM | POA: Diagnosis not present

## 2023-06-23 MED ORDER — LIDOCAINE 4 % EX PTCH: 1.0000 | MEDICATED_PATCH | CUTANEOUS | 1 refills | Status: DC

## 2023-06-23 NOTE — Assessment & Plan Note (Signed)
Acute Sounds musculoskeletal in origin. Recommend rest, use of as needed ice/heat, lidocaine patch, and tylenol.  Will obtain xray today and refer to sports medicine for assistance with evaluation and treatment per patient request.

## 2023-06-23 NOTE — Progress Notes (Signed)
Established Patient Office Visit  Subjective   Patient ID: Carmen Brown, female    DOB: [birth year]  Age: 82 y.o. MRN: [id number]  Chief Complaint  Patient presents with   Pain    Left shoulder pain, started Saturday. Been taking tylenol and hydrocodone for it     Patient arrives today for acute visit.  She reports that about 4 days ago she started experiencing 5/10 constant pain to her left shoulder.  She is a flight attendant, and that over the last few days at home she has been resting her shoulder as well as cycling between treatment of Tylenol and leftover hydrocodone from a previous pain management treatment.  This has resulted in improvement in her pain but she wanted evaluation because she is concerned the pain will come back.  She denies any chest pain, shortness of breath.  Feels that the pain gets worse as the day goes on not necessarily triggered by movement.  Denies any weakness to her left arm or grip also denies any sensory changes.   Also mentions she had covid last month, but has fully recovered and had mild symptoms.      ROS: see HPI    Objective:     BP (!) 138/102   Pulse (!) 101   Temp 98.2 F (36.8 C) (Temporal)   Ht 5' 3.5" (1.613 m)   Wt 203 lb 4 oz (92.2 kg)   SpO2 96%   BMI 35.44 kg/m    Physical Exam Vitals reviewed.  Constitutional:      General: She is not in acute distress.    Appearance: Normal appearance.  HENT:     Head: Normocephalic and atraumatic.  Neck:     Vascular: No carotid bruit.  Cardiovascular:     Rate and Rhythm: Normal rate and regular rhythm.     Pulses: Normal pulses.     Heart sounds: Normal heart sounds.  Pulmonary:     Effort: Pulmonary effort is normal.     Breath sounds: Normal breath sounds.  Musculoskeletal:     Left shoulder: No swelling, deformity, tenderness or crepitus. Normal range of motion. Normal strength. Normal pulse.  Skin:    General: Skin is warm and dry.  Neurological:     General: No  focal deficit present.     Mental Status: She is alert and oriented to person, place, and time.  Psychiatric:        Mood and Affect: Mood normal.        Behavior: Behavior normal.        Judgment: Judgment normal.    EKG:  NSR. Compared to last EKG, without significant changes. No evidence of ST segment changes  No results found for any visits on 06/23/23.    The ASCVD Risk score (Arnett DK, et al., 2019) failed to calculate for the following reasons:   The 2019 ASCVD risk score is only valid for ages 6 to 15   The patient has a prior MI or stroke diagnosis    Assessment & Plan:   Problem List Items Addressed This Visit       Other   Acute pain of left shoulder - Primary    Acute Sounds musculoskeletal in origin. Recommend rest, use of as needed ice/heat, lidocaine patch, and tylenol.  Will obtain xray today and refer to sports medicine for assistance with evaluation and treatment per patient request.       Relevant Medications   lidocaine 4 %  Other Relevant Orders   DG Shoulder Left   EKG 12-Lead   Ambulatory referral to Sports Medicine    Return for F/U with Bianca Raneri as scheduled.    Elenore Paddy, NP

## 2023-06-26 NOTE — Progress Notes (Unsigned)
    Carmen Brown D.[facility] [address] [place] [address] Phone: [phone]   Assessment and Plan:     There are no diagnoses linked to this encounter.  ***   Pertinent previous records reviewed include ***    Follow Up: ***     Subjective:   I, Carmen Brown, am serving as a Neurosurgeon for Doctor Richardean Sale  Chief Complaint: left shoulder pain   HPI:   06/27/2023 Patient is a 82 year old female with concerns of left shoulder pain. Patient states  Relevant Historical Information: ***  Additional pertinent review of systems negative.   Current Outpatient Medications:    allopurinol (ZYLOPRIM) 300 MG tablet, TAKE 1 TABLET(300 MG) BY MOUTH DAILY, Disp: 90 tablet, Rfl: 1   amLODipine (NORVASC) 2.5 MG tablet, TAKE 1 TABLET(2.5 MG) BY MOUTH TWICE DAILY, Disp: 180 tablet, Rfl: 3   aspirin EC 81 MG tablet, Take 1 tablet (81 mg total) by mouth daily., Disp: 90 tablet, Rfl: 3   atorvastatin (LIPITOR) 40 MG tablet, Take 1 tablet (40 mg total) by mouth daily., Disp: 90 tablet, Rfl: 3   Biotin 5000 MCG CAPS, Take 1 capsule by mouth daily. Takes daily, Disp: , Rfl:    fluticasone (FLONASE) 50 MCG/ACT nasal spray, SHAKE LIQUID AND USE 2 SPRAYS IN EACH NOSTRIL DAILY, Disp: 16 g, Rfl: 3   Glucosamine HCl (GLUCOSAMINE PO), Take by mouth., Disp: , Rfl:    lidocaine 4 %, Place 1 patch onto the skin daily., Disp: 10 patch, Rfl: 1   lisinopril (ZESTRIL) 20 MG tablet, TAKE 1 TABLET(20 MG) BY MOUTH TWICE DAILY, Disp: 180 tablet, Rfl: 3   loratadine (CLARITIN) 10 MG tablet, Take 10 mg by mouth daily., Disp: , Rfl:    metoprolol succinate (TOPROL-XL) 25 MG 24 hr tablet, TAKE 1 TABLET(25 MG) BY MOUTH DAILY, Disp: 90 tablet, Rfl: 3   Multiple Vitamin (MULTIVITAMIN) tablet, Take 1 tablet by mouth daily., Disp: , Rfl:    nitroGLYCERIN (NITROSTAT) 0.4 MG SL tablet, Place 1 tablet (0.4 mg total) under the tongue every 5 (five) minutes x 3 doses as needed  for chest pain., Disp: 25 tablet, Rfl: 2   OMEGA MONOPURE CURCUMIN EC 125-600 MG CPDR, , Disp: , Rfl:    Zoster Vaccine Adjuvanted (SHINGRIX) injection, , Disp: , Rfl:    Objective:     There were no vitals filed for this visit.    There is no height or weight on file to calculate BMI.    Physical Exam:    ***   Electronically signed by:  Carmen Brown D.[facility] 7:57 AM 06/26/23

## 2023-06-27 ENCOUNTER — Ambulatory Visit: Payer: BC Managed Care – PPO | Admitting: Sports Medicine

## 2023-06-27 VITALS — BP 132/80 | Ht 63.0 in | Wt 204.0 lb

## 2023-06-27 DIAGNOSIS — M25512 Pain in left shoulder: Secondary | ICD-10-CM

## 2023-06-27 DIAGNOSIS — M898X1 Other specified disorders of bone, shoulder: Secondary | ICD-10-CM | POA: Diagnosis not present

## 2023-06-27 MED ORDER — METHOCARBAMOL 500 MG PO TABS: 500.0000 mg | ORAL_TABLET | Freq: Every day | ORAL | 0 refills | Status: DC | PRN

## 2023-06-27 NOTE — Patient Instructions (Signed)
Scapula HEP  Robaxin 500 mg daily as needed for muscle spasm  Recommend not taking with other pain mediations Tylenol [phone] mg 2-3 times a day for pain relief  As needed follow up if no improvement 4-6 weeks

## 2023-07-07 ENCOUNTER — Ambulatory Visit: Payer: BC Managed Care – PPO | Admitting: Nurse Practitioner

## 2023-08-06 ENCOUNTER — Other Ambulatory Visit: Payer: Self-pay | Admitting: Nurse Practitioner

## 2023-08-06 DIAGNOSIS — R053 Chronic cough: Secondary | ICD-10-CM

## 2023-08-11 ENCOUNTER — Ambulatory Visit: Payer: BC Managed Care – PPO | Admitting: Nurse Practitioner

## 2023-08-12 ENCOUNTER — Other Ambulatory Visit: Payer: Self-pay | Admitting: Cardiology

## 2023-08-12 DIAGNOSIS — I10 Essential (primary) hypertension: Secondary | ICD-10-CM

## 2023-08-12 DIAGNOSIS — E785 Hyperlipidemia, unspecified: Secondary | ICD-10-CM

## 2023-08-12 DIAGNOSIS — I2510 Atherosclerotic heart disease of native coronary artery without angina pectoris: Secondary | ICD-10-CM

## 2023-08-12 DIAGNOSIS — R0602 Shortness of breath: Secondary | ICD-10-CM

## 2023-09-05 ENCOUNTER — Other Ambulatory Visit: Payer: Self-pay | Admitting: Internal Medicine

## 2023-09-05 DIAGNOSIS — M1A09X0 Idiopathic chronic gout, multiple sites, without tophus (tophi): Secondary | ICD-10-CM

## 2023-09-05 NOTE — Telephone Encounter (Signed)
 Attempted to contact patient and left message to advise patient to call the office and schedule patient a follow up appointment.

## 2023-09-05 NOTE — Telephone Encounter (Signed)
Last Fill: 02/08/2023  Labs: 04/06/2023 WNL  01/27/2023 Uric Acid 4.6  Next Visit: Due 07/29/2023. Message sent to the front to schedule.   Last Visit: 01/27/2023  DX:  Idiopathic chronic gout of multiple sites without tophus   Current Dose per office note 01/27/2023: Will recheck uric acid level today if this is very well-controlled could try decreasing allopurinol to 150 mg daily or if at about 6 would maintain 300 mg once daily.   Attempted to contact the patient and left a message to call the office back.   Okay to refill Allopurinol?

## 2023-09-05 NOTE — Telephone Encounter (Signed)
Please schedule patient a follow up visit. Patient due 07/29/2023. Thanks!

## 2023-09-14 ENCOUNTER — Ambulatory Visit: Payer: BC Managed Care – PPO | Admitting: Nurse Practitioner

## 2023-09-14 VITALS — BP 144/80 | HR 75 | Temp 98.4°F | Ht 63.0 in | Wt 213.2 lb

## 2023-09-14 DIAGNOSIS — R591 Generalized enlarged lymph nodes: Secondary | ICD-10-CM

## 2023-09-14 DIAGNOSIS — Z6834 Body mass index (BMI) 34.0-34.9, adult: Secondary | ICD-10-CM

## 2023-09-14 DIAGNOSIS — I10 Essential (primary) hypertension: Secondary | ICD-10-CM | POA: Diagnosis not present

## 2023-09-14 DIAGNOSIS — E66811 Obesity, class 1: Secondary | ICD-10-CM

## 2023-09-14 LAB — COMPREHENSIVE METABOLIC PANEL
ALT: 38 U/L — ABNORMAL HIGH (ref 0–35)
AST: 27 U/L (ref 0–37)
Albumin: 4.2 g/dL (ref 3.5–5.2)
Alkaline Phosphatase: 71 U/L (ref 39–117)
BUN: 20 mg/dL (ref 6–23)
CO2: 28 meq/L (ref 19–32)
Calcium: 9.2 mg/dL (ref 8.4–10.5)
Chloride: 104 meq/L (ref 96–112)
Creatinine, Ser: 0.9 mg/dL (ref 0.40–1.20)
GFR: 60.16 mL/min (ref >60.00)
Glucose, Bld: 99 mg/dL (ref 70–99)
Potassium: 4.1 meq/L (ref 3.5–5.1)
Sodium: 140 meq/L (ref 135–145)
Total Bilirubin: 0.6 mg/dL (ref 0.2–1.2)
Total Protein: 7 g/dL (ref 6.0–8.3)

## 2023-09-14 NOTE — Assessment & Plan Note (Signed)
Chronic, stable, slightly above goal today Per shared decision making patient is going to try to focus on weight loss which she feels will help lower blood pressure as opposed to adjusting medications today.  Patient is asymptomatic. Continue amlodipine 2.5 mg daily, lisinopril 20 mg daily, and metoprolol 25 mg daily.  Follow-up in 3 months or sooner as needed.

## 2023-09-14 NOTE — Assessment & Plan Note (Signed)
Incidental finding on imaging Discussed getting CT scan now versus in 3 months as well as possible referral to general surgery to discuss biopsy.  Patient would like to proceed with CT scan in 3 months.  Reminder sent to myself in the system to order this a couple weeks before she is scheduled to see me next so we can have repeat imaging completed.

## 2023-09-14 NOTE — Progress Notes (Signed)
Established Patient Office Visit  Subjective   Patient ID: Carmen Brown, female    DOB: 1941/05/17  Age: 83 y.o. MRN: [id number]  Chief Complaint  Patient presents with   Hypertension    HTN/Obesity: Continues on amlodipine 2.5mg /day, lisinopril 20mg /day, metoprolol 25 mg daily.  She is tolerating medications well.  Reports that she has gained some weight since last office visit, per chart review it appears she has gained about 9 pounds.  She is starting over-the-counter supplement for treatment of her joint pain and an over-the-counter supplement for appetite suppression.  She like to discuss the safety of these medications.  Lymphadenopathy: Identified on CT scan in 05/2023: "IMPRESSION: 1. No evidence for thoracic aortic aneurysm. Ascending aorta is upper limits of normal in size measuring 3.9 cm. 2. Cholelithiasis. 3. Single enlarged aortocaval lymph node of uncertain etiology. Recommend clinical correlation and follow-up. 4. 8 mm right thyroid nodule.  No follow-up imaging recommended. 5. Small hypodensity in the liver is too small to characterize, but likely a cyst. 6. Chronic appearing compression deformities of T6 and T7. 7. Aortic atherosclerosis.".  I had consulted with supervising physician regarding the finding and it was recommended that we consider repeating CT scan in 6 months.  Patient is here to discuss this further today as well.  She denies any unintentional weight loss or swelling.    Review of Systems  Eyes:  Negative for blurred vision.  Respiratory:  Negative for shortness of breath.   Cardiovascular:  Negative for chest pain.  Neurological:  Negative for dizziness and headaches.      Objective:     BP (!) 144/80   Pulse 75   Temp 98.4 F (36.9 C) (Temporal)   Ht 5\' 3"  (1.6 m)   Wt 213 lb 4 oz (96.7 kg)   SpO2 95%   BMI 37.78 kg/m  BP Readings from Last 3 Encounters:  09/14/23 (!) 144/80  06/27/23 132/80  06/23/23 (!) 138/102   Wt  Readings from Last 3 Encounters:  09/14/23 213 lb 4 oz (96.7 kg)  06/27/23 204 lb (92.5 kg)  06/23/23 203 lb 4 oz (92.2 kg)      Physical Exam Vitals reviewed.  Constitutional:      General: She is not in acute distress.    Appearance: Normal appearance.  HENT:     Head: Normocephalic and atraumatic.  Neck:     Vascular: No carotid bruit.  Cardiovascular:     Rate and Rhythm: Normal rate and regular rhythm.     Pulses: Normal pulses.     Heart sounds: Normal heart sounds.  Pulmonary:     Effort: Pulmonary effort is normal.     Breath sounds: Normal breath sounds.  Skin:    General: Skin is warm and dry.  Neurological:     General: No focal deficit present.     Mental Status: She is alert and oriented to person, place, and time.  Psychiatric:        Mood and Affect: Mood normal.        Behavior: Behavior normal.        Judgment: Judgment normal.      No results found for any visits on 09/14/23.    The ASCVD Risk score (Arnett DK, et al., 2019) failed to calculate for the following reasons:   The 2019 ASCVD risk score is only valid for ages 41 to 28   Risk score cannot be calculated because patient has a medical history  suggesting prior/existing ASCVD    Assessment & Plan:   Problem List Items Addressed This Visit       Cardiovascular and Mediastinum   Hypertension   Chronic, stable, slightly above goal today Per shared decision making patient is going to try to focus on weight loss which she feels will help lower blood pressure as opposed to adjusting medications today.  Patient is asymptomatic. Continue amlodipine 2.5 mg daily, lisinopril 20 mg daily, and metoprolol 25 mg daily.  Follow-up in 3 months or sooner as needed.        Immune and Lymphatic   Lymphadenopathy   Incidental finding on imaging Discussed getting CT scan now versus in 3 months as well as possible referral to general surgery to discuss biopsy.  Patient would like to proceed with CT scan  in 3 months.  Reminder sent to myself in the system to order this a couple weeks before she is scheduled to see me next so we can have repeat imaging completed.        Other   Obesity - Primary   Chronic We discussed her supplements.  We also discussed that because they are not monitored by the FDA I cannot recommend their use as far as their safety and efficacy.  We will check a metabolic panel to monitor kidney function and liver enzymes today.  Further recommendations may be made based upon the results. In the meantime patient encouraged to focus on lifestyle modification aimed at weight loss.  She reports understanding.      Relevant Orders   Comprehensive metabolic panel    Return in about 3 months (around 12/13/2023) for F/U with Kohana Amble.    Elenore Paddy, NP

## 2023-09-14 NOTE — Assessment & Plan Note (Signed)
Chronic We discussed her supplements.  We also discussed that because they are not monitored by the FDA I cannot recommend their use as far as their safety and efficacy.  We will check a metabolic panel to monitor kidney function and liver enzymes today.  Further recommendations may be made based upon the results. In the meantime patient encouraged to focus on lifestyle modification aimed at weight loss.  She reports understanding.

## 2023-09-15 ENCOUNTER — Encounter: Payer: Self-pay | Admitting: Nurse Practitioner

## 2023-10-06 ENCOUNTER — Other Ambulatory Visit: Payer: Self-pay | Admitting: Cardiology

## 2023-10-06 DIAGNOSIS — I10 Essential (primary) hypertension: Secondary | ICD-10-CM

## 2023-10-13 ENCOUNTER — Ambulatory Visit: Payer: BC Managed Care – PPO | Admitting: Nurse Practitioner

## 2023-10-13 VITALS — BP 144/90 | HR 76 | Temp 97.6°F | Ht 63.0 in | Wt 220.0 lb

## 2023-10-13 DIAGNOSIS — R7401 Elevation of levels of liver transaminase levels: Secondary | ICD-10-CM | POA: Diagnosis not present

## 2023-10-13 DIAGNOSIS — R591 Generalized enlarged lymph nodes: Secondary | ICD-10-CM | POA: Diagnosis not present

## 2023-10-13 NOTE — Assessment & Plan Note (Signed)
 Incidental finding Etiology unclear, favor likely fatty liver. Per shared decision making patient would like to focus on lifestyle modification and consider rechecking a metabolic panel at next office visit.  I did express that if liver enzyme remains elevated we should move forward with ultrasound of the liver and she is agreeable to this if elevated on next labs.

## 2023-10-13 NOTE — Progress Notes (Signed)
 Established Patient Office Visit  Subjective   Patient ID: Carmen Brown, female    DOB: [birth year]  Age: 83 y.o. MRN: [id number]  Chief Complaint  Patient presents with   Medical Management of Chronic Issues    Follow up, metabolic panel for ALT. Wants to make sure fluctuating ALT levels are normal    ALT elevated on last metabolic panel, patient arrives today to discuss further.  She was starting some supplements last time I saw her and there was a question whether or not the supplements could be contributing.  Per chart review there has been intermittent elevations in liver enzymes, highest being AST of 44.  I do not see where she has had liver ultrasound in the past.  She is not having any abdominal pain, jaundice. Per CT scan from 05/2023 liver did have a very small lesion that appeared consistent with cyst but otherwise no abnormalities noted on CT report r/t to liver.   She also mentioned she had a fall prior to coming to the office today.  She was walking behind her car and her foot got caught between where her driveway starts and garage ends.  She fell forward on her knees, denies hitting her head or loss of consciousness.  She reports little bit of soreness and scrapes on her knees but otherwise feels fine.    ROS: see HPi    Objective:     BP (!) 144/90   Pulse 76   Temp 97.6 F (36.4 C)   Ht 5\' 3"  (1.6 m)   Wt 220 lb (99.8 kg)   SpO2 97%   BMI 38.97 kg/m  BP Readings from Last 3 Encounters:  10/13/23 (!) 144/90  09/14/23 (!) 144/80  06/27/23 132/80   Wt Readings from Last 3 Encounters:  10/13/23 220 lb (99.8 kg)  09/14/23 213 lb 4 oz (96.7 kg)  06/27/23 204 lb (92.5 kg)      Physical Exam Vitals reviewed.  Constitutional:      General: She is not in acute distress.    Appearance: Normal appearance.  HENT:     Head: Normocephalic and atraumatic.  Neck:     Vascular: No carotid bruit.  Cardiovascular:     Rate and Rhythm: Normal rate and regular  rhythm.     Pulses: Normal pulses.     Heart sounds: Normal heart sounds.  Pulmonary:     Effort: Pulmonary effort is normal.     Breath sounds: Normal breath sounds.  Skin:    General: Skin is warm and dry.  Neurological:     General: No focal deficit present.     Mental Status: She is alert and oriented to person, place, and time.  Psychiatric:        Mood and Affect: Mood normal.        Behavior: Behavior normal.        Judgment: Judgment normal.      No results found for any visits on 10/13/23.    The ASCVD Risk score (Arnett DK, et al., 2019) failed to calculate for the following reasons:   The 2019 ASCVD risk score is only valid for ages 43 to 58   Risk score cannot be calculated because patient has a medical history suggesting prior/existing ASCVD    Assessment & Plan:   Problem List Items Addressed This Visit       Immune and Lymphatic   Lymphadenopathy   She would still like to go through with  repeat CT scan around the end of April.  I have sent a reminder to myself in the system to reach out to her at this time to discuss ordering and scheduling the scan.        Other   Elevated ALT measurement - Primary   Incidental finding Etiology unclear, favor likely fatty liver. Per shared decision making patient would like to focus on lifestyle modification and consider rechecking a metabolic panel at next office visit.  I did express that if liver enzyme remains elevated we should move forward with ultrasound of the liver and she is agreeable to this if elevated on next labs.       Return for as scheduled.    Elenore Paddy, NP

## 2023-10-13 NOTE — Assessment & Plan Note (Signed)
 She would still like to go through with repeat CT scan around the end of April.  I have sent a reminder to myself in the system to reach out to her at this time to discuss ordering and scheduling the scan.

## 2023-10-20 ENCOUNTER — Ambulatory Visit: Payer: BC Managed Care – PPO | Admitting: Cardiology

## 2023-10-20 ENCOUNTER — Other Ambulatory Visit: Payer: Self-pay | Admitting: Nurse Practitioner

## 2023-10-20 DIAGNOSIS — R591 Generalized enlarged lymph nodes: Secondary | ICD-10-CM

## 2023-10-20 NOTE — Progress Notes (Deleted)
 Office Visit Note  Patient: Carmen Brown             Date of Birth: May 03, 1941           MRN: [id number]             PCP: Elenore Paddy, NP Referring: Elenore Paddy, NP Visit Date: 11/03/2023   Subjective:  No chief complaint on file.   History of Present Illness: Carmen Brown is a 83 y.o. female here for follow up for gout on allopurinol 300 mg daily.    Previous HPI 01/27/2023 Carmen Brown is a 83 y.o. female here for follow up for gout on allopurinol 300 mg daily.  Doing well at this had prednisone prescription called in for use as needed has not required additional rounds for flareup.  Continues follow-up with orthopedic surgery for osteoarthritis of the knees.    Previous HPI 07/22/22 Carmen Brown is a 83 y.o. female here for follow up for gout on allopurinol 300 mg daily. She has been doing well without  flare ups most of this year just having issue at the right foot. 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 She sees orthopedics clinic for bilateral knee injections every 6 months with good benefit.   Previous HPI 07/23/21 Carmen Brown is a 83 y.o. female here for follow up for gout on allopurinol 300 mg daily. Overall doing well she had COVID this summer and stopped taking colchicine with concern about medications and side effects at that time. She started taking tart cherry supplement regularly. She has only had one flare in the right foot for which she took steroids which resolved.   Previous HPI 01/13/21 Carmen Brown is a 83 y.o. female here for follow up for gout currently on allopurinol 300 mg p.o. daily and colchicine 0.6 mg daily.  Symptoms are doing pretty well she has not experienced any significant flare or attacks since the last visit.  She has noticed some increased weight gain attributes this to a lot of difficulty maintaining a good or balanced diet while  constantly traveling.  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   Review of Systems  Constitutional:  Positive for fatigue.  HENT:  Negative for mouth sores and mouth dryness.   Eyes:  Negative for dryness.  Respiratory:  Positive for shortness of breath.   Cardiovascular:  Negative for chest pain and palpitations.  Gastrointestinal:  Negative for blood in stool, constipation and diarrhea.  Endocrine: Negative for increased urination.  Genitourinary:  Negative for involuntary urination.  Musculoskeletal:  Positive for myalgias and myalgias. Negative for joint pain, gait problem, joint pain, joint swelling, muscle weakness, morning stiffness and muscle tenderness.  Skin:  Negative for color change, rash, hair loss and sensitivity to sunlight.  Allergic/Immunologic: Negative for susceptible to infections.  Neurological:  Negative for dizziness and headaches.  Hematological:  Negative for swollen glands.  Psychiatric/Behavioral:  Positive for sleep disturbance. Negative for depressed mood. The patient is not nervous/anxious.     PMFS History:  Patient Active Problem List   Diagnosis Date Noted   Elevated ALT measurement 10/13/2023   Lymphadenopathy 09/14/2023   Acute pain of left shoulder 06/23/2023   Abnormal serum iron level  04/06/2023   Palpitations 04/06/2023   Restless leg syndrome 08/26/2022   Prediabetes 08/26/2022   Need for vaccination 05/20/2022   Snoring 05/20/2022   Stage 2 chronic kidney disease 02/11/2022   Chronic cough 12/31/2021   Colon cancer screening 01/22/2021   Idiopathic gout of multiple sites 05/21/2020   Generalized osteoarthritis 05/21/2020   Fatigue 11/25/2019   Hyperlipidemia 11/20/2017   CAD (coronary artery disease) 09/09/2017   Unstable angina (HCC) 09/07/2017   Osteoarthritis of left knee  06/08/2016   Osteoarthritis of right knee 06/08/2016   Degenerative disc disease, lumbar 04/06/2015   PAROXYSMAL VENTRICULAR TACHYCARDIA 08/21/2009   Obesity 10/12/2006   Hypertension 10/12/2006   CARDIOMEGALY 10/12/2006   RHINITIS, ALLERGIC 10/12/2006   OSTEOARTHRITIS, LOWER LEG 10/12/2006   CERVICAL SPINE DISORDER, NOS 10/12/2006    Past Medical History:  Diagnosis Date   Allergy    Arthritis    CAD (coronary artery disease)    95% proximal LAD stenosis with DES.  Jan 2019   Cardiomegaly    Cardiomyopathy (HCC)    EF 35% in the distant past but NL EF 2009   Hypertension, benign    systemic   Menopausal syndrome    Obesity    Osteoarthritis of lower leg, localized    Paroxysmal VT (HCC)    Thyroid disease     Family History  Problem Relation Age of Onset   Heart disease Mother 46       MI   Heart disease Father 8       CABG   Cancer Sister        lyphoma and ovarian   CAD Sister 37       Stent   Lymphoma Sister    Diabetes Maternal Uncle    Stroke Maternal Grandmother    Diabetes Maternal Grandfather    Past Surgical History:  Procedure Laterality Date   CARPAL TUNNEL RELEASE Right 11/2016   CATARACT EXTRACTION  2020   Per patient   LEFT HEART CATH AND CORONARY ANGIOGRAPHY N/A 09/08/2017   Procedure: LEFT HEART CATH AND CORONARY ANGIOGRAPHY;  Surgeon: Yvonne Kendall, MD;  [facility];  Service: Cardiovascular;  Laterality: N/A;   MASS EXCISION  03/15/2012   Procedure: MINOR EXCISION OF MASS;  Surgeon: Wyn Forster., MD;  Location: [facility];  Service: Orthopedics;  Laterality: Right;  debride IP right long finger, excision mucoid cyst   ventricular tacticartia     Social History   Social History Narrative   Financial controller for [employer].  Education: 89yrs.  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 Caffeine 2 cups decaf. (not every day).   Immunization History  Administered Date(s) Administered   Fluad Quad(high Dose 65+) 07/16/2020, 07/23/2021,  05/20/2022   Influenza Split 08/28/2011   Influenza, High Dose Seasonal PF 07/19/2018   Influenza,inj,Quad PF,6+ Mos 07/22/2014   Influenza-Unspecified 05/29/2016, 05/15/2017   Moderna Sars-Covid-2 Vaccination 09/13/2019, 10/09/2019, 05/15/2020   Pneumococcal Polysaccharide-23 07/28/2009   Td 07/15/2000, 02/21/2012   Tdap 02/21/2012   Zoster Recombinant(Shingrix) 08/20/2018, 01/31/2019   Zoster, Live 05/16/2011     Objective: Vital Signs: There were no vitals taken for this visit.   Physical Exam   Musculoskeletal Exam: ***  CDAI Exam: CDAI Score: -- Patient Global: --; Provider Global: -- Swollen: --; Tender: -- Joint Exam 11/03/2023   No joint exam has been documented for this visit   There is currently no information documented on the homunculus. Go to the Rheumatology activity and complete the  homunculus joint exam.  Investigation: No additional findings.  Imaging: No results found.  Recent Labs: Lab Results  Component Value Date   WBC 6.3 04/06/2023   HGB 14.6 04/06/2023   PLT 229.0 04/06/2023   NA 140 09/14/2023   K 4.1 09/14/2023   CL 104 09/14/2023   CO2 28 09/14/2023   GLUCOSE 99 09/14/2023   BUN 20 09/14/2023   CREATININE 0.90 09/14/2023   BILITOT 0.6 09/14/2023   ALKPHOS 71 09/14/2023   AST 27 09/14/2023   ALT 38 (H) 09/14/2023   PROT 7.0 09/14/2023   ALBUMIN 4.2 09/14/2023   CALCIUM 9.2 09/14/2023   GFRAA 74 01/13/2021    Speciality Comments: No specialty comments available.  Procedures:  No procedures performed Allergies: Epinephrine and Latex   Assessment / Plan:     Visit Diagnoses: No diagnosis found.  ***  Orders: No orders of the defined types were placed in this encounter.  No orders of the defined types were placed in this encounter.    Follow-Up Instructions: No follow-ups on file.   Metta Clines, RT  Note - This record has been created using [employer].  Chart creation errors have been sought, but may not  always  have been located. Such creation errors do not reflect on  the standard of medical care.

## 2023-10-20 NOTE — Progress Notes (Signed)
 Please call patient and let her know it has been a few months since her last CT scan and as we discussed I am ordering repeat scan now to monitor the enlarged lymph node. Scheduling should call within a week or so to get this test scheduled. If she has any questions please let me know.

## 2023-10-26 ENCOUNTER — Ambulatory Visit: Payer: BC Managed Care – PPO | Admitting: Cardiology

## 2023-11-03 ENCOUNTER — Encounter: Payer: Self-pay | Admitting: Internal Medicine

## 2023-11-03 ENCOUNTER — Ambulatory Visit: Payer: BC Managed Care – PPO | Attending: Internal Medicine | Admitting: Internal Medicine

## 2023-11-03 VITALS — BP 115/78 | HR 97 | Resp 14 | Ht 64.5 in | Wt 219.0 lb

## 2023-11-03 DIAGNOSIS — M51369 Other intervertebral disc degeneration, lumbar region without mention of lumbar back pain or lower extremity pain: Secondary | ICD-10-CM | POA: Diagnosis not present

## 2023-11-03 DIAGNOSIS — M1A09X0 Idiopathic chronic gout, multiple sites, without tophus (tophi): Secondary | ICD-10-CM | POA: Diagnosis not present

## 2023-11-03 DIAGNOSIS — Z5181 Encounter for therapeutic drug level monitoring: Secondary | ICD-10-CM

## 2023-11-03 MED ORDER — ALLOPURINOL 300 MG PO TABS: ORAL_TABLET | ORAL | 1 refills | Status: DC

## 2023-11-03 NOTE — Progress Notes (Addendum)
 Office Visit Note  Patient: Carmen Brown             Date of Birth: 19-Jul-1941           MRN: [id number]             PCP: Elenore Paddy, NP Referring: Elenore Paddy, NP Visit Date: 11/03/2023   Subjective:  Follow-up  History of Present Illness: Carmen Brown is a 83 y.o. female here for follow up for gout on allopurinol 300 mg daily.  She has not had any flares, no had to use any prednisone. Denies any joint swelling, redness or tenderness. She usually gets her gout on her feet. Has been doing well taking the allopurinol 300mg  daily.   She unfortunately fell in her garage about three weeks ago. She twisted her left ankle and fell on her right side. She injured her knee and right lower back. He saw her orthopedist who gave her a prednisone injection last week and just recently got an HA injection. She states that this helped the pain in her knee.   She has seen a chiropractor for her lower back. She was told to move but finds this really hard to do 2/2 pain. She has been unable to work. Tried some tylenol but has not been taking it. Her pain is dull and primarily over the right lower back. She feels tense and really tired.   Previous HPI 01/27/2023 Carmen Brown is a 83 y.o. female here for follow up for gout on allopurinol 300 mg daily.  Doing well at this had prednisone prescription called in for use as needed has not required additional rounds for flareup.  Continues follow-up with orthopedic surgery for osteoarthritis of the knees.    Previous HPI 07/22/22 Carmen Brown is a 83 y.o. female here for follow up for gout on allopurinol 300 mg daily. She has been doing well without  flare ups most of this year just having issue at the right foot. 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 She sees orthopedics clinic for bilateral knee injections every 6 months with good benefit.   Previous  HPI 07/23/21 Carmen Brown is a 83 y.o. female here for follow up for gout on allopurinol 300 mg daily. Overall doing well she had COVID this summer and stopped taking colchicine with concern about medications and side effects at that time. She started taking tart cherry supplement regularly. She has only had one flare in the right foot for which she took steroids which resolved.   Previous HPI 01/13/21 Carmen Brown is a 83 y.o. female here for follow up for gout currently on allopurinol 300 mg p.o. daily and colchicine 0.6 mg daily.  Symptoms are doing pretty well she has not experienced any significant flare or attacks since the last visit.  She has noticed some increased weight gain attributes this to a lot of difficulty maintaining a good or balanced diet while constantly traveling.  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   Review of Systems  Constitutional:  Positive for fatigue.  HENT:  Negative for mouth sores and mouth dryness.   Eyes:  Negative for dryness.  Respiratory:  Positive for shortness of breath.   Cardiovascular:  Negative for chest pain and palpitations.  Gastrointestinal:  Negative for blood in stool, constipation and diarrhea.  Endocrine: Negative for increased urination.  Genitourinary:  Negative for involuntary urination.  Musculoskeletal:  Positive for myalgias and myalgias. Negative for joint pain, gait problem, joint pain, joint swelling, muscle weakness, morning stiffness and muscle tenderness.  Skin:  Negative for color change, rash, hair loss and sensitivity to sunlight.  Allergic/Immunologic: Negative for susceptible to infections.  Neurological:  Negative for dizziness and headaches.  Hematological:  Negative for swollen glands.  Psychiatric/Behavioral:  Positive for sleep disturbance. Negative for  depressed mood. The patient is not nervous/anxious.     PMFS History:  Patient Active Problem List   Diagnosis Date Noted   Elevated ALT measurement 10/13/2023   Lymphadenopathy 09/14/2023   Acute pain of left shoulder 06/23/2023   Abnormal serum iron level 04/06/2023   Palpitations 04/06/2023   Restless leg syndrome 08/26/2022   Prediabetes 08/26/2022   Need for vaccination 05/20/2022   Snoring 05/20/2022   Stage 2 chronic kidney disease 02/11/2022   Chronic cough 12/31/2021   Colon cancer screening 01/22/2021   Idiopathic gout of multiple sites 05/21/2020   Generalized osteoarthritis 05/21/2020   Fatigue 11/25/2019   Hyperlipidemia 11/20/2017   CAD (coronary artery disease) 09/09/2017   Unstable angina (HCC) 09/07/2017   Osteoarthritis of left knee 06/08/2016   Osteoarthritis of right knee 06/08/2016   Degenerative disc disease, lumbar 04/06/2015   PAROXYSMAL VENTRICULAR TACHYCARDIA 08/21/2009   Obesity 10/12/2006   Hypertension 10/12/2006   CARDIOMEGALY 10/12/2006   RHINITIS, ALLERGIC 10/12/2006   OSTEOARTHRITIS, LOWER LEG 10/12/2006   CERVICAL SPINE DISORDER, NOS 10/12/2006    Past Medical History:  Diagnosis Date   Allergy    Arthritis    CAD (coronary artery disease)    95% proximal LAD stenosis with DES.  Jan 2019   Cardiomegaly    Cardiomyopathy (HCC)    EF 35% in the distant past but NL EF 2009   Hypertension, benign    systemic   Menopausal syndrome    Obesity    Osteoarthritis of lower leg, localized    Paroxysmal VT (HCC)    Thyroid disease     Family History  Problem Relation Age of Onset   Heart disease Mother 65       MI   Heart disease Father 35       CABG   Cancer Sister        lyphoma and ovarian   CAD Sister 67       Stent   Lymphoma Sister    Diabetes Maternal Uncle    Stroke Maternal Grandmother    Diabetes Maternal Grandfather    Past Surgical History:  Procedure Laterality Date   CARPAL TUNNEL RELEASE Right 11/2016    CATARACT EXTRACTION  2020   Per patient   LEFT HEART CATH AND CORONARY ANGIOGRAPHY N/A 09/08/2017   Procedure: LEFT HEART CATH AND CORONARY ANGIOGRAPHY;  Surgeon: Yvonne Kendall, MD;  Location: [facility];  Service: Cardiovascular;  Laterality: N/A;   MASS EXCISION  03/15/2012   Procedure: MINOR EXCISION OF MASS;  Surgeon: Wyn Forster., MD;  Location: [facility];  Service: Orthopedics;  Laterality: Right;  debride IP right long finger, excision mucoid cyst   ventricular tacticartia     Social History   Social History Narrative   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 Caffeine 2 cups decaf. (not every day).  Immunization History  Administered Date(s) Administered   Fluad Quad(high Dose 65+) 07/16/2020, 07/23/2021, 05/20/2022   Influenza Split 08/28/2011   Influenza, High Dose Seasonal PF 07/19/2018   Influenza,inj,Quad PF,6+ Mos 07/22/2014   Influenza-Unspecified 05/29/2016, 05/15/2017   Moderna Sars-Covid-2 Vaccination 09/13/2019, 10/09/2019, 05/15/2020   Pneumococcal Polysaccharide-23 07/28/2009   Td 07/15/2000, 02/21/2012   Tdap 02/21/2012   Zoster Recombinant(Shingrix) 08/20/2018, 01/31/2019   Zoster, Live 05/16/2011     Objective: Vital Signs: BP 115/78 (BP Location: Left Arm, Patient Position: Sitting, Cuff Size: Large)   Pulse 97   Resp 14   Ht 5' 4.5" (1.638 m)   Wt 219 lb (99.3 kg)   BMI 37.01 kg/m    Physical Exam Constitutional:      General: She is not in acute distress.    Appearance: She is not ill-appearing.  Cardiovascular:     Rate and Rhythm: Normal rate and regular rhythm.     Heart sounds: No murmur heard.    No friction rub. No gallop.  Pulmonary:     Effort: No respiratory distress.     Breath sounds: No wheezing, rhonchi or rales.  Musculoskeletal:     Cervical back: No rigidity or tenderness.     Right lower leg: No edema.     Left lower leg: No edema.  Lymphadenopathy:     Cervical: No  cervical adenopathy.  Skin:    General: Skin is warm and dry.     Capillary Refill: Capillary refill takes less than 2 seconds.     Findings: No lesion or rash.  Neurological:     General: No focal deficit present.     Mental Status: She is oriented to person, place, and time.     Cranial Nerves: No cranial nerve deficit.     Motor: No weakness.     Gait: Gait abnormal.    Musculoskeletal Exam:  Neck full ROM no tenderness Shoulders full ROM no tenderness or swelling Elbows full ROM no tenderness or swelling Wrists full ROM no tenderness or swelling Fingers full ROM no tenderness or swelling No paraspinal tenderness to palpation over upper and lower back, negative straight leg test.  Hip normal internal and external rotation without pain, no tenderness to lateral hip palpation Knees full ROM no tenderness or swelling Ankles full ROM no tenderness or swelling MTPs full ROM no tenderness or swelling  Investigation: No additional findings.  Imaging: No results found.  Recent Labs: Lab Results  Component Value Date   WBC 6.3 04/06/2023   HGB 14.6 04/06/2023   PLT 229.0 04/06/2023   NA 140 09/14/2023   K 4.1 09/14/2023   CL 104 09/14/2023   CO2 28 09/14/2023   GLUCOSE 99 09/14/2023   BUN 20 09/14/2023   CREATININE 0.90 09/14/2023   BILITOT 0.6 09/14/2023   ALKPHOS 71 09/14/2023   AST 27 09/14/2023   ALT 38 (H) 09/14/2023   PROT 7.0 09/14/2023   ALBUMIN 4.2 09/14/2023   CALCIUM 9.2 09/14/2023   GFRAA 74 01/13/2021    Speciality Comments: No specialty comments available.  Procedures:  No procedures performed Allergies: Epinephrine and Latex   Assessment / Plan:     Visit Diagnoses:   Idiopathic chronic gout of multiple sites without tophus - 01/27/2023  PT has been well controlled on Allopurinol 300mg  daily. She has not needed any prednisone. Has not noticed any swelling, tenderness, or redness in her joints. Uric acid was at goal at 4.6 in the past.   -Recheck  uric acid  -Cw allopurinol 300mg  daily  -Consider decreasing dose 1/2 tablet if stable still well below 6  Lumbar back pain without radiculopathy Degenerative Disc Disease  No signs of radiculopathy or muscle tensing. Pain worse on the Right that on the left. Has been limiting her daily activities, cannot go to work, and is slowly walking. She is primarily more fatigued that in pain. Lungs are clear to auscultation. Does not have a history of anemia. She may have some deconditioning. Would benefit from referral to PT.  -Referral to PT   Orders: Orders Placed This Encounter  Procedures   Uric acid   Ambulatory referral to Physical Therapy   Meds ordered this encounter  Medications   allopurinol (ZYLOPRIM) 300 MG tablet    Sig: TAKE 1 TABLET(300 MG) BY MOUTH DAILY    Dispense:  90 tablet    Refill:  1   Follow-Up Instructions: Return in about 6 months (around 05/05/2024) for Gout on allopurinol f/u 6mos.   Manuela Neptune, MD  Patient seen and evaluated with Dr. Dimple Casey  Note - This record has been created using Dragon software.  Chart creation errors have been sought, but may not always  have been located. Such creation errors do not reflect on  the standard of medical care.

## 2023-11-04 ENCOUNTER — Other Ambulatory Visit: Payer: Self-pay | Admitting: Cardiology

## 2023-11-04 DIAGNOSIS — R0602 Shortness of breath: Secondary | ICD-10-CM

## 2023-11-04 DIAGNOSIS — I2510 Atherosclerotic heart disease of native coronary artery without angina pectoris: Secondary | ICD-10-CM

## 2023-11-04 DIAGNOSIS — E785 Hyperlipidemia, unspecified: Secondary | ICD-10-CM

## 2023-11-04 DIAGNOSIS — I10 Essential (primary) hypertension: Secondary | ICD-10-CM

## 2023-11-04 LAB — URIC ACID: Uric Acid, Serum: 6.2 mg/dL (ref 2.5–7.0)

## 2023-11-05 ENCOUNTER — Other Ambulatory Visit: Payer: Self-pay | Admitting: Cardiology

## 2023-11-05 DIAGNOSIS — I2510 Atherosclerotic heart disease of native coronary artery without angina pectoris: Secondary | ICD-10-CM

## 2023-11-05 DIAGNOSIS — I10 Essential (primary) hypertension: Secondary | ICD-10-CM

## 2023-11-05 DIAGNOSIS — E785 Hyperlipidemia, unspecified: Secondary | ICD-10-CM

## 2023-11-05 DIAGNOSIS — R0602 Shortness of breath: Secondary | ICD-10-CM

## 2023-11-09 DIAGNOSIS — R001 Bradycardia, unspecified: Secondary | ICD-10-CM | POA: Insufficient documentation

## 2023-11-09 NOTE — Progress Notes (Unsigned)
  Cardiology Office Note:   Date:  11/10/2023  ID:  [name], [name] 01-07-1941, MRN [id number] PCP: Elenore Paddy, NP  [facility] Providers Cardiologist:  Rollene Rotunda, MD {  History of Present Illness:   Carmen Brown is a 83 y.o. female who presents for follow up of CAD.  She was admitted with [place] in Jan 2019.  She was found to have 95% LAD stenosis treated stent.  EF was normal.    When I last saw her she in 2019 she had SOB and I ordered a POET (Plain Old Exercise Treadmill) which was negative for ischemia in August 2021.     Since I last saw her she has hurt her knee and her back and has limited her a little bit.  She is doing some household chores. The patient denies any new symptoms such as chest discomfort, neck or arm discomfort. There has been no new shortness of breath, PND or orthopnea. There have been no reported palpitations, presyncope or syncope.  She has had some chronic dyspnea which probably is made worse by some weight gain.  She has had a workup of this.  She had an echo last fall that was unremarkable.  She has had a CT to look at her aorta which was not significantly enlarged.  There are some small lymph nodes that are being followed.  There is no acute lung disease.  ROS: As stated in the HPI and negative for all other systems.  Studies Reviewed:    EKG:   NA    Risk Assessment/Calculations:         Physical Exam:   VS:  BP (!) 144/82 (BP Location: Left Arm, Patient Position: Sitting, Cuff Size: Normal)   Pulse 80   Ht 5' 4.5" (1.638 m)   Wt 223 lb (101.2 kg)   SpO2 92%   BMI 37.69 kg/m    Wt Readings from Last 3 Encounters:  11/10/23 223 lb (101.2 kg)  11/03/23 219 lb (99.3 kg)  10/13/23 220 lb (99.8 kg)     GENERAL:  Well appearing NECK:  No jugular venous distention, waveform within normal limits, carotid upstroke brisk and symmetric, no bruits, no thyromegaly LUNGS:  Clear to auscultation bilaterally CHEST:  Unremarkable HEART:   PMI not displaced or sustained,S1 and S2 within normal limits, no S3, no S4, no clicks, no rubs, no murmurs ABD:  Flat, positive bowel sounds normal in frequency in pitch, no bruits, no rebound, no guarding, no midline pulsatile mass, no hepatomegaly, no splenomegaly EXT:  2 plus pulses throughout, no edema, no cyanosis no clubbing  ASSESSMENT AND PLAN:   CAD:   The patient has no new sypmtoms.  No further cardiovascular testing is indicated.  We will continue with aggressive risk reduction and meds as listed.   HTN:  The blood pressure is mildly elevated.  However, she says this is always well-controlled in the 110s at home.  No change in therapy.   DYSLIPIDEMIA: LDL is at goal.  No change in therapy.  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  I will follow this in a couple of years with repeat imaging.  Follow up with me in 1 year  Signed, Rollene Rotunda, MD

## 2023-11-10 ENCOUNTER — Encounter: Payer: Self-pay | Admitting: Cardiology

## 2023-11-10 ENCOUNTER — Ambulatory Visit: Attending: Cardiology | Admitting: Cardiology

## 2023-11-10 VITALS — BP 144/82 | HR 80 | Ht 64.5 in | Wt 223.0 lb

## 2023-11-10 DIAGNOSIS — I10 Essential (primary) hypertension: Secondary | ICD-10-CM

## 2023-11-10 DIAGNOSIS — I2510 Atherosclerotic heart disease of native coronary artery without angina pectoris: Secondary | ICD-10-CM

## 2023-11-10 DIAGNOSIS — R001 Bradycardia, unspecified: Secondary | ICD-10-CM

## 2023-11-10 NOTE — Patient Instructions (Signed)
 Medication Instructions:  No changes.  *If you need a refill on your cardiac medications before your next appointment, please call your pharmacy*   Follow-Up: At [facility], you and your health needs are our priority.  As part of our continuing mission to provide you with exceptional heart care, our providers are all part of one team.  This team includes your primary Cardiologist (physician) and Advanced Practice Providers or APPs (Physician Assistants and Nurse Practitioners) who all work together to provide you with the care you need, when you need it.  Your next appointment:   1 year(s)  Provider:   Rollene Rotunda, MD     We recommend signing up for the patient portal called "MyChart".  Sign up information is provided on this After Visit Summary.  MyChart is used to connect with patients for Virtual Visits (Telemedicine).  Patients are able to view lab/test results, encounter notes, upcoming appointments, etc.  Non-urgent messages can be sent to your provider as well.   To learn more about what you can do with MyChart, go to [email or web address].   Other Instructions       1st Floor: - Lobby - Registration  - Pharmacy  - Lab - Cafe  2nd Floor: - PV Lab - Diagnostic Testing (echo, CT, nuclear med)  3rd Floor: - Vacant  4th Floor: - TCTS (cardiothoracic surgery) - AFib Clinic - Structural Heart Clinic - Vascular Surgery  - Vascular Ultrasound  5th Floor: - HeartCare Cardiology (general and EP) - Clinical Pharmacy for coumadin, hypertension, lipid, weight-loss medications, and med management appointments    Valet parking services will be available as well.

## 2023-11-14 ENCOUNTER — Ambulatory Visit: Attending: Internal Medicine | Admitting: Physical Therapy

## 2023-11-14 ENCOUNTER — Encounter: Payer: Self-pay | Admitting: Physical Therapy

## 2023-11-14 ENCOUNTER — Other Ambulatory Visit: Payer: Self-pay

## 2023-11-14 DIAGNOSIS — M51369 Other intervertebral disc degeneration, lumbar region without mention of lumbar back pain or lower extremity pain: Secondary | ICD-10-CM | POA: Insufficient documentation

## 2023-11-14 DIAGNOSIS — M6281 Muscle weakness (generalized): Secondary | ICD-10-CM | POA: Insufficient documentation

## 2023-11-14 DIAGNOSIS — R2689 Other abnormalities of gait and mobility: Secondary | ICD-10-CM | POA: Diagnosis present

## 2023-11-14 DIAGNOSIS — M5459 Other low back pain: Secondary | ICD-10-CM | POA: Diagnosis present

## 2023-11-14 NOTE — Therapy (Signed)
 OUTPATIENT PHYSICAL THERAPY THORACOLUMBAR EVALUATION   Patient Name: Carmen Brown MRN: [id number] DOB:01/30/1941, 83 y.o., female Today's Date: 11/14/2023  END OF SESSION:  PT End of Session - 11/14/23 1356     Visit Number 1    Number of Visits 13    Date for PT Re-Evaluation 12/26/23    PT Start Time 1355    PT Stop Time 1440    PT Time Calculation (min) 45 min             Past Medical History:  Diagnosis Date   Allergy    Arthritis    CAD (coronary artery disease)    95% proximal LAD stenosis with DES.  Jan 2019   Cardiomegaly    Cardiomyopathy (HCC)    EF 35% in the distant past but NL EF 2009   Hypertension, benign    systemic   Menopausal syndrome    Obesity    Osteoarthritis of lower leg, localized    Paroxysmal VT (HCC)    Thyroid disease    Past Surgical History:  Procedure Laterality Date   CARPAL TUNNEL RELEASE Right 11/2016   CATARACT EXTRACTION  2020   Per patient   LEFT HEART CATH AND CORONARY ANGIOGRAPHY N/A 09/08/2017   Procedure: LEFT HEART CATH AND CORONARY ANGIOGRAPHY;  Surgeon: Yvonne Kendall, MD;  Location: [facility];  Service: Cardiovascular;  Laterality: N/A;   MASS EXCISION  03/15/2012   Procedure: MINOR EXCISION OF MASS;  Surgeon: Wyn Forster., MD;  Location: [facility];  Service: Orthopedics;  Laterality: Right;  debride IP right long finger, excision mucoid cyst   ventricular tacticartia     Patient Active Problem List   Diagnosis Date Noted   Bradycardia 11/09/2023   Elevated ALT measurement 10/13/2023   Lymphadenopathy 09/14/2023   Acute pain of left shoulder 06/23/2023   Abnormal serum iron level 04/06/2023   Palpitations 04/06/2023   Restless leg syndrome 08/26/2022   Prediabetes 08/26/2022   Need for vaccination 05/20/2022   Snoring 05/20/2022   Stage 2 chronic kidney disease 02/11/2022   Chronic cough 12/31/2021   Colon cancer screening 01/22/2021   Idiopathic gout of multiple sites  05/21/2020   Generalized osteoarthritis 05/21/2020   Fatigue 11/25/2019   Hyperlipidemia 11/20/2017   CAD (coronary artery disease) 09/09/2017   Unstable angina (HCC) 09/07/2017   Osteoarthritis of left knee 06/08/2016   Osteoarthritis of right knee 06/08/2016   Degenerative disc disease, lumbar 04/06/2015   PAROXYSMAL VENTRICULAR TACHYCARDIA 08/21/2009   Obesity 10/12/2006   Hypertension 10/12/2006   CARDIOMEGALY 10/12/2006   RHINITIS, ALLERGIC 10/12/2006   OSTEOARTHRITIS, LOWER LEG 10/12/2006   CERVICAL SPINE DISORDER, NOS 10/12/2006    PCP: Elenore Paddy, NP  REFERRING PROVIDER: Fuller Plan, MD  REFERRING DIAG: [id number] (ICD-10-CM) - Degeneration of intervertebral disc of lumbar region, unspecified whether pain present   Rationale for Evaluation and Treatment: Rehabilitation  THERAPY DIAG:  Other low back pain  Muscle weakness (generalized)  Other abnormalities of gait and mobility  PERTINENT HISTORY: HTN, tachycardia, Cardiomegaly, unstable angina, CAD, CKD2  WEIGHT BEARING RESTRICTIONS: No  FALLS:  Has patient fallen in last 6 months? Yes. 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 Fell on R knee, already is a bad knee and couldn't walk for a while, but was able to get  an injection that has helped  R knee pain. Currently working with a [employer], feels like its been helping . Difficulties with prolonged standing or walking, gets tired quickly. Pt reports 2/10 pain currently, gets worse when walking, feels like her body is tired and has weights around her. No n/t symptoms  RED FLAGS: None, Bowel or bladder incontinence: No, and Compression fracture: No    PLOF: Independent  PATIENT GOALS: get back pain better  NEXT MD VISIT: may ---------------------------------------------------------------------------------------------  OBJECTIVE:  Note: Objective measures were completed at Evaluation unless otherwise noted.  DIAGNOSTIC FINDINGS:  No recent imaging  PATIENT SURVEYS:  Modified Oswestry 21/50 (42%)   COGNITION: Overall cognitive status: Within functional limits for tasks assessed   PALPATION: No TTP  Lumbar contraction pattern  L Multifidus: Low quality contraction  R Multifidus:low quality contraction   SENSATION: WFL  MUSCLE LENGTH: Hamstrings: Right 0; deg; Left 0 deg  Thomas test: tight rec fem  POSTURE: increased lumbar lordosis   LUMBAR ROM:   AROM eval  Flexion 90%  Extension 30%  Right lateral flexion 60%  Left lateral flexion 60%  Right rotation 60%  Left rotation 60%   (Blank rows = not tested)  ! Indicates pain with testing  LOWER EXTREMITY ROM:     Active  Right eval Left eval  Hip flexion [facility]  Hip extension [facility]  Hip abduction    Hip adduction    Hip internal rotation    Hip external rotation    Knee flexion    Knee extension    Ankle dorsiflexion    Ankle plantarflexion    Ankle inversion    Ankle eversion     (Blank rows = not tested)  ! Indicates pain with testing  LOWER EXTREMITY MMT:    MMT Right eval Left eval  Hip flexion 4- 4-  Hip extension    Hip abduction 4 4  Hip adduction    Hip internal rotation    Hip external rotation    Knee flexion 4+ 4+  Knee extension 4 4  Ankle dorsiflexion     Ankle plantarflexion    Ankle inversion    Ankle eversion     (Blank rows = not tested)   ! Indicates pain with testing  GAIT: Distance walked: 159ft Assistive device utilized: None Level of assistance: Complete Independence  [facility] Adult PT Treatment:                                                DATE: 11/14/2023  Self Care: Pt education POC discussion  PATIENT EDUCATION:  Education details: Pt received education regarding HEP performance, ADL performance, functional activity tolerance, impairment education, appropriate performance of therapeutic activities.  Person educated: Patient Education method: Explanation, Demonstration, Tactile cues, Verbal cues, and Handouts Education comprehension: verbalized understanding and returned demonstration  HOME EXERCISE PROGRAM: Access Code: [facility] URL: [email or web address] Date: 11/14/2023 Prepared by: Sheliah Plane  Exercises - Supine Figure 4 core/lumbar mobility  - 1 x daily - 4-7 x weekly - 2 sets - 20 reps - Supine Lower Trunk Rotation  - 1 x daily - 7 x weekly - 3 sets - 12 reps - 2s hold - Clamshell with Resistance  - 1 x daily - 7 x weekly - 2-3 sets - 10 reps - 3s hold - Supine Bridge with Resistance Band  - 1 x daily - 7 x weekly - 3 sets - 8 reps - 3s hold ---------------------------------------------------------------------------------------------  ASSESSMENT:  CLINICAL IMPRESSION: Eval impression (11/14/2023): Pt. attended today's physical therapy session for evaluation of low back pain following a fall on feb 28th. Pt has complaints of 2/10 pain that gets worse with prolonged walking and standing. Pt has notable deficits with core activation patterns, global B hip strength, and lumbar AROM.   Pt would benefit from therapeutic focus on lumbo-pelvic stabilization, dynamic core  activation, and global hip strengthening.  Treatment performed today focused on pt education detailed in obj. Pt demonstrated good understanding of education provided. required moderate verbal/tactile cues and no physical assistance for appropriate performance with today's activities. Pt was unable to enter a prone position due to personal preference stating that "I just don't." Pt requires the intervention of skilled outpatient physical therapy to address the aforementioned deficits and progress towards a functional level in line with therapeutic goals.    OBJECTIVE IMPAIRMENTS: decreased activity tolerance, difficulty walking, decreased strength, impaired flexibility, improper body mechanics, and pain.   ACTIVITY LIMITATIONS: carrying, squatting, locomotion level, and caring for others  PARTICIPATION LIMITATIONS: driving, shopping, community activity, and occupation  PERSONAL FACTORS: Age, Behavior pattern, Fitness, Profession, and Time since onset of injury/illness/exacerbation are also affecting patient's functional outcome.   REHAB POTENTIAL: Good  CLINICAL DECISION MAKING: Stable/uncomplicated  EVALUATION COMPLEXITY: Low   GOALS: Goals reviewed with patient? YES  SHORT TERM GOALS: Target date: 12/05/2023  Pt will be independent with administered HEP to demonstrate the competency necessary for long term managemnet of symptoms at home. Baseline: Goal status: INITIAL  LONG TERM GOALS: Target date: 01/09/2024   Pt. Will achieve a MODI score of 15/50 (30%)  as to demonstrate improvement in self-perceived functional ability with daily activities.  Baseline: 21/50 (42%) Goal status: INITIAL  2.  Pt will improve Global hip strength to a 4+/5 to demonstrate improvement in strength for quality of motion and activity performance.  Baseline: see obj chart Goal status: INITIAL  3.  Pt will improve Lumbar AROM to 90% of standardized norms with less than 2/10 pain to demonstrate  necessary mobility for high quality and safe ADLs  Baseline:  Goal status: INITIAL  4.  Pt will report the ability to stand for >/= 30 minutes as to demonstrate improved tolerance to standing for prolonged time and improved ability to participate in occupational tasks.  Baseline:  Goal status: INITIAL   ---------------------------------------------------------------------------------------------  PLAN:  PT FREQUENCY: 2x/week  PT DURATION: 6 weeks  PLANNED INTERVENTIONS: 97110-Therapeutic exercises, 97530- Therapeutic activity, [id number]- Neuromuscular re-education, 97535- Self Care, 95621- Manual therapy, [phone]- Gait training, [phone]- Aquatic Therapy, Patient/Family education, Balance training, Stair training, Joint  mobilization, and Spinal mobilization.  PLAN FOR NEXT SESSION: Review HEP, Begin POC as detailed in assessment   Sheliah Plane, PT, DPT 11/14/2023, 2:42 PM   For all possible CPT codes, reference the Planned Interventions line above.     Check all conditions that are expected to impact treatment: {Conditions expected to impact treatment:None of these apply   If treatment provided at initial evaluation, no treatment charged due to lack of authorization.

## 2023-11-20 NOTE — Therapy (Unsigned)
 OUTPATIENT PHYSICAL THERAPY TREATMENT NOTE   Patient Name: Carmen Brown MRN: [id number] DOB:[birth year], 83 y.o., female Today's Date: 11/21/2023  END OF SESSION:  PT End of Session - 11/21/23 1404     Visit Number 2    Number of Visits 13    Date for PT Re-Evaluation 12/26/23    Authorization Type [employer]    PT Start Time 1400    PT Stop Time 1440    PT Time Calculation (min) 40 min    Activity Tolerance Patient tolerated treatment well    Behavior During Therapy WFL for tasks assessed/performed              Past Medical History:  Diagnosis Date   Allergy    Arthritis    CAD (coronary artery disease)    95% proximal LAD stenosis with DES.  Jan 2019   Cardiomegaly    Cardiomyopathy (HCC)    EF 35% in the distant past but NL EF 2009   Hypertension, benign    systemic   Menopausal syndrome    Obesity    Osteoarthritis of lower leg, localized    Paroxysmal VT (HCC)    Thyroid disease    Past Surgical History:  Procedure Laterality Date   CARPAL TUNNEL RELEASE Right 11/2016   CATARACT EXTRACTION  2020   Per patient   LEFT HEART CATH AND CORONARY ANGIOGRAPHY N/A 09/08/2017   Procedure: LEFT HEART CATH AND CORONARY ANGIOGRAPHY;  Surgeon: Yvonne Kendall, MD;  Location: [facility];  Service: Cardiovascular;  Laterality: N/A;   MASS EXCISION  03/15/2012   Procedure: MINOR EXCISION OF MASS;  Surgeon: Wyn Forster., MD;  Location: [facility];  Service: Orthopedics;  Laterality: Right;  debride IP right long finger, excision mucoid cyst   ventricular tacticartia     Patient Active Problem List   Diagnosis Date Noted   Bradycardia 11/09/2023   Elevated ALT measurement 10/13/2023   Lymphadenopathy 09/14/2023   Acute pain of left shoulder 06/23/2023   Abnormal serum iron level 04/06/2023   Palpitations 04/06/2023   Restless leg syndrome 08/26/2022   Prediabetes 08/26/2022   Need for vaccination 05/20/2022   Snoring 05/20/2022   Stage 2  chronic kidney disease 02/11/2022   Chronic cough 12/31/2021   Colon cancer screening 01/22/2021   Idiopathic gout of multiple sites 05/21/2020   Generalized osteoarthritis 05/21/2020   Fatigue 11/25/2019   Hyperlipidemia 11/20/2017   CAD (coronary artery disease) 09/09/2017   Unstable angina (HCC) 09/07/2017   Osteoarthritis of left knee 06/08/2016   Osteoarthritis of right knee 06/08/2016   Degenerative disc disease, lumbar 04/06/2015   PAROXYSMAL VENTRICULAR TACHYCARDIA 08/21/2009   Obesity 10/12/2006   Hypertension 10/12/2006   CARDIOMEGALY 10/12/2006   RHINITIS, ALLERGIC 10/12/2006   OSTEOARTHRITIS, LOWER LEG 10/12/2006   CERVICAL SPINE DISORDER, NOS 10/12/2006    PCP: Elenore Paddy, NP  REFERRING PROVIDER: Fuller Plan, MD  REFERRING DIAG: [id number] (ICD-10-CM) - Degeneration of intervertebral disc of lumbar region, unspecified whether pain present   Rationale for Evaluation and Treatment: Rehabilitation  THERAPY DIAG:  Other low back pain  Muscle weakness (generalized)  Other abnormalities of gait and mobility  PERTINENT HISTORY: HTN, tachycardia, Cardiomegaly, unstable angina, CAD, CKD2  WEIGHT BEARING RESTRICTIONS: No  FALLS:  Has patient fallen in last 6 months? Yes. 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  Symptoms improving over time and resolve when she is off her  feet.  Eval statement 11/14/2023: pt fell on Feb 28th, tripped on a gap between her garage floor and driveway, twisted her L ankle.no pain in L ankle currently. Fell on R knee, already is a bad knee and couldn't walk for a while, but was able to get  an injection that has helped R knee pain. Currently working with a [employer], feels like its been helping . Difficulties with prolonged standing or walking, gets tired quickly. Pt reports 2/10 pain currently, gets worse when walking, feels like her body is tired and has weights around her. No n/t symptoms  RED FLAGS: None, Bowel or bladder incontinence: No, and Compression fracture: No    PLOF: Independent  PATIENT GOALS: get back pain better  NEXT MD VISIT: may ---------------------------------------------------------------------------------------------  OBJECTIVE:  Note: Objective measures were completed at Evaluation unless otherwise noted.  DIAGNOSTIC FINDINGS:  No recent imaging  PATIENT SURVEYS:  Modified Oswestry 21/50 (42%)   COGNITION: Overall cognitive status: Within functional limits for tasks assessed   PALPATION: No TTP  Lumbar contraction pattern  L Multifidus: Low quality contraction  R Multifidus:low quality contraction   SENSATION: WFL  MUSCLE LENGTH: Hamstrings: Right 0; deg; Left 0 deg  Thomas test: tight rec fem  POSTURE: increased lumbar lordosis   LUMBAR ROM:   AROM eval  Flexion 90%  Extension 30%  Right lateral flexion 60%  Left lateral flexion 60%  Right rotation 60%  Left rotation 60%   (Blank rows = not tested)  ! Indicates pain with testing  LOWER EXTREMITY ROM:     Active  Right eval Left eval  Hip flexion [facility]  Hip extension [facility]  Hip abduction    Hip adduction    Hip internal rotation    Hip external rotation    Knee flexion    Knee extension    Ankle dorsiflexion    Ankle plantarflexion    Ankle inversion    Ankle eversion     (Blank rows = not tested)  !  Indicates pain with testing  LOWER EXTREMITY MMT:    MMT Right eval Left eval  Hip flexion 4- 4-  Hip extension  Hip abduction 4 4  Hip adduction    Hip internal rotation    Hip external rotation    Knee flexion 4+ 4+  Knee extension 4 4  Ankle dorsiflexion    Ankle plantarflexion    Ankle inversion    Ankle eversion     (Blank rows = not tested)   ! Indicates pain with testing  GAIT: Distance walked: 166ft Assistive device utilized: None Level of assistance: Complete Independence [facility] Adult PT Treatment:                                                DATE: 11/21/23 Therapeutic Exercise: Nustep L2 8 min Seated hamstring stretch 30s x2 B QL stretch 30s x2 Neuromuscular re-ed: PPT 3s 10x PPT with alt LE march 10/10 Therapeutic Activity: Supine hip fallouts GTB 10x B, 10/10 unilaterally Bridge against GTB 10x  [facility] Adult PT Treatment:                                                DATE: 11/14/2023  Self Care: Pt education POC discussion                                                                                                                                PATIENT EDUCATION:  Education details: Pt received education regarding HEP performance, ADL performance, functional activity tolerance, impairment education, appropriate performance of therapeutic activities.  Person educated: Patient Education method: Explanation, Demonstration, Tactile cues, Verbal cues, and Handouts Education comprehension: verbalized understanding and returned demonstration  HOME EXERCISE PROGRAM: Access Code: [facility] URL: [email or web address] Date: 11/14/2023 Prepared by: Sheliah Plane  Exercises - Supine Figure 4 core/lumbar mobility  - 1 x daily - 4-7 x weekly - 2 sets - 20 reps - Supine Lower Trunk Rotation  - 1 x daily - 7 x weekly - 3 sets - 12 reps - 2s hold - Clamshell with Resistance  - 1 x daily - 7 x weekly - 2-3 sets - 10 reps - 3s hold - Supine Bridge  with Resistance Band  - 1 x daily - 7 x weekly - 3 sets - 8 reps - 3s hold ---------------------------------------------------------------------------------------------  ASSESSMENT:  CLINICAL IMPRESSION: First f/u session.  Focus of today was introducing core strengthening and activation tasks, aerobic warm up and stretching to decompress lumbosacral region.  Able to tolerate all tasks with only fatigue noted.  Eval impression (11/14/2023): Pt. attended today's physical therapy session for evaluation of low back pain following a fall on feb 28th. Pt has complaints of 2/10 pain that gets worse with prolonged walking and standing. Pt has notable deficits with core activation patterns, global B hip strength,  and lumbar AROM.   Pt would benefit from therapeutic focus on lumbo-pelvic stabilization, dynamic core activation, and global hip strengthening.  Treatment performed today focused on pt education detailed in obj. Pt demonstrated good understanding of education provided. required moderate verbal/tactile cues and no physical assistance for appropriate performance with today's activities. Pt was unable to enter a prone position due to personal preference stating that "I just don't." Pt requires the intervention of skilled outpatient physical therapy to address the aforementioned deficits and progress towards a functional level in line with therapeutic goals.    OBJECTIVE IMPAIRMENTS: decreased activity tolerance, difficulty walking, decreased strength, impaired flexibility, improper body mechanics, and pain.   ACTIVITY LIMITATIONS: carrying, squatting, locomotion level, and caring for others  PARTICIPATION LIMITATIONS: driving, shopping, community activity, and occupation  PERSONAL FACTORS: Age, Behavior pattern, Fitness, Profession, and Time since onset of injury/illness/exacerbation are also affecting patient's functional outcome.   REHAB POTENTIAL: Good  CLINICAL DECISION MAKING:  Stable/uncomplicated  EVALUATION COMPLEXITY: Low   GOALS: Goals reviewed with patient? YES  SHORT TERM GOALS: Target date: 12/05/2023  Pt will be independent with administered HEP to demonstrate the competency necessary for long term managemnet of symptoms at home. Baseline: Goal status: INITIAL  LONG TERM GOALS: Target date: 01/09/2024   Pt. Will achieve a MODI score of 15/50 (30%)  as to demonstrate improvement in self-perceived functional ability with daily activities.  Baseline: 21/50 (42%) Goal status: INITIAL  2.  Pt will improve Global hip strength to a 4+/5 to demonstrate improvement in strength for quality of motion and activity performance.  Baseline: see obj chart Goal status: INITIAL  3.  Pt will improve Lumbar AROM to 90% of standardized norms with less than 2/10 pain to demonstrate necessary mobility for high quality and safe ADLs  Baseline:  Goal status: INITIAL  4.  Pt will report the ability to stand for >/= 30 minutes as to demonstrate improved tolerance to standing for prolonged time and improved ability to participate in occupational tasks.  Baseline:  Goal status: INITIAL   ---------------------------------------------------------------------------------------------  PLAN:  PT FREQUENCY: 2x/week  PT DURATION: 6 weeks  PLANNED INTERVENTIONS: 97110-Therapeutic exercises, 97530- Therapeutic activity, [id number]- Neuromuscular re-education, 97535- Self Care, 16109- Manual therapy, [phone]- Gait training, [phone]- Aquatic Therapy, Patient/Family education, Balance training, Stair training, Joint mobilization, and Spinal mobilization.  PLAN FOR NEXT SESSION: Review HEP, Begin POC as detailed in assessment   Sheliah Plane, PT, DPT 11/21/2023, 2:51 PM   For all possible CPT codes, reference the Planned Interventions line above.     Check all conditions that are expected to impact treatment: {Conditions expected to impact treatment:None of these apply   If  treatment provided at initial evaluation, no treatment charged due to lack of authorization.

## 2023-11-21 ENCOUNTER — Ambulatory Visit

## 2023-11-21 DIAGNOSIS — M5459 Other low back pain: Secondary | ICD-10-CM

## 2023-11-21 DIAGNOSIS — M6281 Muscle weakness (generalized): Secondary | ICD-10-CM

## 2023-11-21 DIAGNOSIS — R2689 Other abnormalities of gait and mobility: Secondary | ICD-10-CM

## 2023-11-23 ENCOUNTER — Telehealth: Payer: Self-pay | Admitting: Nurse Practitioner

## 2023-11-23 ENCOUNTER — Encounter: Payer: Self-pay | Admitting: Physical Therapy

## 2023-11-23 ENCOUNTER — Ambulatory Visit: Admitting: Physical Therapy

## 2023-11-23 DIAGNOSIS — R2689 Other abnormalities of gait and mobility: Secondary | ICD-10-CM

## 2023-11-23 DIAGNOSIS — M6281 Muscle weakness (generalized): Secondary | ICD-10-CM

## 2023-11-23 DIAGNOSIS — M5459 Other low back pain: Secondary | ICD-10-CM | POA: Diagnosis not present

## 2023-11-23 NOTE — Telephone Encounter (Signed)
 [facility] called in and states pt needs a Prior Auth for her Chest CT. Please start PA ASAP.

## 2023-11-23 NOTE — Therapy (Signed)
 OUTPATIENT PHYSICAL THERAPY TREATMENT NOTE   Patient Name: Carmen Brown MRN: [id number] DOB:11-27-1940, 83 y.o., female Today's Date: 11/23/2023  END OF SESSION:  PT End of Session - 11/23/23 1357     Visit Number 3    Number of Visits 13    Date for PT Re-Evaluation 12/26/23    Authorization Type [employer]    PT Start Time 1400    PT Stop Time 1440    PT Time Calculation (min) 40 min    Activity Tolerance Patient tolerated treatment well    Behavior During Therapy WFL for tasks assessed/performed              Past Medical History:  Diagnosis Date   Allergy    Arthritis    CAD (coronary artery disease)    95% proximal LAD stenosis with DES.  Jan 2019   Cardiomegaly    Cardiomyopathy (HCC)    EF 35% in the distant past but NL EF 2009   Hypertension, benign    systemic   Menopausal syndrome    Obesity    Osteoarthritis of lower leg, localized    Paroxysmal VT (HCC)    Thyroid disease    Past Surgical History:  Procedure Laterality Date   CARPAL TUNNEL RELEASE Right 11/2016   CATARACT EXTRACTION  2020   Per patient   LEFT HEART CATH AND CORONARY ANGIOGRAPHY N/A 09/08/2017   Procedure: LEFT HEART CATH AND CORONARY ANGIOGRAPHY;  Surgeon: Yvonne Kendall, MD;  Location: [facility];  Service: Cardiovascular;  Laterality: N/A;   MASS EXCISION  03/15/2012   Procedure: MINOR EXCISION OF MASS;  Surgeon: Wyn Forster., MD;  Location: [facility];  Service: Orthopedics;  Laterality: Right;  debride IP right long finger, excision mucoid cyst   ventricular tacticartia     Patient Active Problem List   Diagnosis Date Noted   Bradycardia 11/09/2023   Elevated ALT measurement 10/13/2023   Lymphadenopathy 09/14/2023   Acute pain of left shoulder 06/23/2023   Abnormal serum iron level 04/06/2023   Palpitations 04/06/2023   Restless leg syndrome 08/26/2022   Prediabetes 08/26/2022   Need for vaccination 05/20/2022   Snoring 05/20/2022   Stage 2  chronic kidney disease 02/11/2022   Chronic cough 12/31/2021   Colon cancer screening 01/22/2021   Idiopathic gout of multiple sites 05/21/2020   Generalized osteoarthritis 05/21/2020   Fatigue 11/25/2019   Hyperlipidemia 11/20/2017   CAD (coronary artery disease) 09/09/2017   Unstable angina (HCC) 09/07/2017   Osteoarthritis of left knee 06/08/2016   Osteoarthritis of right knee 06/08/2016   Degenerative disc disease, lumbar 04/06/2015   PAROXYSMAL VENTRICULAR TACHYCARDIA 08/21/2009   Obesity 10/12/2006   Hypertension 10/12/2006   CARDIOMEGALY 10/12/2006   RHINITIS, ALLERGIC 10/12/2006   OSTEOARTHRITIS, LOWER LEG 10/12/2006   CERVICAL SPINE DISORDER, NOS 10/12/2006    PCP: Elenore Paddy, NP  REFERRING PROVIDER: Fuller Plan, MD  REFERRING DIAG: [phone] (ICD-10-CM) - Degeneration of intervertebral disc of lumbar region, unspecified whether pain present   Rationale for Evaluation and Treatment: Rehabilitation  THERAPY DIAG:  Other low back pain  Muscle weakness (generalized)  Other abnormalities of gait and mobility  PERTINENT HISTORY: HTN, tachycardia, Cardiomegaly, unstable angina, CAD, CKD2  WEIGHT BEARING RESTRICTIONS: No  FALLS:  Has patient fallen in last 6 months? Yes. 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 Pt stated that they  have maintained great compliance with current HEP.  Was able to walk around a store for a few minutes for the first time since the fall  Eval statement 11/14/2023: pt fell on Feb 28th, tripped on a gap between her garage floor and driveway, twisted her L ankle.no pain in L ankle currently. Fell on R knee, already is a bad knee and couldn't walk for a while, but was able to get  an injection that has helped R knee pain. Currently working with a Land, feels like its been helping . Difficulties with prolonged standing or walking, gets tired quickly. Pt reports 2/10 pain currently, gets worse when walking, feels like her body is tired and has weights around her. No n/t symptoms  RED FLAGS: None, Bowel or bladder incontinence: No, and Compression fracture: No    PLOF: Independent  PATIENT GOALS: get back pain better  NEXT MD VISIT: may ---------------------------------------------------------------------------------------------  OBJECTIVE:  Note: Objective measures were completed at Evaluation unless otherwise noted.  DIAGNOSTIC FINDINGS:  No recent imaging  PATIENT SURVEYS:  Modified Oswestry 21/50 (42%)   COGNITION: Overall cognitive status: Within functional limits for tasks assessed   PALPATION: No TTP  Lumbar contraction pattern  L Multifidus: Low quality contraction  R Multifidus:low quality contraction   SENSATION: WFL  MUSCLE LENGTH: Hamstrings: Right 0; deg; Left 0 deg  Thomas test: tight rec fem  POSTURE: increased lumbar lordosis   LUMBAR ROM:   AROM eval  Flexion 90%  Extension 30%  Right lateral flexion 60%  Left lateral flexion 60%  Right rotation 60%  Left rotation 60%   (Blank rows = not tested)  ! Indicates pain with testing  LOWER EXTREMITY ROM:     Active  Right eval Left eval  Hip flexion [facility]  Hip extension [facility]  Hip abduction    Hip adduction    Hip internal rotation    Hip external rotation    Knee flexion    Knee  extension    Ankle dorsiflexion    Ankle plantarflexion    Ankle inversion    Ankle eversion     (Blank rows = not tested)  !  Indicates pain with testing  LOWER EXTREMITY MMT:    MMT Right eval Left eval  Hip flexion 4- 4-  Hip extension    Hip abduction 4 4  Hip adduction    Hip internal rotation    Hip external rotation    Knee flexion 4+ 4+  Knee extension 4 4  Ankle dorsiflexion    Ankle plantarflexion    Ankle inversion    Ankle eversion     (Blank rows = not tested)   ! Indicates pain with testing  GAIT: Distance walked: 140ft Assistive device utilized: None Level of assistance: Complete Independence  [facility] Adult PT Treatment:                                                DATE: 11/23/2023  Therapeutic Exercise: Nustep L2  8 min Seated hamstring stretch w/APT 2x1' B QL stretch 2x1' B Neuromuscular re-ed: PPT  1x8, 10s hold  PPT with 90/90 marching 2x 10 ea. Therapeutic Activity: Supine hip fallouts GTB  1x 10 B,  Bridge against GTB w/ppt 2x8, 3s hold     [facility] Adult PT Treatment:                                                DATE: 11/21/23 Therapeutic Exercise: Nustep L2 8 min Seated hamstring stretch 30s x2 B QL stretch 30s x2 Neuromuscular re-ed: PPT 3s 10x PPT with alt LE march 10/10 Therapeutic Activity: Supine hip fallouts GTB 10x B, 10/10 unilaterally Bridge against GTB 10x  [facility] Adult PT Treatment:                                                DATE: 11/14/2023  Self Care: Pt education POC discussion                                                                                                                                PATIENT EDUCATION:  Education details: Pt received education regarding HEP performance, ADL performance, functional activity tolerance, impairment education, appropriate performance of therapeutic activities.  Person educated: Patient Education method: Explanation, Demonstration, Tactile cues, Verbal cues, and  Handouts Education comprehension: verbalized understanding and returned demonstration  HOME EXERCISE PROGRAM: Access Code: [facility] URL: [email or web address] Date: 11/14/2023 Prepared by: Sheliah Plane  Exercises - Supine Figure 4 core/lumbar mobility  - 1 x daily - 4-7 x weekly - 2 sets - 20 reps - Supine Lower Trunk Rotation  - 1 x daily - 7 x weekly - 3 sets - 12 reps -  2s hold - Clamshell with Resistance  - 1 x daily - 7 x weekly - 2-3 sets - 10 reps - 3s hold - Supine Bridge with Resistance Band  - 1 x daily - 7 x weekly - 3 sets - 8 reps - 3s hold ---------------------------------------------------------------------------------------------  ASSESSMENT:  CLINICAL IMPRESSION: Pt attended physical therapy session for continuation of treatment regarding LBP. Today's treatment focused on improvement of  lumbo-pelvic motility, core activation patterns and posterior chain strength/stability. Pt showed  great tolerance to administered treatment with no adverse effects by the end of session. Improvement was noted with all treatment focus points  by the end of today's session. Pt required minimal verbal/tactile cuing alongside no physical assistance for safe and appropriate performance of today's activities. Continue with therapeutic focus on lumbo-pelvic stabilization, dynamic core activation, and global hip strengthening.   Eval impression (11/14/2023): Pt. attended today's physical therapy session for evaluation of low back pain following a fall on feb 28th. Pt has complaints of 2/10 pain that gets worse with prolonged walking and standing. Pt has notable deficits with core activation patterns, global B hip strength, and lumbar AROM.   Pt would benefit from therapeutic focus on lumbo-pelvic stabilization, dynamic core activation, and global hip strengthening.  Treatment performed today focused on pt education detailed in obj. Pt demonstrated good understanding of education  provided. required moderate verbal/tactile cues and no physical assistance for appropriate performance with today's activities. Pt was unable to enter a prone position due to personal preference stating that "I just don't." Pt requires the intervention of skilled outpatient physical therapy to address the aforementioned deficits and progress towards a functional level in line with therapeutic goals.    OBJECTIVE IMPAIRMENTS: decreased activity tolerance, difficulty walking, decreased strength, impaired flexibility, improper body mechanics, and pain.   ACTIVITY LIMITATIONS: carrying, squatting, locomotion level, and caring for others  PARTICIPATION LIMITATIONS: driving, shopping, community activity, and occupation  PERSONAL FACTORS: Age, Behavior pattern, Fitness, Profession, and Time since onset of injury/illness/exacerbation are also affecting patient's functional outcome.   REHAB POTENTIAL: Good  CLINICAL DECISION MAKING: Stable/uncomplicated  EVALUATION COMPLEXITY: Low   GOALS: Goals reviewed with patient? YES  SHORT TERM GOALS: Target date: 12/05/2023  Pt will be independent with administered HEP to demonstrate the competency necessary for long term managemnet of symptoms at home. Baseline: Goal status: INITIAL  LONG TERM GOALS: Target date: 01/09/2024   Pt. Will achieve a MODI score of 15/50 (30%)  as to demonstrate improvement in self-perceived functional ability with daily activities.  Baseline: 21/50 (42%) Goal status: INITIAL  2.  Pt will improve Global hip strength to a 4+/5 to demonstrate improvement in strength for quality of motion and activity performance.  Baseline: see obj chart Goal status: INITIAL  3.  Pt will improve Lumbar AROM to 90% of standardized norms with less than 2/10 pain to demonstrate necessary mobility for high quality and safe ADLs  Baseline:  Goal status: INITIAL  4.  Pt will report the ability to stand for >/= 30 minutes as to demonstrate  improved tolerance to standing for prolonged time and improved ability to participate in occupational tasks.  Baseline:  Goal status: INITIAL   ---------------------------------------------------------------------------------------------  PLAN:  PT FREQUENCY: 2x/week  PT DURATION: 6 weeks  PLANNED INTERVENTIONS: 97110-Therapeutic exercises, 97530- Therapeutic activity, [id number]- Neuromuscular re-education, 97535- Self Care, [id number]- Manual therapy, [phone]- Gait training, [phone]- Aquatic Therapy, Patient/Family education, Balance training, Stair training, Joint mobilization, and Spinal mobilization.  PLAN FOR NEXT SESSION: Continue with  therapeutic focus on lumbo-pelvic stabilization, dynamic core activation, and global hip strengthening.   Sheliah Plane, PT, DPT 11/23/2023, 2:40 PM   For all possible CPT codes, reference the Planned Interventions line above.     Check all conditions that are expected to impact treatment: {Conditions expected to impact treatment:None of these apply   If treatment provided at initial evaluation, no treatment charged due to lack of authorization.

## 2023-11-23 NOTE — Telephone Encounter (Signed)
 Copied from CRM [id number]. Topic: Referral - Prior Authorization Question >> Nov 23, 2023 12:07 PM Cammy Copa D wrote: Reason for CRM: [employer]urs[facility] Party. [facility] checking status of Prior Authorization of Chest CT for tomorrow 04/11. Clinic did not receive documentation so request was not completed. Hurshel Party Stated patient will have to be reschedule at a different date.

## 2023-11-24 ENCOUNTER — Other Ambulatory Visit: Payer: Self-pay

## 2023-11-24 ENCOUNTER — Other Ambulatory Visit

## 2023-11-27 ENCOUNTER — Ambulatory Visit

## 2023-11-28 ENCOUNTER — Ambulatory Visit

## 2023-11-29 ENCOUNTER — Ambulatory Visit: Admitting: Physical Therapy

## 2023-11-30 ENCOUNTER — Encounter

## 2023-12-01 ENCOUNTER — Other Ambulatory Visit: Payer: Self-pay | Admitting: Cardiology

## 2023-12-01 DIAGNOSIS — I10 Essential (primary) hypertension: Secondary | ICD-10-CM

## 2023-12-01 DIAGNOSIS — I2510 Atherosclerotic heart disease of native coronary artery without angina pectoris: Secondary | ICD-10-CM

## 2023-12-01 DIAGNOSIS — R0602 Shortness of breath: Secondary | ICD-10-CM

## 2023-12-01 DIAGNOSIS — E785 Hyperlipidemia, unspecified: Secondary | ICD-10-CM

## 2023-12-05 NOTE — Therapy (Unsigned)
 OUTPATIENT PHYSICAL THERAPY TREATMENT NOTE   Patient Name: Carmen Brown MRN: [id number] DOB:October 27, 1940, 83 y.o., female Today's Date: 12/07/2023  END OF SESSION:  PT End of Session - 12/07/23 1609     Visit Number 4    Number of Visits 13    Date for PT Re-Evaluation 12/26/23    Authorization Type [employer]    PT Start Time 1615    PT Stop Time 1655    PT Time Calculation (min) 40 min    Activity Tolerance Patient tolerated treatment well    Behavior During Therapy WFL for tasks assessed/performed               Past Medical History:  Diagnosis Date   Allergy    Arthritis    CAD (coronary artery disease)    95% proximal LAD stenosis with DES.  Jan 2019   Cardiomegaly    Cardiomyopathy (HCC)    EF 35% in the distant past but NL EF 2009   Hypertension, benign    systemic   Menopausal syndrome    Obesity    Osteoarthritis of lower leg, localized    Paroxysmal VT (HCC)    Thyroid  disease    Past Surgical History:  Procedure Laterality Date   CARPAL TUNNEL RELEASE Right 11/2016   CATARACT EXTRACTION  2020   Per patient   LEFT HEART CATH AND CORONARY ANGIOGRAPHY N/A 09/08/2017   Procedure: LEFT HEART CATH AND CORONARY ANGIOGRAPHY;  Surgeon: Sammy Crisp, MD;  Location: [facility];  Service: Cardiovascular;  Laterality: N/A;   MASS EXCISION  03/15/2012   Procedure: MINOR EXCISION OF MASS;  Surgeon: Amelie Baize., MD;  Location: [facility];  Service: Orthopedics;  Laterality: Right;  debride IP right long finger, excision mucoid cyst   ventricular tacticartia     Patient Active Problem List   Diagnosis Date Noted   Osteoporosis screening 12/06/2023   Bilateral low back pain with bilateral sciatica 12/06/2023   Bradycardia 11/09/2023   Elevated ALT measurement 10/13/2023   Lymphadenopathy 09/14/2023   Acute pain of left shoulder 06/23/2023   Abnormal serum iron level 04/06/2023   Palpitations 04/06/2023   Restless leg syndrome  08/26/2022   Prediabetes 08/26/2022   Need for vaccination 05/20/2022   Snoring 05/20/2022   Stage 2 chronic kidney disease 02/11/2022   Chronic cough 12/31/2021   Colon cancer screening 01/22/2021   Idiopathic gout of multiple sites 05/21/2020   Generalized osteoarthritis 05/21/2020   Fatigue 11/25/2019   Hyperlipidemia 11/20/2017   CAD (coronary artery disease) 09/09/2017   Unstable angina (HCC) 09/07/2017   Osteoarthritis of left knee 06/08/2016   Osteoarthritis of right knee 06/08/2016   Degenerative disc disease, lumbar 04/06/2015   PAROXYSMAL VENTRICULAR TACHYCARDIA 08/21/2009   Obesity 10/12/2006   Hypertension 10/12/2006   CARDIOMEGALY 10/12/2006   RHINITIS, ALLERGIC 10/12/2006   OSTEOARTHRITIS, LOWER LEG 10/12/2006   CERVICAL SPINE DISORDER, NOS 10/12/2006    PCP: Zorita Hiss, NP  REFERRING PROVIDER: Matt Song, MD  REFERRING DIAG: [phone] (ICD-10-CM) - Degeneration of intervertebral disc of lumbar region, unspecified whether pain present   Rationale for Evaluation and Treatment: Rehabilitation  THERAPY DIAG:  Other low back pain  Muscle weakness (generalized)  Other abnormalities of gait and mobility  PERTINENT HISTORY: HTN, tachycardia, Cardiomegaly, unstable angina, CAD, CKD2  WEIGHT BEARING RESTRICTIONS: No  FALLS:  Has patient fallen in last 6 months? Yes. 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  Eval statement 11/14/2023: pt fell on Feb 28th, tripped on a gap between her garage floor and driveway, twisted her L ankle.no pain in L ankle currently. Fell on R knee, already is a bad knee and couldn't walk for a while, but was able to get  an injection that has helped R knee pain. Currently working with a Land, feels like its been helping . Difficulties with prolonged standing or walking, gets tired quickly. Pt reports 2/10 pain currently, gets worse when walking, feels like her body is tired and has weights around her. No n/t symptoms  RED FLAGS: None, Bowel or bladder incontinence: No, and Compression fracture: No    PLOF: Independent  PATIENT GOALS: get back pain better  NEXT MD VISIT: may ---------------------------------------------------------------------------------------------  OBJECTIVE:  Note: Objective measures were completed at Evaluation unless otherwise noted.  DIAGNOSTIC FINDINGS:  No recent imaging  PATIENT SURVEYS:  Modified Oswestry 21/50 (42%)   COGNITION: Overall cognitive status: Within functional limits for tasks assessed   PALPATION: No TTP  Lumbar contraction pattern  L Multifidus: Low quality contraction  R Multifidus:low quality contraction   SENSATION: WFL  MUSCLE LENGTH: Hamstrings: Right 0; deg; Left 0 deg  Thomas test: tight rec fem  POSTURE: increased lumbar lordosis   LUMBAR ROM:   AROM eval  Flexion 90%  Extension 30%  Right lateral flexion 60%  Left lateral flexion 60%  Right rotation 60%  Left rotation 60%   (Blank rows = not tested)  ! Indicates pain with testing  LOWER EXTREMITY ROM:     Active  Right eval Left eval  Hip flexion [facility]  Hip extension [facility]  Hip abduction    Hip adduction    Hip internal rotation    Hip external rotation    Knee flexion    Knee extension    Ankle dorsiflexion    Ankle plantarflexion    Ankle inversion    Ankle  eversion     (Blank rows = not tested)  ! Indicates pain with testing  LOWER EXTREMITY MMT:    MMT Right eval Left eval  Hip flexion 4-  4-  Hip extension    Hip abduction 4 4  Hip adduction    Hip internal rotation    Hip external rotation    Knee flexion 4+ 4+  Knee extension 4 4  Ankle dorsiflexion    Ankle plantarflexion    Ankle inversion    Ankle eversion     (Blank rows = not tested)   ! Indicates pain with testing  GAIT: Distance walked: 123ft Assistive device utilized: None Level of assistance: Complete Independence  [facility] Adult PT Treatment:                                                DATE: 12/07/23 Therapeutic Exercise: Nustep L3 8 min Seated hamstring stretch 30s x2 B QL stretch 30s x2 Neuromuscular re-ed: PPT 3s 10x PPT with alt LE march 10/10 PPT with alt UE flexion 10/10 Therapeutic Activity: Supine hip fallouts GTB 10x B, 10/10 unilaterally Bridge against GTB 10x S/L clams GTB 10/10 Bridge with ball 10x  [facility] Adult PT Treatment:                                                DATE: 11/23/2023  Therapeutic Exercise: Nustep L2  8 min Seated hamstring stretch w/APT 2x1' B QL stretch 2x1' B Neuromuscular re-ed: PPT  1x8, 10s hold  PPT with 90/90 marching 2x 10 ea. Therapeutic Activity: Supine hip fallouts GTB  1x 10 B,  Bridge against GTB w/ppt 2x8, 3s hold     [facility] Adult PT Treatment:                                                DATE: 11/21/23 Therapeutic Exercise: Nustep L2 8 min Seated hamstring stretch 30s x2 B QL stretch 30s x2 Neuromuscular re-ed: PPT 3s 10x PPT with alt LE march 10/10 Therapeutic Activity: Supine hip fallouts GTB 10x B, 10/10 unilaterally Bridge against GTB 10x  [facility] Adult PT Treatment:                                                DATE: 11/14/2023  Self Care: Pt education POC discussion                                                                                                                                 PATIENT EDUCATION:  Education details: Pt received education regarding HEP performance, ADL performance,  functional activity tolerance, impairment education, appropriate performance of therapeutic activities.  Person educated: Patient Education method: Explanation, Demonstration, Tactile cues, Verbal cues, and Handouts Education comprehension: verbalized understanding and returned demonstration  HOME EXERCISE PROGRAM: Access Code: [facility] URL: [email or web address] Date: 11/14/2023 Prepared by: Albesa Huguenin  Exercises - Supine Figure 4 core/lumbar mobility  - 1 x daily - 4-7 x weekly - 2 sets - 20 reps - Supine Lower Trunk Rotation  - 1 x daily - 7 x weekly - 3 sets - 12 reps - 2s hold - Clamshell with Resistance  - 1 x daily - 7 x weekly - 2-3 sets - 10 reps - 3s hold - Supine Bridge with Resistance Band  - 1 x daily - 7 x weekly - 3 sets - 8 reps - 3s hold ---------------------------------------------------------------------------------------------  ASSESSMENT:  CLINICAL IMPRESSION: Continued to address core strength deficits and mobility restrictions in lumbosacral spine. Incorporated additional lumbosacral strengthening activities which patient was able to complete w/o difficulty.  Eval impression (11/14/2023): Pt. attended today's physical therapy session for evaluation of low back pain following a fall on feb 28th. Pt has complaints of 2/10 pain that gets worse with prolonged walking and standing. Pt has notable deficits with core activation patterns, global B hip strength, and lumbar AROM.   Pt would benefit from therapeutic focus on lumbo-pelvic stabilization, dynamic core activation, and global hip strengthening.  Treatment performed today focused on pt education detailed in obj. Pt demonstrated good understanding of education provided. required moderate verbal/tactile cues and no physical assistance for appropriate performance with today's activities. Pt  was unable to enter a prone position due to personal preference stating that "I just don't." Pt requires the intervention of skilled outpatient physical therapy to address the aforementioned deficits and progress towards a functional level in line with therapeutic goals.    OBJECTIVE IMPAIRMENTS: decreased activity tolerance, difficulty walking, decreased strength, impaired flexibility, improper body mechanics, and pain.   ACTIVITY LIMITATIONS: carrying, squatting, locomotion level, and caring for others  PARTICIPATION LIMITATIONS: driving, shopping, community activity, and occupation  PERSONAL FACTORS: Age, Behavior pattern, Fitness, Profession, and Time since onset of injury/illness/exacerbation are also affecting patient's functional outcome.   REHAB POTENTIAL: Good  CLINICAL DECISION MAKING: Stable/uncomplicated  EVALUATION COMPLEXITY: Low   GOALS: Goals reviewed with patient? YES  SHORT TERM GOALS: Target date: 12/05/2023  Pt will be independent with administered HEP to demonstrate the competency necessary for long term managemnet of symptoms at home. Baseline: Goal status: Reports compliance  LONG TERM GOALS: Target date: 01/09/2024   Pt. Will achieve a MODI score of 15/50 (30%)  as to demonstrate improvement in self-perceived functional ability with daily activities.  Baseline: 21/50 (42%) Goal status: INITIAL  2.  Pt will improve Global hip strength to a 4+/5 to demonstrate improvement in strength for quality of motion and activity performance.  Baseline: see obj chart Goal status: INITIAL  3.  Pt will improve Lumbar AROM to 90% of standardized norms with less than 2/10 pain to demonstrate necessary mobility for high quality and safe ADLs  Baseline:  Goal status: INITIAL  4.  Pt will report the ability to stand for >/= 30 minutes as to demonstrate improved tolerance to standing for prolonged time and improved ability to participate in occupational tasks.  Baseline:   Goal status: INITIAL   ---------------------------------------------------------------------------------------------  PLAN:  PT FREQUENCY: 2x/week  PT DURATION: 6 weeks  PLANNED INTERVENTIONS: 97110-Therapeutic exercises, 97530- Therapeutic activity, [id number]- Neuromuscular re-education, 97535- Self Care, 16109- Manual  therapy, [id number]- Gait training, 43329- Aquatic Therapy, Patient/Family education, Balance training, Stair training, Joint mobilization, and Spinal mobilization.  PLAN FOR NEXT SESSION: Continue with therapeutic focus on lumbo-pelvic stabilization, dynamic core activation, and global hip strengthening.   Albesa Huguenin, PT, DPT 12/07/2023, 5:00 PM   For all possible CPT codes, reference the Planned Interventions line above.     Check all conditions that are expected to impact treatment: {Conditions expected to impact treatment:None of these apply   If treatment provided at initial evaluation, no treatment charged due to lack of authorization.

## 2023-12-06 ENCOUNTER — Ambulatory Visit: Admitting: Nurse Practitioner

## 2023-12-06 ENCOUNTER — Telehealth: Payer: Self-pay | Admitting: Nurse Practitioner

## 2023-12-06 VITALS — BP 148/90 | HR 83 | Temp 98.2°F | Ht 64.5 in | Wt 221.2 lb

## 2023-12-06 DIAGNOSIS — M5441 Lumbago with sciatica, right side: Secondary | ICD-10-CM | POA: Diagnosis not present

## 2023-12-06 DIAGNOSIS — R591 Generalized enlarged lymph nodes: Secondary | ICD-10-CM

## 2023-12-06 DIAGNOSIS — Z13820 Encounter for screening for osteoporosis: Secondary | ICD-10-CM | POA: Diagnosis not present

## 2023-12-06 DIAGNOSIS — M5442 Lumbago with sciatica, left side: Secondary | ICD-10-CM

## 2023-12-06 MED ORDER — METHOCARBAMOL 500 MG PO TABS: 500.0000 mg | ORAL_TABLET | Freq: Three times a day (TID) | ORAL | 1 refills | Status: DC | PRN

## 2023-12-06 NOTE — Assessment & Plan Note (Signed)
 Etiology unclear Discussed risk versus benefits of undergoing repeat CT scan.  She would like to move forward with CT scan. Will reorder CT scan and specifically request that prior authorization be completed.  Further recommendations to be made based upon CT scan results.

## 2023-12-06 NOTE — Assessment & Plan Note (Signed)
 DEXA scan ordered to evaluate bone density.

## 2023-12-06 NOTE — Assessment & Plan Note (Addendum)
 Subacute Referral to neurosurgery ordered today for further assistance with evaluating her back pain No red flag symptoms on exam to warrant imaging today. Encourage patient to follow-up with physical therapy as well. Will trial methocarbamol  500 mg tablet, take 1 tablet by mouth every 8 hours as needed for pain.  Patient advised to avoid driving or operating heavy machinery after taking medication.

## 2023-12-06 NOTE — Telephone Encounter (Signed)
 Please request the appropriate person to initiate prior authorization with insurance company for CT of chest w contrast for follow-up on enlarged lymph node in the chest from 05/2023

## 2023-12-06 NOTE — Progress Notes (Signed)
 Established Patient Office Visit  Subjective   Patient ID: Carmen Brown, female    DOB: 05-12-1941  Age: 83 y.o. MRN: [id number]  Chief Complaint  Patient presents with   Back Pain    Back pain: Patient had a fall in February 2025.  Since then she has been experiencing low back pain.  She has been following with chiropractor and undergoing massage as well as working physical therapy.  She is only completed 2 physical therapy sessions.  Reports that the pain radiates around her hips and down her legs but is moderate in intensity.  She denies any sensory changes, weakness to her legs, or new bowel/bladder incontinence.  She would like to see neurosurgery to determine whether or not she would benefit from injections.    Lymphadenopathy: Incidentally found enlarged lymph node on CT scan from 06/04/2023.  I had ordered follow-up scan in March, but prior authorization was never completed and then scan was canceled.  She reports no change or worsening fatigue, fever, night sweats.    ROS: see HPI    Objective:     BP (!) 148/90   Pulse 83   Temp 98.2 F (36.8 C) (Temporal)   Ht 5' 4.5" (1.638 m)   Wt 221 lb 4 oz (100.4 kg)   SpO2 94%   BMI 37.39 kg/m  BP Readings from Last 3 Encounters:  12/06/23 (!) 148/90  11/10/23 (!) 144/82  11/03/23 115/78   Wt Readings from Last 3 Encounters:  12/06/23 221 lb 4 oz (100.4 kg)  11/10/23 223 lb (101.2 kg)  11/03/23 219 lb (99.3 kg)      Physical Exam Vitals reviewed.  Constitutional:      General: She is not in acute distress.    Appearance: Normal appearance.  HENT:     Head: Normocephalic and atraumatic.  Cardiovascular:     Rate and Rhythm: Normal rate and regular rhythm.     Pulses: Normal pulses.     Heart sounds: Normal heart sounds.  Pulmonary:     Effort: Pulmonary effort is normal.     Breath sounds: Normal breath sounds.  Musculoskeletal:     Lumbar back: Normal. Negative right straight leg raise test and negative  left straight leg raise test.  Skin:    General: Skin is warm and dry.  Neurological:     General: No focal deficit present.     Mental Status: She is alert and oriented to person, place, and time.     Sensory: Sensation is intact.     Motor: Motor function is intact.     Gait: Gait is intact.  Psychiatric:        Mood and Affect: Mood normal.        Behavior: Behavior normal.        Judgment: Judgment normal.      No results found for any visits on 12/06/23.    The ASCVD Risk score (Arnett DK, et al., 2019) failed to calculate for the following reasons:   The 2019 ASCVD risk score is only valid for ages 66 to 22   Risk score cannot be calculated because patient has a medical history suggesting prior/existing ASCVD    Assessment & Plan:   Problem List Items Addressed This Visit       Nervous and Auditory   Bilateral low back pain with bilateral sciatica   Subacute Referral to neurosurgery ordered today for further assistance with evaluating her back pain No red flag  symptoms on exam to warrant imaging today. Encourage patient to follow-up with physical therapy as well. Will trial methocarbamol  500 mg tablet, take 1 tablet by mouth every 8 hours as needed for pain.  Patient advised to avoid driving or operating heavy machinery after taking medication.      Relevant Medications   methocarbamol  (ROBAXIN ) 500 MG tablet   Other Relevant Orders   Ambulatory referral to Neurosurgery     Immune and Lymphatic   Lymphadenopathy - Primary   Etiology unclear Discussed risk versus benefits of undergoing repeat CT scan.  She would like to move forward with CT scan. Will reorder CT scan and specifically request that prior authorization be completed.  Further recommendations to be made based upon CT scan results.      Relevant Orders   CT Chest W Contrast     Other   Osteoporosis screening   DEXA scan ordered to evaluate bone density.      Relevant Orders   DG Bone  Density    Return in about 1 month (around 01/05/2024) for F/U with Sofiya Ezelle.    Zorita Hiss, NP

## 2023-12-07 ENCOUNTER — Ambulatory Visit

## 2023-12-07 DIAGNOSIS — M5459 Other low back pain: Secondary | ICD-10-CM | POA: Diagnosis not present

## 2023-12-07 DIAGNOSIS — M6281 Muscle weakness (generalized): Secondary | ICD-10-CM

## 2023-12-07 DIAGNOSIS — R2689 Other abnormalities of gait and mobility: Secondary | ICD-10-CM

## 2023-12-07 NOTE — Telephone Encounter (Signed)
 Copied from CRM [id number]. Topic: Referral - Question >> Dec 07, 2023  9:09 AM Lizabeth Riggs wrote: Reason for CRM:  Tanya Fantasia with [facility] needs prior authorization for a CT chest before they can schedule appointment. Please call her at [phone].

## 2023-12-14 ENCOUNTER — Telehealth: Payer: Self-pay

## 2023-12-14 NOTE — Telephone Encounter (Signed)
 Copied from CRM [id number]. Topic: Appointments - Scheduling Inquiry for Clinic >> Dec 14, 2023  9:41 AM Carmen Brown wrote: Reason for CRM: Patient is wanting to schedule both of her CT scans for her chest.

## 2023-12-19 ENCOUNTER — Ambulatory Visit: Attending: Internal Medicine | Admitting: Physical Therapy

## 2023-12-19 ENCOUNTER — Encounter: Payer: Self-pay | Admitting: Physical Therapy

## 2023-12-19 DIAGNOSIS — M6281 Muscle weakness (generalized): Secondary | ICD-10-CM | POA: Diagnosis present

## 2023-12-19 DIAGNOSIS — R2689 Other abnormalities of gait and mobility: Secondary | ICD-10-CM | POA: Insufficient documentation

## 2023-12-19 DIAGNOSIS — M5459 Other low back pain: Secondary | ICD-10-CM | POA: Diagnosis present

## 2023-12-19 NOTE — Therapy (Signed)
 OUTPATIENT PHYSICAL THERAPY TREATMENT NOTE   Patient Name: Carmen Brown MRN: [id number] DOB:[birth year], 83 y.o., female Today's Date: 12/19/2023  END OF SESSION:  PT End of Session - 12/19/23 1512     Visit Number 5    Number of Visits 13    Date for PT Re-Evaluation 12/26/23    Authorization Type [employer]    PT Start Time 1445    PT Stop Time 1525    PT Time Calculation (min) 40 min    Activity Tolerance Patient tolerated treatment well    Behavior During Therapy WFL for tasks assessed/performed                Past Medical History:  Diagnosis Date   Allergy    Arthritis    CAD (coronary artery disease)    95% proximal LAD stenosis with DES.  Jan 2019   Cardiomegaly    Cardiomyopathy (HCC)    EF 35% in the distant past but NL EF 2009   Hypertension, benign    systemic   Menopausal syndrome    Obesity    Osteoarthritis of lower leg, localized    Paroxysmal VT (HCC)    Thyroid  disease    Past Surgical History:  Procedure Laterality Date   CARPAL TUNNEL RELEASE Right 11/2016   CATARACT EXTRACTION  2020   Per patient   LEFT HEART CATH AND CORONARY ANGIOGRAPHY N/A 09/08/2017   Procedure: LEFT HEART CATH AND CORONARY ANGIOGRAPHY;  Surgeon: Sammy Crisp, MD;  Location: [facility];  Service: Cardiovascular;  Laterality: N/A;   MASS EXCISION  03/15/2012   Procedure: MINOR EXCISION OF MASS;  Surgeon: Amelie Baize., MD;  Location: [facility];  Service: Orthopedics;  Laterality: Right;  debride IP right long finger, excision mucoid cyst   ventricular tacticartia     Patient Active Problem List   Diagnosis Date Noted   Osteoporosis screening 12/06/2023   Bilateral low back pain with bilateral sciatica 12/06/2023   Bradycardia 11/09/2023   Elevated ALT measurement 10/13/2023   Lymphadenopathy 09/14/2023   Acute pain of left shoulder 06/23/2023   Abnormal serum iron level 04/06/2023   Palpitations 04/06/2023   Restless leg syndrome  08/26/2022   Prediabetes 08/26/2022   Need for vaccination 05/20/2022   Snoring 05/20/2022   Stage 2 chronic kidney disease 02/11/2022   Chronic cough 12/31/2021   Colon cancer screening 01/22/2021   Idiopathic gout of multiple sites 05/21/2020   Generalized osteoarthritis 05/21/2020   Fatigue 11/25/2019   Hyperlipidemia 11/20/2017   CAD (coronary artery disease) 09/09/2017   Unstable angina (HCC) 09/07/2017   Osteoarthritis of left knee 06/08/2016   Osteoarthritis of right knee 06/08/2016   Degenerative disc disease, lumbar 04/06/2015   PAROXYSMAL VENTRICULAR TACHYCARDIA 08/21/2009   Obesity 10/12/2006   Hypertension 10/12/2006   CARDIOMEGALY 10/12/2006   RHINITIS, ALLERGIC 10/12/2006   OSTEOARTHRITIS, LOWER LEG 10/12/2006   CERVICAL SPINE DISORDER, NOS 10/12/2006    PCP: Zorita Hiss, NP  REFERRING PROVIDER: Matt Song, MD  REFERRING DIAG: [phone] (ICD-10-CM) - Degeneration of intervertebral disc of lumbar region, unspecified whether pain present   Rationale for Evaluation and Treatment: Rehabilitation  THERAPY DIAG:  Other low back pain  Muscle weakness (generalized)  Other abnormalities of gait and mobility  PERTINENT HISTORY: HTN, tachycardia, Cardiomegaly, unstable angina, CAD, CKD2  WEIGHT BEARING RESTRICTIONS: No  FALLS:  Has patient fallen in last 6 months? Yes. 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 Pt stated that they have maintained poor compliance with current HEP.     Eval statement 11/14/2023: pt fell on Feb 28th, tripped on a gap between her garage floor and driveway, twisted her L ankle.no pain in L ankle currently. Fell on R knee, already is a bad knee and couldn't walk for a while, but was able to get  an injection that has helped R knee pain. Currently working with a Land, feels like its been helping . Difficulties with prolonged standing or walking, gets tired quickly. Pt reports 2/10 pain currently, gets worse when walking, feels like her body is tired and has weights around her. No n/t symptoms  RED FLAGS: None, Bowel or bladder incontinence: No, and Compression fracture: No    PLOF: Independent  PATIENT GOALS: get back pain better  NEXT MD VISIT: may ---------------------------------------------------------------------------------------------  OBJECTIVE:  Note: Objective measures were completed at Evaluation unless otherwise noted.  DIAGNOSTIC FINDINGS:  No recent imaging  PATIENT SURVEYS:  Modified Oswestry 21/50 (42%)   COGNITION: Overall cognitive status: Within functional limits for tasks assessed   PALPATION: No TTP  Lumbar contraction pattern  L Multifidus: Low quality contraction  R Multifidus:low quality contraction   SENSATION: WFL  MUSCLE LENGTH: Hamstrings: Right 0; deg; Left 0 deg  Thomas test: tight rec fem  POSTURE: increased lumbar lordosis   LUMBAR ROM:   AROM eval  Flexion 90%  Extension 30%  Right lateral flexion 60%  Left lateral flexion 60%  Right rotation 60%  Left rotation 60%   (Blank rows = not tested)  ! Indicates pain with testing  LOWER EXTREMITY ROM:     Active  Right eval Left eval  Hip flexion [facility]  Hip extension [facility]  Hip abduction    Hip adduction    Hip internal rotation    Hip external rotation    Knee flexion    Knee  extension    Ankle dorsiflexion    Ankle plantarflexion    Ankle inversion    Ankle eversion     (Blank rows = not tested)  ! Indicates pain with  testing  LOWER EXTREMITY MMT:    MMT Right eval Left eval  Hip flexion 4- 4-  Hip extension    Hip abduction 4 4  Hip adduction    Hip internal rotation    Hip external rotation    Knee flexion 4+ 4+  Knee extension 4 4  Ankle dorsiflexion    Ankle plantarflexion    Ankle inversion    Ankle eversion     (Blank rows = not tested)   ! Indicates pain with testing  GAIT: Distance walked: 116ft Assistive device utilized: None Level of assistance: Complete Independence  [facility] Adult PT Treatment:                                                DATE: 12/19/2023  Therapeutic Exercise: NuStep 8' Seated hamstring stretch w/APT Neuromuscular re-ed: 90/90 hold 3x30s Pball curl 2x12, hold for one breath core activation practice with deep breathing 1x10 Cough cue Therapeutic activity Supine hip fallouts  1x10B, GTB Bridge against 2x10, hold 5s GTB   [facility] Adult PT Treatment:                                                DATE: 12/07/23 Therapeutic Exercise: Nustep L3 8 min Seated hamstring stretch 30s x2 B QL stretch 30s x2 Neuromuscular re-ed: PPT 3s 10x PPT with alt LE march 10/10 PPT with alt UE flexion 10/10 Therapeutic Activity: Supine hip fallouts GTB 10x B, 10/10 unilaterally Bridge against GTB 10x S/L clams GTB 10/10 Bridge with ball 10x                                                                                                                       PATIENT EDUCATION:  Education details: Pt received education regarding HEP performance, ADL performance, functional activity tolerance, impairment education, appropriate performance of therapeutic activities.  Person educated: Patient Education method: Explanation, Demonstration, Tactile cues, Verbal cues, and Handouts Education comprehension: verbalized  understanding and returned demonstration  HOME EXERCISE PROGRAM: Access Code: [facility] URL: [email or web address] Date: 11/14/2023 Prepared by: Albesa Huguenin  Exercises - Supine Figure 4 core/lumbar mobility  - 1 x daily - 4-7 x weekly - 2 sets - 20 reps - Supine Lower Trunk Rotation  - 1 x daily - 7 x weekly - 3 sets - 12 reps - 2s hold - Clamshell with Resistance  - 1 x daily - 7 x weekly - 2-3 sets - 10 reps - 3s hold - Supine Bridge with Resistance Band  - 1 x daily - 7 x weekly - 3 sets - 8 reps - 3s hold ---------------------------------------------------------------------------------------------  ASSESSMENT:  CLINICAL IMPRESSION:  Pt attended physical therapy session for continuation of treatment regarding low back  pain. Today's treatment focused on improvement of  posterior chain stability/motility and strength . Pt showed good tolerance to administered treatment with no adverse effects by the end of session. Pt required moderate verbal/tactile cuing alongside no physical assistance for safe and appropriate performance of today's activities. Needed consistent cuing to breath during exercise. Continue with therapeutic focus on dynamic core activities, posterior chain stability/strength/ and motility.   Eval impression (11/14/2023): Pt. attended today's physical therapy session for evaluation of low back pain following a fall on feb 28th. Pt has complaints of 2/10 pain that gets worse with prolonged walking and standing. Pt has notable deficits with core activation patterns, global B hip strength, and lumbar AROM.   Pt would benefit from therapeutic focus on lumbo-pelvic stabilization, dynamic core activation, and global hip strengthening.  Treatment performed today focused on pt education detailed in obj. Pt demonstrated good understanding of education provided. required moderate verbal/tactile cues and no physical assistance for appropriate performance with today's  activities. Pt was unable to enter a prone position due to personal preference stating that "I just don't." Pt requires the intervention of skilled outpatient physical therapy to address the aforementioned deficits and progress towards a functional level in line with therapeutic goals.    OBJECTIVE IMPAIRMENTS: decreased activity tolerance, difficulty walking, decreased strength, impaired flexibility, improper body mechanics, and pain.   ACTIVITY LIMITATIONS: carrying, squatting, locomotion level, and caring for others  PARTICIPATION LIMITATIONS: driving, shopping, community activity, and occupation  PERSONAL FACTORS: Age, Behavior pattern, Fitness, Profession, and Time since onset of injury/illness/exacerbation are also affecting patient's functional outcome.   REHAB POTENTIAL: Good  CLINICAL DECISION MAKING: Stable/uncomplicated  EVALUATION COMPLEXITY: Low   GOALS: Goals reviewed with patient? YES  SHORT TERM GOALS: Target date: 12/05/2023  Pt will be independent with administered HEP to demonstrate the competency necessary for long term managemnet of symptoms at home. Baseline: Goal status: Reports compliance  LONG TERM GOALS: Target date: 01/09/2024   Pt. Will achieve a MODI score of 15/50 (30%)  as to demonstrate improvement in self-perceived functional ability with daily activities.  Baseline: 21/50 (42%) Goal status: INITIAL  2.  Pt will improve Global hip strength to a 4+/5 to demonstrate improvement in strength for quality of motion and activity performance.  Baseline: see obj chart Goal status: INITIAL  3.  Pt will improve Lumbar AROM to 90% of standardized norms with less than 2/10 pain to demonstrate necessary mobility for high quality and safe ADLs  Baseline:  Goal status: INITIAL  4.  Pt will report the ability to stand for >/= 30 minutes as to demonstrate improved tolerance to standing for prolonged time and improved ability to participate in occupational  tasks.  Baseline:  Goal status: INITIAL   ---------------------------------------------------------------------------------------------  PLAN:  PT FREQUENCY: 2x/week  PT DURATION: 6 weeks  PLANNED INTERVENTIONS: 97110-Therapeutic exercises, 97530- Therapeutic activity, [id number]- Neuromuscular re-education, 97535- Self Care, [id number]- Manual therapy, [phone]- Gait training, [phone]- Aquatic Therapy, Patient/Family education, Balance training, Stair training, Joint mobilization, and Spinal mobilization.  PLAN FOR NEXT SESSION: Continue with therapeutic focus on lumbo-pelvic stabilization, dynamic core activation, and global hip strengthening.   Albesa Huguenin, PT, DPT 12/19/2023, 3:25 PM   For all possible CPT codes, reference the Planned Interventions line above.     Check all conditions that are expected to impact treatment: {Conditions expected to impact treatment:None of these apply   If treatment provided at initial evaluation, no treatment charged due to lack of authorization.

## 2023-12-21 ENCOUNTER — Ambulatory Visit: Admitting: Physical Therapy

## 2023-12-21 DIAGNOSIS — M5459 Other low back pain: Secondary | ICD-10-CM

## 2023-12-21 DIAGNOSIS — M6281 Muscle weakness (generalized): Secondary | ICD-10-CM

## 2023-12-21 DIAGNOSIS — R2689 Other abnormalities of gait and mobility: Secondary | ICD-10-CM

## 2023-12-21 NOTE — Therapy (Signed)
 OUTPATIENT PHYSICAL THERAPY TREATMENT NOTE   Patient Name: Carmen Brown MRN: [id number] DOB:1941/04/19, 83 y.o., female Today's Date: 12/21/2023  END OF SESSION:  PT End of Session - 12/21/23 1510     Visit Number 6    Number of Visits 13    Date for PT Re-Evaluation 12/26/23    Authorization Type [employer]    PT Start Time 1445    PT Stop Time 1525    PT Time Calculation (min) 40 min    Activity Tolerance Patient tolerated treatment well    Behavior During Therapy WFL for tasks assessed/performed                 Past Medical History:  Diagnosis Date   Allergy    Arthritis    CAD (coronary artery disease)    95% proximal LAD stenosis with DES.  Jan 2019   Cardiomegaly    Cardiomyopathy (HCC)    EF 35% in the distant past but NL EF 2009   Hypertension, benign    systemic   Menopausal syndrome    Obesity    Osteoarthritis of lower leg, localized    Paroxysmal VT (HCC)    Thyroid  disease    Past Surgical History:  Procedure Laterality Date   CARPAL TUNNEL RELEASE Right 11/2016   CATARACT EXTRACTION  2020   Per patient   LEFT HEART CATH AND CORONARY ANGIOGRAPHY N/A 09/08/2017   Procedure: LEFT HEART CATH AND CORONARY ANGIOGRAPHY;  Surgeon: Sammy Crisp, MD;  Location: [facility];  Service: Cardiovascular;  Laterality: N/A;   MASS EXCISION  03/15/2012   Procedure: MINOR EXCISION OF MASS;  Surgeon: Amelie Baize., MD;  Location: [facility];  Service: Orthopedics;  Laterality: Right;  debride IP right long finger, excision mucoid cyst   ventricular tacticartia     Patient Active Problem List   Diagnosis Date Noted   Osteoporosis screening 12/06/2023   Bilateral low back pain with bilateral sciatica 12/06/2023   Bradycardia 11/09/2023   Elevated ALT measurement 10/13/2023   Lymphadenopathy 09/14/2023   Acute pain of left shoulder 06/23/2023   Abnormal serum iron level 04/06/2023   Palpitations 04/06/2023   Restless leg syndrome  08/26/2022   Prediabetes 08/26/2022   Need for vaccination 05/20/2022   Snoring 05/20/2022   Stage 2 chronic kidney disease 02/11/2022   Chronic cough 12/31/2021   Colon cancer screening 01/22/2021   Idiopathic gout of multiple sites 05/21/2020   Generalized osteoarthritis 05/21/2020   Fatigue 11/25/2019   Hyperlipidemia 11/20/2017   CAD (coronary artery disease) 09/09/2017   Unstable angina (HCC) 09/07/2017   Osteoarthritis of left knee 06/08/2016   Osteoarthritis of right knee 06/08/2016   Degenerative disc disease, lumbar 04/06/2015   PAROXYSMAL VENTRICULAR TACHYCARDIA 08/21/2009   Obesity 10/12/2006   Hypertension 10/12/2006   CARDIOMEGALY 10/12/2006   RHINITIS, ALLERGIC 10/12/2006   OSTEOARTHRITIS, LOWER LEG 10/12/2006   CERVICAL SPINE DISORDER, NOS 10/12/2006    PCP: Zorita Hiss, NP  REFERRING PROVIDER: Matt Song, MD  REFERRING DIAG: [phone] (ICD-10-CM) - Degeneration of intervertebral disc of lumbar region, unspecified whether pain present   Rationale for Evaluation and Treatment: Rehabilitation  THERAPY DIAG:  Other low back pain  Muscle weakness (generalized)  Other abnormalities of gait and mobility  PERTINENT HISTORY: HTN, tachycardia, Cardiomegaly, unstable angina, CAD, CKD2  WEIGHT BEARING RESTRICTIONS: No  FALLS:  Has patient fallen in last 6 months? Yes. 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   PRECAUTIONS: None ---------------------------------------------------------------------------------------------  SUBJECTIVE:                                                                                                                                                                                           SUBJECTIVE STATEMENT: Pt attended today's session  with reports  of no pain when siting, just a discomfort when performing activitiyes such as walking, standing, and making the bed in the morning    Eval statement 11/14/2023: pt fell on Feb 28th, tripped on a gap between her garage floor and driveway, twisted her L ankle.no pain in L ankle currently. Fell on R knee, already is a bad knee and couldn't walk for a while, but was able to get  an injection that has helped R knee pain. Currently working with a Land, feels like its been helping . Difficulties with prolonged standing or walking, gets tired quickly. Pt reports 2/10 pain currently, gets worse when walking, feels like her body is tired and has weights around her. No n/t symptoms  RED FLAGS: None, Bowel or bladder incontinence: No, and Compression fracture: No    PLOF: Independent  PATIENT GOALS: get back pain better  NEXT MD VISIT: may 23 ---------------------------------------------------------------------------------------------  OBJECTIVE:  Note: Objective measures were completed at Evaluation unless otherwise noted.  DIAGNOSTIC FINDINGS:  No recent imaging  PATIENT SURVEYS:  Modified Oswestry 21/50 (42%)   COGNITION: Overall cognitive status: Within functional limits for tasks assessed   PALPATION: No TTP  Lumbar contraction pattern  L Multifidus: Low quality contraction  R Multifidus:low quality contraction   SENSATION: WFL  MUSCLE LENGTH: Hamstrings: Right 0; deg; Left 0 deg  Thomas test: tight rec fem  POSTURE: increased lumbar lordosis   LUMBAR ROM:   AROM eval  Flexion 90%  Extension 30%  Right lateral flexion 60%  Left lateral flexion 60%  Right rotation 60%  Left rotation 60%   (Blank rows = not tested)  ! Indicates pain with testing  LOWER EXTREMITY ROM:     Active  Right eval Left eval  Hip flexion [facility]  Hip extension [facility]  Hip abduction    Hip adduction    Hip internal rotation    Hip external rotation    Knee  flexion    Knee extension    Ankle dorsiflexion    Ankle plantarflexion    Ankle inversion    Ankle eversion     (Blank rows = not  tested)  ! Indicates pain with testing  LOWER EXTREMITY MMT:    MMT Right eval Left eval  Hip flexion 4- 4-  Hip extension    Hip abduction 4 4  Hip adduction    Hip internal rotation    Hip external rotation    Knee flexion 4+ 4+  Knee extension 4 4  Ankle dorsiflexion    Ankle plantarflexion    Ankle inversion    Ankle eversion     (Blank rows = not tested)   ! Indicates pain with testing  GAIT: Distance walked: 120ft Assistive device utilized: None Level of assistance: Complete Independence  [facility] Adult PT Treatment:                                                DATE: 12/19/2023  Therapeutic Exercise: NuStep 8' Seated hamstring stretch w/APT 2x1' Neuromuscular re-ed: 90/90 hold 3x30s Pball curl 2x12, hold for one breath Therapeutic activity Supine hip fallouts  1x10B, GTB Bridge against 2x10, hold 5s GTB   [facility] Adult PT Treatment:                                                DATE: 12/19/2023  Therapeutic Exercise: NuStep 8' Seated hamstring stretch w/APT Neuromuscular re-ed: 90/90 hold 3x30s Pball curl 2x12, hold for one breath core activation practice with deep breathing 1x10 Cough cue Therapeutic activity Supine hip fallouts  2x10B, hold 3s GTB Bridge against 2x10, hold 5s GTB                                                                  PATIENT EDUCATION:  Education details: Pt received education regarding HEP performance, ADL performance, functional activity tolerance, impairment education, appropriate performance of therapeutic activities.  Person educated: Patient Education method: Explanation, Demonstration, Tactile cues, Verbal cues, and Handouts Education comprehension: verbalized understanding and returned demonstration  HOME EXERCISE PROGRAM: Access Code: [facility] URL:  [email or web address] Date: 11/14/2023 Prepared by: Albesa Huguenin  Exercises - Supine Figure 4 core/lumbar mobility  - 1 x daily - 4-7 x weekly - 2 sets - 20 reps - Supine Lower Trunk Rotation  - 1 x daily - 7 x weekly - 3 sets - 12 reps - 2s hold - Clamshell with Resistance  - 1 x daily - 7 x weekly - 2-3 sets - 10 reps - 3s hold - Supine Bridge with Resistance Band  - 1 x daily - 7 x weekly - 3 sets - 8 reps - 3s hold ---------------------------------------------------------------------------------------------  ASSESSMENT:  CLINICAL IMPRESSION:  Pt attended physical therapy session for continuation of treatment regarding low back pain. Today's treatment focused on improvement of  posterior chain stability/motility and strength. Pt showed  great tolerance to administered treatment with no adverse effects by the end of session. Pt was only minimally sore from last session and gave reports of significantly improved general function with no change in symptoms, begin to consider referral back to PCP for further pain management options.Pt required  minimal verbal/tactile cuing alongside no physical assistance for safe and appropriate performance of today's activities. Continue with therapeutic focus on Continue with therapeutic focus on dynamic core activities, posterior chain stability/strength/ and motility..   Eval impression (11/14/2023): Pt. attended today's physical therapy session for evaluation of low back pain following a fall on feb 28th. Pt has complaints of 2/10 pain that gets worse with prolonged walking and standing. Pt has notable deficits with core activation patterns, global B hip strength, and lumbar AROM.   Pt would benefit from therapeutic focus on lumbo-pelvic stabilization, dynamic core activation, and global hip strengthening.  Treatment performed today focused on pt education detailed in obj. Pt demonstrated good understanding of education provided. required moderate  verbal/tactile cues and no physical assistance for appropriate performance with today's activities. Pt was unable to enter a prone position due to personal preference stating that "I just don't." Pt requires the intervention of skilled outpatient physical therapy to address the aforementioned deficits and progress towards a functional level in line with therapeutic goals.    OBJECTIVE IMPAIRMENTS: decreased activity tolerance, difficulty walking, decreased strength, impaired flexibility, improper body mechanics, and pain.   ACTIVITY LIMITATIONS: carrying, squatting, locomotion level, and caring for others  PARTICIPATION LIMITATIONS: driving, shopping, community activity, and occupation  PERSONAL FACTORS: Age, Behavior pattern, Fitness, Profession, and Time since onset of injury/illness/exacerbation are also affecting patient's functional outcome.   REHAB POTENTIAL: Good  CLINICAL DECISION MAKING: Stable/uncomplicated  EVALUATION COMPLEXITY: Low   GOALS: Goals reviewed with patient? YES  SHORT TERM GOALS: Target date: 12/05/2023  Pt will be independent with administered HEP to demonstrate the competency necessary for long term managemnet of symptoms at home. Baseline: Goal status: Reports compliance  LONG TERM GOALS: Target date: 01/09/2024   Pt. Will achieve a MODI score of 15/50 (30%)  as to demonstrate improvement in self-perceived functional ability with daily activities.  Baseline: 21/50 (42%) Goal status: INITIAL  2.  Pt will improve Global hip strength to a 4+/5 to demonstrate improvement in strength for quality of motion and activity performance.  Baseline: see obj chart Goal status: INITIAL  3.  Pt will improve Lumbar AROM to 90% of standardized norms with less than 2/10 pain to demonstrate necessary mobility for high quality and safe ADLs  Baseline:  Goal status: INITIAL  4.  Pt will report the ability to stand for >/= 30 minutes as to demonstrate improved  tolerance to standing for prolonged time and improved ability to participate in occupational tasks.  Baseline:  Goal status: INITIAL   ---------------------------------------------------------------------------------------------  PLAN:  PT FREQUENCY: 2x/week  PT DURATION: 6 weeks  PLANNED INTERVENTIONS: 97110-Therapeutic exercises, 97530- Therapeutic activity, [id number]- Neuromuscular re-education, 97535- Self Care, 16109- Manual therapy, [phone]- Gait training, [phone]- Aquatic Therapy, Patient/Family education, Balance training, Stair training, Joint mobilization, and Spinal mobilization.  PLAN FOR NEXT SESSION: Continue with therapeutic focus on lumbo-pelvic stabilization, dynamic core activation, and global hip strengthening.   Albesa Huguenin, PT, DPT 12/21/2023, 3:25 PM   For all possible CPT codes, reference the Planned Interventions line above.     Check all conditions that are expected to impact treatment: {Conditions expected to impact treatment:None of these apply   If treatment provided at initial evaluation, no treatment charged due to lack of authorization.

## 2023-12-26 ENCOUNTER — Ambulatory Visit: Admitting: Physical Therapy

## 2023-12-27 ENCOUNTER — Encounter: Payer: Self-pay | Admitting: Physical Therapy

## 2023-12-27 ENCOUNTER — Ambulatory Visit: Admitting: Physical Therapy

## 2023-12-27 DIAGNOSIS — R2689 Other abnormalities of gait and mobility: Secondary | ICD-10-CM

## 2023-12-27 DIAGNOSIS — M6281 Muscle weakness (generalized): Secondary | ICD-10-CM

## 2023-12-27 DIAGNOSIS — M5459 Other low back pain: Secondary | ICD-10-CM | POA: Diagnosis not present

## 2023-12-27 NOTE — Therapy (Signed)
 OUTPATIENT PHYSICAL THERAPY TREATMENT NOTE   Patient Name: Carmen Brown MRN: [id number] DOB:1941-06-15, 83 y.o., female Today's Date: 12/27/2023  END OF SESSION:  PT End of Session - 12/27/23 1358     Visit Number 7    Number of Visits 13    Date for PT Re-Evaluation 12/26/23    PT Start Time 1400    PT Stop Time 1430    PT Time Calculation (min) 30 min    Activity Tolerance Patient tolerated treatment well    Behavior During Therapy [facility] for tasks assessed/performed                 Past Medical History:  Diagnosis Date   Allergy    Arthritis    CAD (coronary artery disease)    95% proximal LAD stenosis with DES.  Jan 2019   Cardiomegaly    Cardiomyopathy (HCC)    EF 35% in the distant past but NL EF 2009   Hypertension, benign    systemic   Menopausal syndrome    Obesity    Osteoarthritis of lower leg, localized    Paroxysmal VT (HCC)    Thyroid  disease    Past Surgical History:  Procedure Laterality Date   CARPAL TUNNEL RELEASE Right 11/2016   CATARACT EXTRACTION  2020   Per patient   LEFT HEART CATH AND CORONARY ANGIOGRAPHY N/A 09/08/2017   Procedure: LEFT HEART CATH AND CORONARY ANGIOGRAPHY;  Surgeon: Sammy Crisp, MD;  Location: [facility];  Service: Cardiovascular;  Laterality: N/A;   MASS EXCISION  03/15/2012   Procedure: MINOR EXCISION OF MASS;  Surgeon: Amelie Baize., MD;  Location: [facility];  Service: Orthopedics;  Laterality: Right;  debride IP right long finger, excision mucoid cyst   ventricular tacticartia     Patient Active Problem List   Diagnosis Date Noted   Osteoporosis screening 12/06/2023   Bilateral low back pain with bilateral sciatica 12/06/2023   Bradycardia 11/09/2023   Elevated ALT measurement 10/13/2023   Lymphadenopathy 09/14/2023   Acute pain of left shoulder 06/23/2023   Abnormal serum iron level 04/06/2023   Palpitations 04/06/2023   Restless leg syndrome 08/26/2022   Prediabetes  08/26/2022   Need for vaccination 05/20/2022   Snoring 05/20/2022   Stage 2 chronic kidney disease 02/11/2022   Chronic cough 12/31/2021   Colon cancer screening 01/22/2021   Idiopathic gout of multiple sites 05/21/2020   Generalized osteoarthritis 05/21/2020   Fatigue 11/25/2019   Hyperlipidemia 11/20/2017   CAD (coronary artery disease) 09/09/2017   Unstable angina (HCC) 09/07/2017   Osteoarthritis of left knee 06/08/2016   Osteoarthritis of right knee 06/08/2016   Degenerative disc disease, lumbar 04/06/2015   PAROXYSMAL VENTRICULAR TACHYCARDIA 08/21/2009   Obesity 10/12/2006   Hypertension 10/12/2006   CARDIOMEGALY 10/12/2006   RHINITIS, ALLERGIC 10/12/2006   OSTEOARTHRITIS, LOWER LEG 10/12/2006   CERVICAL SPINE DISORDER, NOS 10/12/2006    PCP: Zorita Hiss, NP  REFERRING PROVIDER: Matt Song, MD  REFERRING DIAG: [phone] (ICD-10-CM) - Degeneration of intervertebral disc of lumbar region, unspecified whether pain present   Rationale for Evaluation and Treatment: Rehabilitation  THERAPY DIAG:  Other low back pain  Muscle weakness (generalized)  Other abnormalities of gait and mobility  PERTINENT HISTORY: HTN, tachycardia, Cardiomegaly, unstable angina, CAD, CKD2  WEIGHT BEARING RESTRICTIONS: No  FALLS:  Has patient fallen in last 6 months? Yes. 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 Pt stated that they have maintained good compliance with current HEP.  Better than when she started however, not where she wants to be. Still cannot work or stand comfortably for a long time.   Eval statement 11/14/2023: pt fell on Feb 28th, tripped on a gap between her garage floor and driveway, twisted her L ankle.no pain in L ankle currently. Fell on R knee, already is a bad knee and couldn't walk for a while, but was able to get  an injection that has helped R knee pain. Currently working with a [employer], feels like its been helping . Difficulties with prolonged standing or walking, gets tired quickly. Pt reports 2/10 pain currently, gets worse when walking, feels like her body is tired and has weights around her. No n/t symptoms  RED FLAGS: None, Bowel or bladder incontinence: No, and Compression fracture: No    PLOF: Independent  PATIENT GOALS: get back pain better  NEXT MD VISIT: may 23 ---------------------------------------------------------------------------------------------  OBJECTIVE:  Note: Objective measures were completed at Evaluation unless otherwise noted.  DIAGNOSTIC FINDINGS:  No recent imaging  PATIENT SURVEYS:  Modified Oswestry 21/50 (42%)   COGNITION: Overall cognitive status: Within functional limits for tasks assessed   PALPATION: No TTP  Lumbar contraction pattern  L Multifidus: Low quality contraction  R Multifidus:low quality contraction   SENSATION: WFL  MUSCLE LENGTH: Hamstrings: Right 0; deg; Left 0 deg  Thomas test: tight rec fem  POSTURE: increased lumbar lordosis   LUMBAR ROM:   AROM eval 12/27/2023   Flexion 90% 100%  Extension 30% 100%  Right lateral flexion 60% 90%  Left lateral flexion 60% 90%  Right rotation 60% 90%  Left rotation 60% 90%   (Blank rows = not tested)  ! Indicates pain with testing  LOWER EXTREMITY ROM:     Active  Right eval Left eval  Hip flexion [facility]  Hip extension [facility]  Hip  abduction    Hip adduction    Hip internal rotation    Hip external rotation    Knee flexion    Knee extension    Ankle dorsiflexion    Ankle plantarflexion  Ankle inversion    Ankle eversion     (Blank rows = not tested)  ! Indicates pain with testing  LOWER EXTREMITY MMT:    MMT Right eval 12/27/2023  Left eval 12/27/2023   Hip flexion 4- 4+ 4- 4+  Hip extension      Hip abduction 4 5 4 5   Hip adduction      Hip internal rotation      Hip external rotation      Knee flexion 4+ 5 4+ 5  Knee extension 4 5 4 5   Ankle dorsiflexion      Ankle plantarflexion      Ankle inversion      Ankle eversion       (Blank rows = not tested)   ! Indicates pain with testing  GAIT: Distance walked: 148ft Assistive device utilized: None Level of assistance: Complete Independence  [facility] Adult PT Treatment:                                                DATE: 12/27/2023 Therapeutic Activity: Re-evaluative measures  Self Care: POC discussion Pt education on prognosis, anatomy, and home management of symptoms  [facility] Adult PT Treatment:                                                DATE: 12/19/2023  Therapeutic Exercise: NuStep 8' Seated hamstring stretch w/APT 2x1' Neuromuscular re-ed: 90/90 hold 3x30s Pball curl 2x12, hold for one breath Therapeutic activity Supine hip fallouts  1x10B, GTB Bridge against 2x10, hold 5s GTB      PATIENT EDUCATION:  Education details: Pt received education regarding HEP performance, ADL performance, functional activity tolerance, impairment education, appropriate performance of therapeutic activities.  Person educated: Patient Education method: Explanation, Demonstration, Tactile cues, Verbal cues, and Handouts Education comprehension: verbalized understanding and returned demonstration  HOME EXERCISE PROGRAM: Access Code: [facility] URL: [email or web address] Date: 11/14/2023 Prepared by: Albesa Huguenin  Exercises -  Supine Figure 4 core/lumbar mobility  - 1 x daily - 4-7 x weekly - 2 sets - 20 reps - Supine Lower Trunk Rotation  - 1 x daily - 7 x weekly - 3 sets - 12 reps - 2s hold - Clamshell with Resistance  - 1 x daily - 7 x weekly - 2-3 sets - 10 reps - 3s hold - Supine Bridge with Resistance Band  - 1 x daily - 7 x weekly - 3 sets - 8 reps - 3s hold ---------------------------------------------------------------------------------------------  ASSESSMENT:  CLINICAL IMPRESSION:  Pt attended physical therapy session for re-evaluation of low back pain. Pt has met  2 goals and continues to work towards 3 others. Difficulties continue with perceived function, and pain during activity. Pt has made significant progress with strength and mobility in the lumbar spine as well as postural education/application during ADLs. However, pain continues to be the primary limiting factor with minimal change since the beginning of POC. It is reccommended pt follow up with PCP for further pain management options. Pt required minimal verbal cuing as well as no physical assistance for safe and appropriate performance of today's activities. Pt is to d/c at completion of today's session d/t not responding to therapy in terms of back pain.  Eval impression (11/14/2023): Pt. attended today's physical therapy session for evaluation of low back pain following a fall on feb 28th. Pt has complaints of 2/10 pain that gets worse with prolonged walking and standing. Pt has notable deficits with core activation patterns, global B hip strength, and lumbar AROM.   Pt would benefit from therapeutic focus on lumbo-pelvic stabilization, dynamic core activation, and global hip strengthening.  Treatment performed today focused on pt education detailed in obj. Pt demonstrated good understanding of education provided. required moderate verbal/tactile cues and no physical assistance for appropriate performance with today's activities. Pt was unable to enter a  prone position due to personal preference stating that "I just don't." Pt requires the intervention of skilled outpatient physical therapy to address the aforementioned deficits and progress towards a functional level in line with therapeutic goals.    OBJECTIVE IMPAIRMENTS: decreased activity tolerance, difficulty walking, decreased strength, impaired flexibility, improper body mechanics, and pain.   ACTIVITY LIMITATIONS: carrying, squatting, locomotion level, and caring for others  PARTICIPATION LIMITATIONS: driving, shopping, community activity, and occupation  PERSONAL FACTORS: Age, Behavior pattern, Fitness, Profession, and Time since onset of injury/illness/exacerbation are also affecting patient's functional outcome.   REHAB POTENTIAL: Good  CLINICAL DECISION MAKING: Stable/uncomplicated  EVALUATION COMPLEXITY: Low   GOALS: Goals reviewed with patient? YES  SHORT TERM GOALS: Target date: 12/05/2023  Pt will be independent with administered HEP to demonstrate the competency necessary for long term managemnet of symptoms at home. Baseline: Goal status: MET 12/27/2023   LONG TERM GOALS: Target date: 01/09/2024   Pt. Will achieve a MODI score of 15/50 (30%)  as to demonstrate improvement in self-perceived functional ability with daily activities.  Baseline: 21/50 (42%)  Goal status:NOT MET 12/27/2023 (21/50) (42%)   2.  Pt will improve Global hip strength to a 4+/5 to demonstrate improvement in strength for quality of motion and activity performance.  Baseline: see obj chart Goal status: MET 12/27/2023   3.  Pt will improve Lumbar AROM to 90% of standardized norms with less than 2/10 pain to demonstrate necessary mobility for high quality and safe ADLs  Baseline:  Goal status: PROGRESSING 12/27/2023   4.  Pt will report the ability to stand for >/= 30 minutes as to demonstrate improved tolerance to standing for prolonged time and improved ability to participate in  occupational tasks.  Baseline:  Goal status:PROGRESSING 12/27/2023    ---------------------------------------------------------------------------------------------  PLAN:  PT FREQUENCY: 2x/week  PT DURATION: 6 weeks  PLANNED INTERVENTIONS: 97110-Therapeutic exercises, 97530- Therapeutic activity, 97112- Neuromuscular re-education, 97535- Self Care, 47829- Manual therapy, [phone]- Gait training, [phone]- Aquatic Therapy, Patient/Family education, Balance training, Stair training, Joint mobilization, and Spinal mobilization.  PLAN FOR NEXT SESSION: d/c   PHYSICAL THERAPY DISCHARGE SUMMARY  Visits from Start of Care: 7  Current functional level related to goals / functional outcomes: See assessment   Remaining deficits: See assessment   Education / Equipment: See assessment   Patient agrees to discharge. Patient goals were partially met. Patient is being discharged due to did not respond to therapy.    Albesa Huguenin, PT, DPT 12/27/2023, 2:36 PM   For all possible CPT codes, reference the Planned Interventions line above.     Check all conditions that are expected to impact treatment: {Conditions expected to impact treatment:None of these apply   If treatment provided at initial evaluation, no treatment charged due to lack of authorization.

## 2023-12-28 ENCOUNTER — Ambulatory Visit: Admitting: Physical Therapy

## 2023-12-28 NOTE — Telephone Encounter (Signed)
 Pt is schedule to get it done on 01/11/24

## 2023-12-31 ENCOUNTER — Other Ambulatory Visit: Payer: Self-pay | Admitting: Cardiology

## 2024-01-02 ENCOUNTER — Ambulatory Visit: Admitting: Physical Therapy

## 2024-01-04 ENCOUNTER — Ambulatory Visit: Admitting: Physical Therapy

## 2024-01-05 ENCOUNTER — Ambulatory Visit: Payer: Self-pay | Admitting: Nurse Practitioner

## 2024-01-05 ENCOUNTER — Ambulatory Visit: Payer: BC Managed Care – PPO | Admitting: Nurse Practitioner

## 2024-01-05 ENCOUNTER — Ambulatory Visit: Admitting: Nurse Practitioner

## 2024-01-05 ENCOUNTER — Telehealth: Payer: Self-pay | Admitting: Nurse Practitioner

## 2024-01-05 VITALS — BP 162/96 | HR 74 | Temp 98.2°F | Ht 64.5 in | Wt 220.4 lb

## 2024-01-05 DIAGNOSIS — M5442 Lumbago with sciatica, left side: Secondary | ICD-10-CM

## 2024-01-05 DIAGNOSIS — R7401 Elevation of levels of liver transaminase levels: Secondary | ICD-10-CM

## 2024-01-05 DIAGNOSIS — M5441 Lumbago with sciatica, right side: Secondary | ICD-10-CM

## 2024-01-05 DIAGNOSIS — I10 Essential (primary) hypertension: Secondary | ICD-10-CM | POA: Diagnosis not present

## 2024-01-05 LAB — COMPREHENSIVE METABOLIC PANEL WITH GFR
ALT: 43 U/L — ABNORMAL HIGH (ref 0–35)
AST: 33 U/L (ref 0–37)
Albumin: 4.4 g/dL (ref 3.5–5.2)
Alkaline Phosphatase: 71 U/L (ref 39–117)
BUN: 24 mg/dL — ABNORMAL HIGH (ref 6–23)
CO2: 29 meq/L (ref 19–32)
Calcium: 10 mg/dL (ref 8.4–10.5)
Chloride: 102 meq/L (ref 96–112)
Creatinine, Ser: 1 mg/dL (ref 0.40–1.20)
GFR: 52.9 mL/min — ABNORMAL LOW (ref >60.00)
Glucose, Bld: 109 mg/dL — ABNORMAL HIGH (ref 70–99)
Potassium: 4.6 meq/L (ref 3.5–5.1)
Sodium: 140 meq/L (ref 135–145)
Total Bilirubin: 0.7 mg/dL (ref 0.2–1.2)
Total Protein: 7.4 g/dL (ref 6.0–8.3)

## 2024-01-05 MED ORDER — AMLODIPINE BESYLATE 10 MG PO TABS: 10.0000 mg | ORAL_TABLET | Freq: Every day | ORAL | 1 refills | Status: DC

## 2024-01-05 MED ORDER — AMLODIPINE BESYLATE 5 MG PO TABS: 5.0000 mg | ORAL_TABLET | Freq: Every day | ORAL | 1 refills | Status: DC

## 2024-01-05 NOTE — Assessment & Plan Note (Signed)
 Chronic Above goal with BP of 162/96 Per shared decision making will increase amlodipine  to 10 mg daily, she will continue lisinopril  20 mg twice daily and metoprolol  25 mg daily.  She was encouraged to monitor self for symptoms of hypotension as well as increased leg swelling and to reach out these were to occur.

## 2024-01-05 NOTE — Telephone Encounter (Signed)
 Please call patient and let her know that upon further chart review her amlodipine  2.5 mg tablet she is documented that she is taking this twice a day which means a total of 5 mg daily.  Thus, I am going to actually increase amlodipine  to 10 mg tablet by mouth once a day.  She should go ahead and fill the new prescription and start the new tablets when she is due just take amlodipine  again tomorrow.  Please let me know if she has any questions.

## 2024-01-05 NOTE — Telephone Encounter (Addendum)
 Called pt and made her aware of providers notes. Pt aware and verbalized understanding .

## 2024-01-05 NOTE — Assessment & Plan Note (Addendum)
 Patient encouraged to follow-up with neurosurgery next week. It appears symptoms overall are stable/slightly improved since working with physical therapy.  No new red flag signs/symptoms.

## 2024-01-05 NOTE — Progress Notes (Signed)
 Established Patient Office Visit  Subjective   Patient ID: Carmen Brown, female    DOB: May 14, 1941  Age: 83 y.o. MRN: [id number]  Chief Complaint  Patient presents with   Hypertension    Patient has today for follow-up.  She continues to have back pain however she has been able to remain active including working in her garden.  She sees neurosurgery next week.  She did complete a few physical therapy treatments and has been discharged from them but is continuing exercises at home.  Blood pressure remains elevated and she feels its likely related to pain from her back.  She is currently on amlodipine  2.5 mg BID, lisinopril  20mg  BID, and metoprolol  25mg /day.     Review of Systems  Cardiovascular:  Negative for chest pain.  Musculoskeletal:  Positive for back pain.  Neurological:  Negative for dizziness.      Objective:     BP (!) 162/96   Pulse 74   Temp 98.2 F (36.8 C) (Temporal)   Ht 5' 4.5" (1.638 m)   Wt 220 lb 6 oz (100 kg)   SpO2 94%   BMI 37.24 kg/m  BP Readings from Last 3 Encounters:  01/05/24 (!) 162/96  12/06/23 (!) 148/90  11/10/23 (!) 144/82   Wt Readings from Last 3 Encounters:  01/05/24 220 lb 6 oz (100 kg)  12/06/23 221 lb 4 oz (100.4 kg)  11/10/23 223 lb (101.2 kg)      Physical Exam Vitals reviewed.  Constitutional:      General: She is not in acute distress.    Appearance: Normal appearance.  HENT:     Head: Normocephalic and atraumatic.  Neck:     Vascular: No carotid bruit.  Cardiovascular:     Rate and Rhythm: Normal rate and regular rhythm.     Pulses: Normal pulses.     Heart sounds: Normal heart sounds.  Pulmonary:     Effort: Pulmonary effort is normal.     Breath sounds: Normal breath sounds.  Skin:    General: Skin is warm and dry.  Neurological:     General: No focal deficit present.     Mental Status: She is alert and oriented to person, place, and time.  Psychiatric:        Mood and Affect: Mood normal.         Behavior: Behavior normal.        Judgment: Judgment normal.      No results found for any visits on 01/05/24.    The ASCVD Risk score (Arnett DK, et al., 2019) failed to calculate for the following reasons:   The 2019 ASCVD risk score is only valid for ages 39 to 61   Risk score cannot be calculated because patient has a medical history suggesting prior/existing ASCVD    Assessment & Plan:   Problem List Items Addressed This Visit       Cardiovascular and Mediastinum   Hypertension - Primary   Chronic Above goal with BP of 162/96 Per shared decision making will increase amlodipine  to 10 mg daily, she will continue lisinopril  20 mg twice daily and metoprolol  25 mg daily.  She was encouraged to monitor self for symptoms of hypotension as well as increased leg swelling and to reach out these were to occur.      Relevant Medications   amLODipine  (NORVASC ) 10 MG tablet   Other Relevant Orders   Comprehensive metabolic panel with GFR     Nervous  and Auditory   Bilateral low back pain with bilateral sciatica   Patient encouraged to follow-up with neurosurgery next week. It appears symptoms overall are stable/slightly improved since working with physical therapy.  No new red flag signs/symptoms.         Other   Elevated ALT measurement   Periodic incidental finding.  Asymptomatic.  Check CMP today for monitoring.      Relevant Orders   Comprehensive metabolic panel with GFR    Return in about 4 months (around 05/07/2024) for F/U with Kenechukwu Eckstein.    Zorita Hiss, NP

## 2024-01-05 NOTE — Assessment & Plan Note (Signed)
 Periodic incidental finding.  Asymptomatic.  Check CMP today for monitoring.

## 2024-01-05 NOTE — Patient Instructions (Signed)
 We are increasing your amlodipine  to 5mg /day. If you have dizziness, lightheadedness, shortness of breath, chest pain, swelling in legs please reach out to us .

## 2024-01-11 ENCOUNTER — Other Ambulatory Visit

## 2024-01-11 ENCOUNTER — Ambulatory Visit
Admission: RE | Admit: 2024-01-11 | Discharge: 2024-01-11 | Disposition: A | Discharge status: Home / Self Care | Source: Ambulatory Visit | Attending: Nurse Practitioner | Admitting: Nurse Practitioner

## 2024-01-11 DIAGNOSIS — R591 Generalized enlarged lymph nodes: Secondary | ICD-10-CM

## 2024-01-31 ENCOUNTER — Ambulatory Visit: Payer: Self-pay | Admitting: Family

## 2024-02-03 ENCOUNTER — Other Ambulatory Visit: Payer: Self-pay | Admitting: Internal Medicine

## 2024-02-03 DIAGNOSIS — M1A09X0 Idiopathic chronic gout, multiple sites, without tophus (tophi): Secondary | ICD-10-CM

## 2024-02-05 NOTE — Telephone Encounter (Signed)
 Last Fill: 11/03/2023  Labs: 01/05/2024 Glucose 109, BUN 24, ALT 43, GFR 52.90, Uric Acid 6.2  Next Visit: 05/10/2024  Last Visit: 11/03/2023  DX: Idiopathic chronic gout of multiple sites without tophus   Current Dose per office note 11/03/2023: Allopurinol  300mg  daily   Okay to refill Allopurinol ?

## 2024-04-26 NOTE — Progress Notes (Signed)
 Office Visit Note  Patient: Carmen Brown             Date of Birth: [birth year]           MRN: [id number]             PCP: Elnor Lauraine BRAVO, NP Referring: Elnor Lauraine BRAVO, NP Visit Date: 05/10/2024   Subjective:  Other (Patient states she had fallen in February and has had back problems since then. Patient states she stopped taking the Allopurinol . Patient states she wants to talk about Neuropathy. )  Discussed the use of AI scribe software for clinical note transcription with the patient, who gave verbal consent to proceed.  History of Present Illness   Carmen Brown is a 83 y.o. female here for follow up for gout stopped allopurinol  300 mg since last visit due to symptoms doing well and unexplained lymphadenopathy being evaluated.  She has experienced chronic back pain since a fall in February. The pain is characterized by a sensation of tiredness in the back when standing or walking for extended periods, rather than as shooting or constant pain. Initially, she could hardly walk or stand, but now she can walk around a store for about 45 minutes before feeling tired and needing to rest. She has undergone physical therapy, chiropractic care, dry needling, deep tissue massage, and acupuncture, which have provided some improvement. She is not currently taking any anti-inflammatory medications and was previously prescribed muscle relaxers, which she took last week without noticing any significant effect.  She stopped taking allopurinol  on June 10th due to concerns about lymphadenopathy potentially related to the medication. Since discontinuation, she has not experienced any gout flares. She mentions a history of enlarged lymph nodes and has undergone a chest CT scan, but the results are pending. She is unsure if she still has enlarged lymph nodes.  She has been on a low-carb diet for two months, reducing her intake to 20 carbs a day, but has only lost two pounds. Her primary care doctor is  checking her thyroid  function due to her weight concerns. Her TSH was noted to be slightly elevated, and further thyroid  hormone levels are being assessed.  She reports neuropathy in her feet, particularly the left foot, which has been bothersome for a while.     She had neurosurgical evaluation with [facility] and pain has had trial of local steroid injections twice, with some improvement after the second.  MRI from June did show lumbar spondylolisthesis worst below L4.   Previous HPI 11/03/2023 Carmen Brown is a 83 y.o. female here for follow up for gout on allopurinol  300 mg daily.  She has not had any flares, no had to use any prednisone . Denies any joint swelling, redness or tenderness. She usually gets her gout on her feet. Has been doing well taking the allopurinol  300mg  daily.    She unfortunately fell in her garage about three weeks ago. She twisted her left ankle and fell on her right side. She injured her knee and right lower back. He saw her orthopedist who gave her a prednisone  injection last week and just recently got an HA injection. She states that this helped the pain in her knee.    She has seen a chiropractor for her lower back. She was told to move but finds this really hard to do 2/2 pain. She has been unable to work. Tried some tylenol  but has not been taking it. Her pain is dull and  primarily over the right lower back. She feels tense and really tired.    Previous HPI 01/27/2023 Carmen Brown is a 83 y.o. female here for follow up for gout on allopurinol  300 mg daily.  Doing well at this had prednisone  prescription called in for use as needed has not required additional rounds for flareup.  Continues follow-up with orthopedic surgery for osteoarthritis of the knees.    Previous HPI 07/22/22 Carmen Brown is a 83 y.o. female here for follow up for gout on allopurinol  300 mg daily. She has been doing well without  flare ups most of this year just having issue at the  right foot. 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 She sees orthopedics clinic for bilateral knee injections every 6 months with good benefit.   Previous HPI 07/23/21 Carmen Brown is a 83 y.o. female here for follow up for gout on allopurinol  300 mg daily. Overall doing well she had COVID this summer and stopped taking colchicine  with concern about medications and side effects at that time. She started taking tart cherry supplement regularly. She has only had one flare in the right foot for which she took steroids which resolved.   Previous HPI 01/13/21 Carmen Brown is a 83 y.o. female here for follow up for gout currently on allopurinol  300 mg p.o. daily and colchicine  0.6 mg daily.  Symptoms are doing pretty well she has not experienced any significant flare or attacks since the last visit.  She has noticed some increased weight gain attributes this to a lot of difficulty maintaining a good or balanced diet while constantly traveling.  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   Review of Systems  Constitutional:  Positive for fatigue.  HENT:  Negative for mouth sores and mouth dryness.   Eyes:  Negative for dryness.  Respiratory:  Negative for shortness of breath.   Cardiovascular:  Negative for chest pain and palpitations.  Gastrointestinal:  Negative for blood in stool, constipation and diarrhea.  Endocrine: Negative for increased urination.  Genitourinary:  Negative for involuntary urination.  Musculoskeletal:  Positive for muscle tenderness. Negative for joint pain, gait problem, joint pain, joint swelling, myalgias, muscle weakness, morning stiffness and myalgias.  Skin:  Negative for color change, rash, hair loss and sensitivity to  sunlight.  Allergic/Immunologic: Negative for susceptible to infections.  Neurological:  Negative for dizziness and headaches.  Hematological:  Negative for swollen glands.  Psychiatric/Behavioral:  Positive for sleep disturbance. Negative for depressed mood. The patient is not nervous/anxious.     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   Chronic cough 12/31/2021   Colon cancer screening 01/22/2021   Gout 05/21/2020  Generalized osteoarthritis 05/21/2020   Fatigue 11/25/2019   Hyperlipidemia 11/20/2017   CAD (coronary artery disease) 09/09/2017   Unstable angina (HCC) 09/07/2017   Osteoarthritis of left knee 06/08/2016   Osteoarthritis of right knee 06/08/2016   Degenerative disc disease, lumbar 04/06/2015   PAROXYSMAL VENTRICULAR TACHYCARDIA 08/21/2009   Obesity 10/12/2006   Hypertension 10/12/2006   CARDIOMEGALY 10/12/2006   RHINITIS, ALLERGIC 10/12/2006   OSTEOARTHRITIS, LOWER LEG 10/12/2006   CERVICAL SPINE DISORDER, NOS 10/12/2006    Past Medical History:  Diagnosis Date   Allergy    Arthritis    CAD (coronary artery disease)    95% proximal LAD stenosis with DES.  Jan 2019   Cardiomegaly    Cardiomyopathy (HCC)    EF 35% in the distant past but NL EF 2009   Hypertension, benign    systemic   Menopausal syndrome    Obesity    Osteoarthritis of lower leg, localized    Paroxysmal VT  (HCC)    Thyroid  disease     Family History  Problem Relation Age of Onset   Heart disease Mother 62       MI   Heart disease Father 26       CABG   Cancer Sister        lyphoma and ovarian   CAD Sister 77       Stent   Lymphoma Sister    Diabetes Maternal Uncle    Stroke Maternal Grandmother    Diabetes Maternal Grandfather    Past Surgical History:  Procedure Laterality Date   CARPAL TUNNEL RELEASE Right 11/2016   CATARACT EXTRACTION  2020   Per patient   LEFT HEART CATH AND CORONARY ANGIOGRAPHY N/A 09/08/2017   Procedure: LEFT HEART CATH AND CORONARY ANGIOGRAPHY;  Surgeon: Mady Bruckner, MD;  Location: [facility];  Service: Cardiovascular;  Laterality: N/A;   MASS EXCISION  03/15/2012   Procedure: MINOR EXCISION OF MASS;  Surgeon: Lamar LULLA Leonor Mickey., MD;  Location: [facility];  Service: Orthopedics;  Laterality: Right;  debride IP right long finger, excision mucoid cyst   ventricular tacticartia     Social History   Social History Narrative   Financial controller for [employer].  Education: 34yrs.  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 Caffeine 2 cups decaf. (not every day).   Immunization History  Administered Date(s) Administered   Fluad Quad(high Dose 65+) 07/16/2020, 07/23/2021, 05/20/2022   INFLUENZA, HIGH DOSE SEASONAL PF 07/19/2018   Influenza Split 08/28/2011   Influenza,inj,Quad PF,6+ Mos 07/22/2014   Influenza-Unspecified 05/29/2016, 05/15/2017   Moderna Sars-Covid-2 Vaccination 09/13/2019, 10/09/2019, 05/15/2020   Pneumococcal Polysaccharide-23 07/28/2009   Td 07/15/2000, 02/21/2012   Tdap 02/21/2012   Zoster Recombinant(Shingrix) 08/20/2018, 01/31/2019   Zoster, Live 05/16/2011     Objective: Vital Signs: BP 122/77   Pulse 72   Temp 97.7 F (36.5 C)   Resp 14   Ht 5' 4.5 (1.638 m)   Wt 218 lb 3.2 oz (99 kg)   BMI 36.88 kg/m    Physical Exam Eyes:     Conjunctiva/sclera: Conjunctivae normal.  Cardiovascular:     Rate and Rhythm: Normal rate and  regular rhythm.  Pulmonary:     Effort: Pulmonary effort is normal.     Breath sounds: Normal breath sounds.  Skin:    General: Skin is warm and dry.  Neurological:     Mental Status: She is alert.  Psychiatric:        Mood and Affect: Mood  normal.      Musculoskeletal Exam:  Elbows full ROM no tenderness or swelling Wrists full ROM no tenderness or swelling Fingers full ROM no tenderness or swelling No paraspinal tenderness to palpation over upper and lower back, negative straight leg test.  Hip normal internal and external rotation without pain, no tenderness to lateral hip palpation Knees full ROM no tenderness or swelling  Investigation: No additional findings.  Imaging: No results found.  Recent Labs: Lab Results  Component Value Date   WBC 6.3 04/06/2023   HGB 14.6 04/06/2023   PLT 229.0 04/06/2023   NA 139 05/03/2024   K 4.2 05/03/2024   CL 106 05/03/2024   CO2 26 05/03/2024   GLUCOSE 88 05/03/2024   BUN 37 (H) 05/03/2024   CREATININE 0.93 05/03/2024   BILITOT 0.4 05/03/2024   ALKPHOS 69 05/03/2024   AST 19 05/03/2024   ALT 28 05/03/2024   PROT 6.9 05/03/2024   ALBUMIN 4.1 05/03/2024   CALCIUM  9.3 05/03/2024   GFRAA 74 01/13/2021    Speciality Comments: No specialty comments available.  Procedures:  No procedures performed Allergies: Epinephrine and Latex   Assessment / Plan:     Visit Diagnoses: Idiopathic chronic gout of multiple sites without tophus - 11/03/2023 Uric Acid-6.2 - Plan: Uric acid Allopurinol  was stopped due to lymphadenopathy. No recent issues after stopping allopurinol . Uric acid levels need checking to determine if allopurinol  should be resumed. - Order uric acid level test.  Medication monitoring encounter - allopurinol  300mg  daily  Hypothyroidism, unspecified type - Plan: TSH + free T4, T3, Thyroid  Peroxidase Antibodies (TPO) (REFL) Elevated TSH suggests hypothyroidism. Further thyroid  function tests needed to confirm  diagnosis. - Order thyroid  function tests, including T3 and T4 levels.   Bilateral low back pain with bilateral sciatica, unspecified chronicity - Plan: gabapentin  (NEURONTIN ) 300 MG capsule MRI shows degenerative discs and spondylolisthesis at L4-L5 without nerve impingement. Gabapentin  considered for neuropathic component. - Prescribe gabapentin , starting with 300 mg at night to assess response and side effects.  Neuropathy of left foot - Plan: gabapentin  (NEURONTIN ) 300 MG capsule Gabapentin  is a potential treatment option for neuropathy as well as radicular back pain as above    Orders: Orders Placed This Encounter  Procedures   TSH + free T4   T3   Uric acid   Thyroid  Peroxidase Antibodies (TPO) (REFL)   Meds ordered this encounter  Medications   gabapentin  (NEURONTIN ) 300 MG capsule    Sig: Take 1 capsule (300 mg total) by mouth at bedtime.    Dispense:  30 capsule    Refill:  2     Follow-Up Instructions: Return if symptoms worsen or fail to improve.   Lonni LELON Ester, MD  Note - This record has been created using [employer].  Chart creation errors have been sought, but may not always  have been located. Such creation errors do not reflect on  the standard of medical care.

## 2024-05-03 ENCOUNTER — Ambulatory Visit: Admitting: Nurse Practitioner

## 2024-05-03 VITALS — BP 136/72 | HR 73 | Temp 97.9°F | Ht 64.5 in | Wt 219.0 lb

## 2024-05-03 DIAGNOSIS — M1A09X0 Idiopathic chronic gout, multiple sites, without tophus (tophi): Secondary | ICD-10-CM

## 2024-05-03 DIAGNOSIS — M51369 Other intervertebral disc degeneration, lumbar region without mention of lumbar back pain or lower extremity pain: Secondary | ICD-10-CM | POA: Diagnosis not present

## 2024-05-03 DIAGNOSIS — I10 Essential (primary) hypertension: Secondary | ICD-10-CM

## 2024-05-03 DIAGNOSIS — Z6837 Body mass index (BMI) 37.0-37.9, adult: Secondary | ICD-10-CM

## 2024-05-03 DIAGNOSIS — R599 Enlarged lymph nodes, unspecified: Secondary | ICD-10-CM | POA: Diagnosis not present

## 2024-05-03 DIAGNOSIS — E66812 Obesity, class 2: Secondary | ICD-10-CM

## 2024-05-03 DIAGNOSIS — R7303 Prediabetes: Secondary | ICD-10-CM

## 2024-05-03 LAB — COMPREHENSIVE METABOLIC PANEL WITH GFR
ALT: 28 U/L (ref 0–35)
AST: 19 U/L (ref 0–37)
Albumin: 4.1 g/dL (ref 3.5–5.2)
Alkaline Phosphatase: 69 U/L (ref 39–117)
BUN: 37 mg/dL — ABNORMAL HIGH (ref 6–23)
CO2: 26 meq/L (ref 19–32)
Calcium: 9.3 mg/dL (ref 8.4–10.5)
Chloride: 106 meq/L (ref 96–112)
Creatinine, Ser: 0.93 mg/dL (ref 0.40–1.20)
GFR: 57.58 mL/min — ABNORMAL LOW (ref >60.00)
Glucose, Bld: 88 mg/dL (ref 70–99)
Potassium: 4.2 meq/L (ref 3.5–5.1)
Sodium: 139 meq/L (ref 135–145)
Total Bilirubin: 0.4 mg/dL (ref 0.2–1.2)
Total Protein: 6.9 g/dL (ref 6.0–8.3)

## 2024-05-03 LAB — LIPID PANEL
Cholesterol: 105 mg/dL (ref 0–200)
HDL: 44 mg/dL (ref >39.00)
LDL Cholesterol: 44 mg/dL (ref 0–99)
NonHDL: 61.28
Total CHOL/HDL Ratio: 2
Triglycerides: 86 mg/dL (ref 0.0–149.0)
VLDL: 17.2 mg/dL (ref 0.0–40.0)

## 2024-05-03 LAB — TSH: TSH: 6.23 u[IU]/mL — ABNORMAL HIGH (ref 0.35–5.50)

## 2024-05-03 LAB — HEMOGLOBIN A1C: Hgb A1c MFr Bld: 6.3 % (ref 4.6–6.5)

## 2024-05-03 NOTE — Assessment & Plan Note (Signed)
 Enlarged upper abdominal lymph node Most recent CT scan did not capture lymph node. Initially identified in October 2024. - Order CT scan of abdomen and pelvis to evaluate lymph node.

## 2024-05-03 NOTE — Assessment & Plan Note (Signed)
 Primary hypertension Blood pressure well-controlled on current medication regimen. - Continue current medications: Amlodipine  10 mg, Lisinopril  20 mg, Metoprolol  25 mg.

## 2024-05-03 NOTE — Assessment & Plan Note (Signed)
 Obesity with possible insulin resistance Difficulty losing weight despite low-carb diet. Possible insulin resistance due to prediabetes history. Increased activity may aid weight loss. - Track calorie intake using an app or manually. - Aim for 1000-1100 calories per day. - Consider low glycemic index diet. - Check A1c, cholesterol, and thyroid  levels.

## 2024-05-03 NOTE — Progress Notes (Signed)
 Established Patient Office Visit  Subjective   Patient ID: Carmen Brown, female    DOB: [birth year]  Age: 83 y.o. MRN: [id number]  No chief complaint on file.   Discussed the use of AI scribe software for clinical note transcription with the patient, who gave verbal consent to proceed.  History of Present Illness Carmen Brown is an 83 year old female who presents for a four-month follow-up regarding her weight and back pain management.  Chronic back pain - Ongoing back pain since a fall in February 2024 - Initial pain was overshadowed by severe knee pain - Gradual improvement in back pain, but not sufficient to return to work - Physical therapy, dry needling, deep tissue massage, and two epidural injections attempted; second injection provided some relief - Acupuncture has offered slight improvement - Continues chiropractic care - Considering another neurosurgical consultation - On month-to-month FMLA leave due to back pain; contemplating a 90-day leave for simplicity. Paperwork handled by chiropractor  Weight management - Following a low-carbohydrate diet for 2 months, limiting intake to 20 total carbohydrates per day - No significant weight loss despite dietary changes - Reduced physical activity due to back pain may be contributing to lack of weight loss  Lymphadenopathy -Seen on Chest CT 05/2023 -Repeat CT in 12/2023 did not capture area of concern, needs CT scan of abd/pelvis      ROS: see hPI    Objective:     BP 136/72   Pulse 73   Temp 97.9 F (36.6 C) (Temporal)   Ht 5' 4.5 (1.638 m)   Wt 219 lb (99.3 kg)   SpO2 97%   BMI 37.01 kg/m  BP Readings from Last 3 Encounters:  05/03/24 136/72  01/05/24 (!) 162/96  12/06/23 (!) 148/90   Wt Readings from Last 3 Encounters:  05/03/24 219 lb (99.3 kg)  01/05/24 220 lb 6 oz (100 kg)  12/06/23 221 lb 4 oz (100.4 kg)      Physical Exam Vitals reviewed.  Constitutional:      General: She is not in  acute distress.    Appearance: Normal appearance.  HENT:     Head: Normocephalic and atraumatic.  Cardiovascular:     Rate and Rhythm: Normal rate and regular rhythm.     Pulses: Normal pulses.     Heart sounds: Normal heart sounds.  Pulmonary:     Effort: Pulmonary effort is normal.     Breath sounds: Normal breath sounds.  Skin:    General: Skin is warm and dry.  Neurological:     General: No focal deficit present.     Mental Status: She is alert and oriented to person, place, and time.  Psychiatric:        Mood and Affect: Mood normal.        Behavior: Behavior normal.        Judgment: Judgment normal.      No results found for any visits on 05/03/24.    The ASCVD Risk score (Arnett DK, et al., 2019) failed to calculate for the following reasons:   The 2019 ASCVD risk score is only valid for ages 51 to 37   Risk score cannot be calculated because patient has a medical history suggesting prior/existing ASCVD    Assessment & Plan:   Problem List Items Addressed This Visit       Cardiovascular and Mediastinum   Hypertension   Primary hypertension Blood pressure well-controlled on current medication regimen. - Continue current  medications: Amlodipine  10 mg, Lisinopril  20 mg, Metoprolol  25 mg.        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 Partial relief from previous treatments. Considering consulting another neurosurgeon. - Continue acupuncture treatment. - Continue follow-up with chiropractor. - Consider referral to a different neurosurgeon if needed.        Immune and Lymphatic   Enlarged lymph nodes - Primary   Enlarged upper abdominal lymph node Most recent CT scan did not capture lymph node. Initially identified in October 2024. - Order CT scan of abdomen and pelvis to evaluate lymph  node.      Relevant Orders   CT ABDOMEN PELVIS WO CONTRAST   Hemoglobin A1c   Comprehensive metabolic panel with GFR   Lipid panel   TSH     Other   Obesity   Obesity with possible insulin resistance Difficulty losing weight despite low-carb diet. Possible insulin resistance due to prediabetes history. Increased activity may aid weight loss. - Track calorie intake using an app or manually. - Aim for 1000-1100 calories per day. - Consider low glycemic index diet. - Check A1c, cholesterol, and thyroid  levels.      Relevant Orders   Hemoglobin A1c   Comprehensive metabolic panel with GFR   Lipid panel   TSH   Gout   Gout Off allopurinol  since June 2025 due to potential side effects. No gout flares reported. - Follow up with rheumatology for further management.      Prediabetes    Prediabetes Prediabetes with recent normal A1c. Possible insulin resistance affecting weight loss. - Check A1c to assess current status.      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 Partial relief from previous treatments. Considering consulting another neurosurgeon. - Continue acupuncture treatment. - Continue follow-up with chiropractor. - Consider referral to a different neurosurgeon if needed.  Obesity with possible insulin resistance Difficulty losing weight despite low-carb diet. Possible insulin resistance due to prediabetes history. Increased activity may aid weight loss. - Track calorie intake using an app or manually. - Aim for 1000-1100 calories per day. - Consider low glycemic index diet. - Check A1c, cholesterol, and thyroid  levels.  Primary hypertension Blood pressure well-controlled on current medication regimen. - Continue current medications: Amlodipine  10 mg, Lisinopril  20 mg, Metoprolol  25 mg.  Prediabetes Prediabetes with recent  normal A1c. Possible insulin resistance affecting weight loss. - Check A1c to assess current status.  Gout Off allopurinol  since June 2025 due to potential side effects. No gout flares reported. - Follow up with rheumatology for further management.  Enlarged upper abdominal lymph node Most recent CT scan did not capture lymph node. Initially identified in October 2024. - Order CT scan of abdomen and pelvis to evaluate lymph node.  I personally spent a total of 39 minutes in the care of the patient today including preparing to see the patient, getting/reviewing separately obtained history, performing a medically appropriate exam/evaluation, counseling and educating, placing orders, referring and communicating with other health care professionals, documenting clinical information in the EHR, and independently interpreting results.   Return in about 6 weeks (around 06/14/2024).    Lauraine FORBES Pereyra, NP

## 2024-05-03 NOTE — Patient Instructions (Signed)
 Low-glycemic index  My Fitness Pal  Goal calories: 1000-1100 cal/day

## 2024-05-03 NOTE — Assessment & Plan Note (Signed)
 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 Partial relief from previous treatments. Considering consulting another neurosurgeon. - Continue acupuncture treatment. - Continue follow-up with chiropractor. - Consider referral to a different neurosurgeon if needed.

## 2024-05-03 NOTE — Assessment & Plan Note (Signed)
  Prediabetes Prediabetes with recent normal A1c. Possible insulin resistance affecting weight loss. - Check A1c to assess current status.

## 2024-05-03 NOTE — Assessment & Plan Note (Signed)
 Gout Off allopurinol  since June 2025 due to potential side effects. No gout flares reported. - Follow up with rheumatology for further management.

## 2024-05-06 ENCOUNTER — Ambulatory Visit: Payer: Self-pay | Admitting: Nurse Practitioner

## 2024-05-06 DIAGNOSIS — R7989 Other specified abnormal findings of blood chemistry: Secondary | ICD-10-CM

## 2024-05-10 ENCOUNTER — Ambulatory Visit: Attending: Internal Medicine | Admitting: Internal Medicine

## 2024-05-10 ENCOUNTER — Telehealth: Payer: Self-pay

## 2024-05-10 ENCOUNTER — Encounter: Payer: Self-pay | Admitting: Internal Medicine

## 2024-05-10 VITALS — BP 122/77 | HR 72 | Temp 97.7°F | Resp 14 | Ht 64.5 in | Wt 218.2 lb

## 2024-05-10 DIAGNOSIS — M1A09X0 Idiopathic chronic gout, multiple sites, without tophus (tophi): Secondary | ICD-10-CM

## 2024-05-10 DIAGNOSIS — M5442 Lumbago with sciatica, left side: Secondary | ICD-10-CM | POA: Diagnosis not present

## 2024-05-10 DIAGNOSIS — Z5181 Encounter for therapeutic drug level monitoring: Secondary | ICD-10-CM | POA: Diagnosis not present

## 2024-05-10 DIAGNOSIS — M5441 Lumbago with sciatica, right side: Secondary | ICD-10-CM

## 2024-05-10 DIAGNOSIS — G5792 Unspecified mononeuropathy of left lower limb: Secondary | ICD-10-CM

## 2024-05-10 DIAGNOSIS — E039 Hypothyroidism, unspecified: Secondary | ICD-10-CM

## 2024-05-10 MED ORDER — GABAPENTIN 300 MG PO CAPS: 300.0000 mg | ORAL_CAPSULE | Freq: Every day | ORAL | 2 refills | Status: DC

## 2024-05-10 NOTE — Telephone Encounter (Signed)
 Copied from CRM #[id number]. Topic: Appointments - Appointment Scheduling >> May 10, 2024  4:01 PM Viola F wrote: Patient wants to schedule CT SCAN, preferable anytime/any day - says she hasn't heard from scheduling, I let her know someone reached out twice, she said she wouldn't answer if she didn't know the number

## 2024-05-13 ENCOUNTER — Telehealth: Payer: Self-pay

## 2024-05-13 LAB — URIC ACID: Uric Acid, Serum: 7.2 mg/dL — ABNORMAL HIGH (ref 2.5–7.0)

## 2024-05-13 LAB — THYROID PEROXIDASE ANTIBODIES (TPO) (REFL): Thyroperoxidase Ab SerPl-aCnc: 1 [IU]/mL (ref <9)

## 2024-05-13 LAB — T3: T3, Total: 74 ng/dL — ABNORMAL LOW (ref 76–181)

## 2024-05-13 LAB — TSH+FREE T4: TSH W/REFLEX TO FT4: 2.09 m[IU]/L (ref 0.40–4.50)

## 2024-05-13 NOTE — Telephone Encounter (Signed)
 Patient states she took her first dose of gabapentin  300mg  on Saturday evening. She reports that she could not function yesterday. She was loopy and disorientated. She had someone drive her yesterday for her scheduled plans as she was uncomfortable driving. She denies headache or any other symptoms but reports she is not going to take the medication. I advised the patient not to take another dose due to the side effects she experienced. Patient would like to know if there is an alternative medication she can try. Please advise.

## 2024-05-15 ENCOUNTER — Telehealth: Payer: Self-pay

## 2024-05-15 DIAGNOSIS — E2740 Unspecified adrenocortical insufficiency: Secondary | ICD-10-CM

## 2024-05-15 HISTORY — DX: Unspecified adrenocortical insufficiency: E27.40

## 2024-05-15 NOTE — Telephone Encounter (Signed)
 Copied from CRM [id number]. Topic: Clinical - Lab/Test Results >> May 15, 2024  9:51 AM Carmen Brown wrote: Reason for CRM: pt called because she has not been contacted about her lab results from 9/26. She is concerned because she saw some abnormal readings in her MyChart. I informed her that Lauraine has not reviewed results yet, and she should expect a callback from a nurse once she does. She verbalized understanding. Please call and advise.

## 2024-05-16 NOTE — Telephone Encounter (Signed)
 Spoke to pt and made her aware of results and also placed referral to endocrinology

## 2024-05-17 ENCOUNTER — Telehealth: Payer: Self-pay | Admitting: *Deleted

## 2024-05-17 NOTE — Telephone Encounter (Signed)
 Patient contacted the office to inquire about her lab results. Please review and advise.  Patient states she was also given a prescription for Gabapentin . Patient states she is unable to take that medication due to feeling loopy. Patient would like to know what else can be prescribed.

## 2024-05-30 ENCOUNTER — Telehealth: Payer: Self-pay | Admitting: Internal Medicine

## 2024-05-30 DIAGNOSIS — G5792 Unspecified mononeuropathy of left lower limb: Secondary | ICD-10-CM

## 2024-05-30 DIAGNOSIS — M5441 Lumbago with sciatica, right side: Secondary | ICD-10-CM

## 2024-05-30 NOTE — Telephone Encounter (Signed)
 Patients 4th time calling. She would like to speak with the office manager.

## 2024-05-30 NOTE — Telephone Encounter (Signed)
 Pt called stating she is upset about Dr. Jeannetta not getting back to her about her lab results and the medication he prescribed her. Patient is wanting to speak with someone about this.

## 2024-05-31 MED ORDER — GABAPENTIN 100 MG PO CAPS: 100.0000 mg | ORAL_CAPSULE | Freq: Every day | ORAL | 0 refills | Status: DC

## 2024-05-31 NOTE — Addendum Note (Signed)
 Addended by: JEANNETTA LONNI ORN on: 05/31/2024 08:27 AM   Modules accepted: Orders

## 2024-05-31 NOTE — Telephone Encounter (Signed)
 LMOM for patient to return call if she wanted to discuss any issues.

## 2024-05-31 NOTE — Telephone Encounter (Signed)
 I spoke with Carmen Brown about her side effects with the initial trial of gabapentin  300 mg.  The symptoms are relatively typical for the medication but often dose-dependent we discussed trying the medicine at a lower dose.  I will send a prescription for gabapentin  100 mg she can try taking.  If this has the same side effects we will need to try something different, potentially consider alternative such as SNRI but will require a longer-term trial.  If is not effective she could try to try changing the dose. We discussed her lab results uric acid of 7.2 this is above goal of 6.0 for gout patients but considering she has not had any new flares in quite a long time despite being on the allopurinol  for greater than 3 months I think she can monitor this for now.  Interestingly her repeat TSH was in the normal range despite being slightly elevated at her primary care labs last month but the T3 level was slightly low.  Subsequently she has seen endocrinology who apparently did repeat testing with normal thyroid  function at that time but with a low cortisol level and recommended follow-up next month.

## 2024-05-31 NOTE — Telephone Encounter (Signed)
 I spoke with her earlier this morning. Note should be visible in the phone message encounter.

## 2024-06-03 NOTE — Telephone Encounter (Signed)
 Patient called, patient states gabapentin  is not helping, patient cannot take steroids, robaxin  doesn't help, patient is wanting something for pain, patient advised to contact her neurosurgeon for pain medications, patient advised we do not prescribe pain medication. Please advise.

## 2024-06-10 MED ORDER — DULOXETINE HCL 20 MG PO CPEP: 20.0000 mg | ORAL_CAPSULE | Freq: Every day | ORAL | 2 refills | Status: DC

## 2024-06-10 NOTE — Telephone Encounter (Signed)
 I spoke with Carmen Brown again.  Unfortunately she still reported similar side effects on the decreased dose of gabapentin  but at a stop taking the medication.  As previously discussed I recommended she stop this and we will try starting her on low-dose duloxetine for her osteoarthritis and peripheral neuropathy related pain.  Given medicine sensitivity on the gabapentin  will start at low-dose 20 mg once daily.  I counseled that this medicine does need to be taken once daily to be effective to minimize side effects and if possible should monitor for at least 2 weeks before determining efficacy or any dose titration.  If she cannot tolerate this or gets no benefit could consider low-dose tramadol  as a treatment option if symptoms remain very limiting.

## 2024-06-10 NOTE — Addendum Note (Signed)
 Addended by: JEANNETTA LONNI ORN on: 06/10/2024 08:07 AM   Modules accepted: Orders

## 2024-06-25 ENCOUNTER — Telehealth: Payer: Self-pay | Admitting: *Deleted

## 2024-06-25 NOTE — Telephone Encounter (Signed)
 Patient called, patient cannot tell that Cymbalta is helping, can patient d/c, is there something else you can prescribe? [facility], [place].

## 2024-06-27 ENCOUNTER — Ambulatory Visit: Admitting: Nurse Practitioner

## 2024-06-27 VITALS — BP 142/90 | HR 86 | Temp 97.9°F | Ht 64.5 in | Wt 222.5 lb

## 2024-06-27 DIAGNOSIS — R599 Enlarged lymph nodes, unspecified: Secondary | ICD-10-CM

## 2024-06-27 DIAGNOSIS — E66811 Obesity, class 1: Secondary | ICD-10-CM

## 2024-06-27 DIAGNOSIS — I10 Essential (primary) hypertension: Secondary | ICD-10-CM | POA: Diagnosis not present

## 2024-06-27 DIAGNOSIS — R7303 Prediabetes: Secondary | ICD-10-CM

## 2024-06-27 DIAGNOSIS — M5442 Lumbago with sciatica, left side: Secondary | ICD-10-CM

## 2024-06-27 DIAGNOSIS — M5441 Lumbago with sciatica, right side: Secondary | ICD-10-CM

## 2024-06-27 DIAGNOSIS — Z6834 Body mass index (BMI) 34.0-34.9, adult: Secondary | ICD-10-CM

## 2024-06-27 NOTE — Assessment & Plan Note (Signed)
 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 Previous medications ineffective. Undergoing chiropractic care and acupuncture. - Continue chiropractic care and acupuncture for back pain management.

## 2024-06-27 NOTE — Progress Notes (Signed)
 Established Patient Office Visit  Subjective   Patient ID: Carmen Brown, female    DOB: 03/11/1941  Age: 83 y.o. MRN: [id number]  Chief Complaint  Patient presents with   Obesity    Discussed the use of AI scribe software for clinical note transcription with the patient, who gave verbal consent to proceed.  History of Present Illness Carmen Brown is an 83 year old female who presents for follow-up on her blood pressure and weight management.  Hypertension - Currently treated with amlodipine  10mg /day, lisinopril  20mg /day, and metoprolol  25mg /day - Often reports it is higher in the office - Experiencing grief secondary to relative passing away yesterday  Chronic back pain - Persistent back pain following a fall. - Pain has resulted in absence from work for almost a year. [place] [place] ongoing care from a chiropractor and acupuncture. - Gabapentin  caused adverse effects and was discontinued. - Duloxetine was stopped after two weeks due to no improvement  Weight management and prediabetes - Exploring weight loss options due to prediabetes. - Consulted a dietitian but finds dietary guidelines confusing. - Low-carbohydrate diet was unsuccessful, possibly due to lack of calorie monitoring. - Interested in weight loss medications but concerned about cost. - Pain and reduced physical activity contribute to difficulty with diet and weight management.  Lymphadenopathy - incidental finding on imaging - Due for repeat imaging      ROS: see HPI    Objective:     BP (!) 142/90   Pulse 86   Temp 97.9 F (36.6 C) (Temporal)   Ht 5' 4.5 (1.638 m)   Wt 222 lb 8 oz (100.9 kg)   SpO2 95%   BMI 37.60 kg/m  BP Readings from Last 3 Encounters:  06/27/24 (!) 142/90  05/10/24 122/77  05/03/24 136/72   Wt Readings from Last 3 Encounters:  06/27/24 222 lb 8 oz (100.9 kg)  05/10/24 218 lb 3.2 oz (99 kg)  05/03/24 219 lb (99.3 kg)      Physical Exam Vitals reviewed.   Constitutional:      General: She is not in acute distress.    Appearance: Normal appearance.  HENT:     Head: Normocephalic and atraumatic.  Cardiovascular:     Rate and Rhythm: Normal rate and regular rhythm.     Pulses: Normal pulses.     Heart sounds: Normal heart sounds.  Pulmonary:     Effort: Pulmonary effort is normal.     Breath sounds: Normal breath sounds.  Skin:    General: Skin is warm and dry.  Neurological:     General: No focal deficit present.     Mental Status: She is alert and oriented to person, place, and time.  Psychiatric:        Mood and Affect: Mood normal.        Behavior: Behavior normal.        Judgment: Judgment normal.      No results found for any visits on 06/27/24.    The ASCVD Risk score (Arnett DK, et al., 2019) failed to calculate for the following reasons:   The 2019 ASCVD risk score is only valid for ages 6 to 63   Risk score cannot be calculated because patient has a medical history suggesting prior/existing ASCVD    Assessment & Plan:   Problem List Items Addressed This Visit       Immune and Lymphatic   Enlarged lymph nodes - Primary   Relevant Orders   CT ABDOMEN  PELVIS WO CONTRAST   Assessment and Plan Assessment & Plan Primary hypertension Blood pressure elevated during visit, possibly due to white coat hypertension. 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 Previous medications ineffective. Undergoing chiropractic care and acupuncture. - Continue chiropractic care and acupuncture for back pain management.  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 Discussed weight loss medications and their side effects. Emphasized diet importance. Insurance coverage is a  concern. - Follow up with endocrinologist regarding weight loss options. - Consider enrolling in Medicare for potential coverage of weight loss medications. - Explore discount programs for Wegovy and Zepbound if considering medication.  Prediabetes Prediabetes confirmed. Discussed dietary management and insulin resistance. Referred to dietitian. - Continue follow up with dietitian for dietary management.  Enlarged upper abdominal lymph node Previous CT scan not completed. No contrast dye allergy. - Ordered CT scan of abdomen and pelvis to evaluate lymph node.   Return in about 6 weeks (around 08/08/2024) for F/U with Lauraine, HTN.    Lauraine FORBES Pereyra, NP

## 2024-06-27 NOTE — Assessment & Plan Note (Signed)
 Primary hypertension Blood pressure elevated during visit, possibly due to white coat hypertension. - Per shared decision making patient will continue on amlodipine  10mg /day, lisinopril  20mg /day, and metoprolol  25mg /day - Follow-up in 6 weeks for another BP check

## 2024-06-27 NOTE — Assessment & Plan Note (Signed)
 Enlarged upper abdominal lymph node Previous CT scan not completed. No contrast dye allergy. - Ordered CT scan of abdomen and pelvis to evaluate lymph node.

## 2024-06-27 NOTE — Assessment & Plan Note (Signed)
 Prediabetes Prediabetes confirmed. Discussed dietary management and insulin resistance. Referred to dietitian. - Continue follow up with dietitian for dietary management.

## 2024-06-27 NOTE — Assessment & Plan Note (Signed)
 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 Discussed weight loss medications and their side effects. Emphasized diet importance. Insurance coverage is a concern. - Follow up with endocrinologist regarding weight loss options. - Consider enrolling in Medicare for potential coverage of weight loss medications. - Explore discount programs for Wegovy and Zepbound if considering medication.

## 2024-07-01 ENCOUNTER — Other Ambulatory Visit: Payer: Self-pay | Admitting: Nurse Practitioner

## 2024-07-01 DIAGNOSIS — I10 Essential (primary) hypertension: Secondary | ICD-10-CM

## 2024-07-30 ENCOUNTER — Other Ambulatory Visit: Payer: Self-pay | Admitting: Internal Medicine

## 2024-07-30 DIAGNOSIS — M5441 Lumbago with sciatica, right side: Secondary | ICD-10-CM

## 2024-07-30 DIAGNOSIS — G5792 Unspecified mononeuropathy of left lower limb: Secondary | ICD-10-CM

## 2024-08-10 ENCOUNTER — Encounter (HOSPITAL_COMMUNITY): Payer: Self-pay | Admitting: Emergency Medicine

## 2024-08-10 ENCOUNTER — Other Ambulatory Visit: Payer: Self-pay

## 2024-08-10 ENCOUNTER — Emergency Department (HOSPITAL_COMMUNITY)

## 2024-08-10 ENCOUNTER — Emergency Department (HOSPITAL_COMMUNITY)
Admission: EM | Admit: 2024-08-10 | Discharge: 2024-08-10 | Disposition: A | Discharge status: Home / Self Care | Attending: Emergency Medicine | Admitting: Emergency Medicine

## 2024-08-10 DIAGNOSIS — N189 Chronic kidney disease, unspecified: Secondary | ICD-10-CM | POA: Insufficient documentation

## 2024-08-10 DIAGNOSIS — I129 Hypertensive chronic kidney disease with stage 1 through stage 4 chronic kidney disease, or unspecified chronic kidney disease: Secondary | ICD-10-CM | POA: Diagnosis not present

## 2024-08-10 DIAGNOSIS — Z7982 Long term (current) use of aspirin: Secondary | ICD-10-CM | POA: Insufficient documentation

## 2024-08-10 DIAGNOSIS — M5442 Lumbago with sciatica, left side: Secondary | ICD-10-CM | POA: Insufficient documentation

## 2024-08-10 DIAGNOSIS — Z91040 Latex allergy status: Secondary | ICD-10-CM | POA: Insufficient documentation

## 2024-08-10 DIAGNOSIS — M545 Low back pain, unspecified: Secondary | ICD-10-CM | POA: Diagnosis present

## 2024-08-10 DIAGNOSIS — R531 Weakness: Secondary | ICD-10-CM | POA: Insufficient documentation

## 2024-08-10 DIAGNOSIS — Z79899 Other long term (current) drug therapy: Secondary | ICD-10-CM | POA: Diagnosis not present

## 2024-08-10 DIAGNOSIS — I2510 Atherosclerotic heart disease of native coronary artery without angina pectoris: Secondary | ICD-10-CM | POA: Diagnosis not present

## 2024-08-10 DIAGNOSIS — R29898 Other symptoms and signs involving the musculoskeletal system: Secondary | ICD-10-CM

## 2024-08-10 LAB — COMPREHENSIVE METABOLIC PANEL WITH GFR
ALT: 42 U/L (ref 0–44)
AST: 40 U/L (ref 15–41)
Albumin: 3.9 g/dL (ref 3.5–5.0)
Alkaline Phosphatase: 67 U/L (ref 38–126)
Anion gap: 11 (ref 5–15)
BUN: 26 mg/dL — ABNORMAL HIGH (ref 8–23)
CO2: 26 mmol/L (ref 22–32)
Calcium: 9.4 mg/dL (ref 8.9–10.3)
Chloride: 101 mmol/L (ref 98–111)
Creatinine, Ser: 0.81 mg/dL (ref 0.44–1.00)
GFR, Estimated: >60 mL/min
Glucose, Bld: 83 mg/dL (ref 70–99)
Potassium: 4.5 mmol/L (ref 3.5–5.1)
Sodium: 137 mmol/L (ref 135–145)
Total Bilirubin: 0.5 mg/dL (ref 0.0–1.2)
Total Protein: 6.8 g/dL (ref 6.5–8.1)

## 2024-08-10 LAB — CBC WITH DIFFERENTIAL/PLATELET
Abs Immature Granulocytes: 0.12 K/uL — ABNORMAL HIGH (ref 0.00–0.07)
Basophils Absolute: 0.1 K/uL (ref 0.0–0.1)
Basophils Relative: 1 %
Eosinophils Absolute: 0.7 K/uL — ABNORMAL HIGH (ref 0.0–0.5)
Eosinophils Relative: 6 %
HCT: 39.6 % (ref 36.0–46.0)
Hemoglobin: 13.4 g/dL (ref 12.0–15.0)
Immature Granulocytes: 1 %
Lymphocytes Relative: 18 %
Lymphs Abs: 2.2 K/uL (ref 0.7–4.0)
MCH: 33.9 pg (ref 26.0–34.0)
MCHC: 33.8 g/dL (ref 30.0–36.0)
MCV: 100.3 fL — ABNORMAL HIGH (ref 80.0–100.0)
Monocytes Absolute: 0.9 K/uL (ref 0.1–1.0)
Monocytes Relative: 8 %
Neutro Abs: 8 K/uL — ABNORMAL HIGH (ref 1.7–7.7)
Neutrophils Relative %: 66 %
Platelets: 250 K/uL (ref 150–400)
RBC: 3.95 MIL/uL (ref 3.87–5.11)
RDW: 13.1 % (ref 11.5–15.5)
WBC: 11.9 K/uL — ABNORMAL HIGH (ref 4.0–10.5)
nRBC: 0 % (ref 0.0–0.2)

## 2024-08-10 MED ORDER — OXYCODONE-ACETAMINOPHEN 5-325 MG PO TABS: 1.0000 | ORAL_TABLET | Freq: Four times a day (QID) | ORAL | 0 refills | Status: DC | PRN

## 2024-08-10 MED ORDER — OXYCODONE-ACETAMINOPHEN 5-325 MG PO TABS
1.0000 | ORAL_TABLET | Freq: Once | ORAL | Status: AC
  Administered 2024-08-10: 1 via ORAL
  Filled 2024-08-10: qty 1

## 2024-08-10 MED ORDER — MORPHINE SULFATE (PF) 4 MG/ML IV SOLN
4.0000 mg | Freq: Once | INTRAVENOUS | Status: AC
  Administered 2024-08-10: 4 mg via INTRAVENOUS
  Filled 2024-08-10: qty 1

## 2024-08-10 NOTE — ED Triage Notes (Signed)
 Pt BIB [facility] from home for L. Side back, hip, upper back pain x 2-3 days worsening. Degenerative disc disease and fall in February that put her out of work.   154/88 HR 78, RR 18, 93% RA

## 2024-08-10 NOTE — ED Provider Notes (Signed)
 " [facility] Provider Note   CSN: [id number] Arrival date & time: 08/10/24  9072     Patient presents with: No chief complaint on file.   Carmen Brown is a 83 y.o. female.   The history is provided by the patient and medical records. No language interpreter was used.  Back Pain Location:  Lumbar spine and sacro-iliac joint Quality:  Aching, shooting and stabbing Radiates to:  L posterior upper leg and L thigh Pain severity:  Severe Pain is:  Unable to specify Onset quality:  Gradual Duration:  5 days Timing:  Constant Progression:  Waxing and waning Context comment:  Worsened after chiropractor adjusted her back on Tuesday. Relieved by:  Nothing Exacerbated by: manipulation on tuesday. Ineffective treatments:  None tried Associated symptoms: weakness (per pt in left leg with painb)   Associated symptoms: no abdominal pain, no bladder incontinence, no bowel incontinence, no chest pain, no dysuria, no fever, no headaches, no numbness, no perianal numbness and no tingling        Prior to Admission medications  Medication Sig Start Date End Date Taking? Authorizing Provider  amLODipine  (NORVASC ) 10 MG tablet TAKE 1 TABLET(10 MG) BY MOUTH DAILY 07/02/24   Elnor Lauraine BRAVO, NP  aspirin  EC 81 MG tablet Take 1 tablet (81 mg total) by mouth daily. 07/23/21   Lavona Agent, MD  atorvastatin  (LIPITOR ) 40 MG tablet Take 1 tablet (40 mg total) by mouth daily. 12/04/23   Lavona Agent, MD  fluticasone  (FLONASE ) 50 MCG/ACT nasal spray SHAKE LIQUID AND USE 2 SPRAYS IN EACH NOSTRIL DAILY 08/07/23   Elnor Lauraine BRAVO, NP  lisinopril  (ZESTRIL ) 20 MG tablet TAKE 1 TABLET(20 MG) BY MOUTH TWICE DAILY 01/01/24   Lavona Agent, MD  loratadine (CLARITIN) 10 MG tablet Take 10 mg by mouth daily.    [provider]  methocarbamol  (ROBAXIN ) 500 MG tablet Take 1 tablet (500 mg total) by mouth every 8 (eight) hours as needed for muscle spasms. 12/06/23    Elnor Lauraine BRAVO, NP  metoprolol  succinate (TOPROL -XL) 25 MG 24 hr tablet TAKE 1 TABLET(25 MG) BY MOUTH DAILY 12/01/23   Lavona Agent, MD  nitroGLYCERIN  (NITROSTAT ) 0.4 MG SL tablet Place 1 tablet (0.4 mg total) under the tongue every 5 (five) minutes x 3 doses as needed for chest pain. 09/09/17   Strader, Laymon HERO, PA-C  OMEGA MONOPURE CURCUMIN EC 125-600 MG CPDR  10/29/21   [provider]    Allergies: Epinephrine and Latex    Review of Systems  Constitutional:  Negative for chills, diaphoresis, fatigue and fever.  HENT:  Negative for congestion.   Respiratory:  Negative for cough, chest tightness, shortness of breath and wheezing.   Cardiovascular:  Negative for chest pain.  Gastrointestinal:  Negative for abdominal pain, bowel incontinence, constipation, diarrhea, nausea and vomiting.  Genitourinary:  Negative for bladder incontinence, dysuria and flank pain.  Musculoskeletal:  Positive for back pain. Negative for neck pain and neck stiffness.  Neurological:  Positive for weakness (per pt in left leg with painb). Negative for tingling, light-headedness, numbness and headaches.  Psychiatric/Behavioral:  Negative for agitation.     Updated Vital Signs BP 131/78 (BP Location: Left Arm)   Pulse 66   Temp 98.1 F (36.7 C) (Oral)   Resp 16   SpO2 96%   Physical Exam Vitals and nursing note reviewed.  Constitutional:      General: She is not in acute distress.  Appearance: She is well-developed. She is not ill-appearing, toxic-appearing or diaphoretic.  HENT:     Head: Normocephalic and atraumatic.     Nose: Nose normal.     Mouth/Throat:     Mouth: Mucous membranes are moist.  Eyes:     Extraocular Movements: Extraocular movements intact.     Conjunctiva/sclera: Conjunctivae normal.     Pupils: Pupils are equal, round, and reactive to light.  Cardiovascular:     Rate and Rhythm: Normal rate and regular rhythm.     Heart sounds: No murmur heard. Pulmonary:      Effort: Pulmonary effort is normal. No respiratory distress.     Breath sounds: Normal breath sounds. No wheezing, rhonchi or rales.  Chest:     Chest wall: No tenderness.  Abdominal:     Palpations: Abdomen is soft.     Tenderness: There is no abdominal tenderness.  Musculoskeletal:        General: Tenderness present. No swelling.     Cervical back: Neck supple.     Lumbar back: Tenderness present.       Back:     Comments: Tenderness in mid and left low back with pain going down the left leg.  Weakness in left leg compared to right.  Skin:    General: Skin is warm and dry.     Capillary Refill: Capillary refill takes less than 2 seconds.     Findings: No erythema or rash.  Neurological:     General: No focal deficit present.     Mental Status: She is alert.     Sensory: No sensory deficit.     Motor: Weakness present.  Psychiatric:        Mood and Affect: Mood normal.     (all labs ordered are listed, but only abnormal results are displayed) Labs Reviewed  CBC WITH DIFFERENTIAL/PLATELET - Abnormal; Notable for the following components:      Result Value   WBC 11.9 (*)    MCV 100.3 (*)    Neutro Abs 8.0 (*)    Eosinophils Absolute 0.7 (*)    Abs Immature Granulocytes 0.12 (*)    All other components within normal limits  COMPREHENSIVE METABOLIC PANEL WITH GFR - Abnormal; Notable for the following components:   BUN 26 (*)    All other components within normal limits    EKG: None  Radiology: No results found.   Procedures   Medications Ordered in the ED  morphine  (PF) 4 MG/ML injection 4 mg (4 mg Intravenous Given 08/10/24 1449)                                    Medical Decision Making Amount and/or Complexity of Data Reviewed Labs: ordered. Radiology: ordered.  Risk Prescription drug management.    Carmen Brown is a 83 y.o. female with a past medical history significant for hypertension, paroxysmal ventricular tachycardia, CAD, CKD and  chronic back problems who presents with acutely worsened low back pain going down the left leg and wrapping around the left leg.  Patient reports that since Tuesday when she saw a chiropractor for chronic back pain, she has been having worsened pain in her left low back going down her left leg.  She reports she has had difficulty walking with it and has not been to sleep due to the pain.  She reports that previously she has had steroid injections  but due to her reported cortisol levels she was not able to get steroids anymore.  She reports that it is acutely worsened for the last 5 days and she presents for evaluation.  She reports it has felt weak in the left leg when she tries to move it with the pain but denies numbness.  She denies loss of bowel or bladder control.  She denies any other falls or trauma.  She denies any rash to suggest shingles.  Denies any new fevers, chills, congestion, cough, nausea, vomiting, constipation, diarrhea, or urinary changes.  On exam, lungs clear and chest nontender.  Abdomen nontender.  Patient has tenderness in her left and mid low back going on her leg.  She had pain with movement of it.  She had some weakness in the left leg compared to the right but had intact sensation and pulse.  No skin changes seen initially.  Symmetric strength in arms.  Patient appears uncomfortable.  We had a shared decision-making conversation on management.  Given the patient's evolution and worsening pain with some weakness and the painfully different than before after having her back adjusted, we will get imaging to rule out any acute surgical problem.  Will get MRI lumbar spine without contrast and we will give her some pain medicine.  Will get some screening labs in case she does have a surgical problem.  Anticipate reassessment after MRI to determine disposition.  If workup reassuring, anticipate discharge to follow-up with her back doctor.         Care transferred oncoming team to wait  for MRI and workup results.      Final diagnoses:  Acute left-sided low back pain with left-sided sciatica  Weakness of left leg     Clinical Impression: 1. Acute left-sided low back pain with left-sided sciatica   2. Weakness of left leg     Disposition: Care transferred oncoming team to wait for MRI and workup results.  This note was prepared with assistance of Conservation officer, historic buildings. Occasional wrong-word or sound-a-like substitutions may have occurred due to the inherent limitations of voice recognition software.       Shara Hartis, Lonni PARAS, MD 08/10/24 1511  "

## 2024-08-10 NOTE — Discharge Instructions (Signed)
 As we discussed, your MRI showed pinched nerve at L4-5 level  I have prescribed Percocet as needed for pain  Please follow-up with Dr. Joshua as scheduled in January.  You can call him next week to see if he can see you sooner  Return to ER if you have worse weakness or numbness or trouble walking

## 2024-08-10 NOTE — ED Provider Notes (Signed)
" °  Physical Exam  BP (!) 143/81   Pulse 78   Temp 98.1 F (36.7 C) (Oral)   Resp 17   SpO2 98%   Physical Exam  Procedures  Procedures  ED Course / MDM    Medical Decision Making Care assumed at 3 PM.  Patient is here with worsening back pain after trying chiropractor manipulation.  Signed out pending MRI of the lumbar spine  4:36 PM MRI showed possible impingement of the transversing left L5 root.  Patient states that she had multiple steroid injections and she actually has low cortisol level was told that she cannot have steroids.  Will put patient on pain medicine and she is already on Robaxin .  Patient has follow-up with Dr. Joshua in the middle of January to discuss surgical option  Amount and/or Complexity of Data Reviewed Labs: ordered. Radiology: ordered.  Risk Prescription drug management.         Carmen Alm Macho, MD 08/10/24 [phone]  "

## 2024-08-10 NOTE — ED Triage Notes (Signed)
 Pt also states she has low cortisol levels so she can't take steroids.

## 2024-08-22 ENCOUNTER — Ambulatory Visit: Admitting: Nurse Practitioner

## 2024-08-22 VITALS — BP 110/72 | HR 111 | Temp 98.5°F | Ht 64.5 in | Wt 221.8 lb

## 2024-08-22 DIAGNOSIS — M5442 Lumbago with sciatica, left side: Secondary | ICD-10-CM | POA: Diagnosis not present

## 2024-08-22 MED ORDER — OXYCODONE-ACETAMINOPHEN 5-325 MG PO TABS: 1.0000 | ORAL_TABLET | Freq: Three times a day (TID) | ORAL | 0 refills | Status: DC | PRN

## 2024-08-22 MED ORDER — CYCLOBENZAPRINE HCL 5 MG PO TABS: 5.0000 mg | ORAL_TABLET | Freq: Three times a day (TID) | ORAL | 1 refills | Status: DC | PRN

## 2024-08-22 MED ORDER — KETOROLAC TROMETHAMINE 30 MG/ML IJ SOLN
30.0000 mg | Freq: Once | INTRAMUSCULAR | Status: AC
  Administered 2024-08-22: 30 mg via INTRAMUSCULAR

## 2024-08-22 MED ORDER — KETOROLAC TROMETHAMINE 30 MG/ML IJ SOLN: 30.0000 mg | Freq: Once | INTRAMUSCULAR | Status: DC

## 2024-08-22 NOTE — Progress Notes (Signed)
 "  Established Patient Office Visit  Subjective   Patient ID: Carmen Brown, female    DOB: Feb 24, 1941  Age: 84 y.o. MRN: [id number]  Chief Complaint  Patient presents with   Follow-up    6 wk f/u blood pressure. Pt is having pain on left side due to a possible pinched nerve.    Discussed the use of AI scribe software for clinical note transcription with the patient, who gave verbal consent to proceed.  History of Present Illness Carmen Brown is an 84 year old female who presents with severe back pain and difficulty sleeping due to a pinched nerve.  Lumbosacral radicular pain - Severe lower back pain began approximately 2 weeks ago - Pain radiates through the buttock to the knee - Throbbing  - Constant and severe since onset - Due to severity patient went to ER - MRI at hospital confirmed a pinched nerve, negative for fracture - Oxycodone  provided temporary improvement in sleep but not pain; supply ran out  - Tylenol  up to 3000 mg daily with minimal benefit - Methocarbamol  ineffective - Advised to avoid ibuprofen due to CKD  Functional impairment - Major difficulty walking and performing daily activities - Mostly bedridden - Can only sit briefly - Relies on a walker borrowed from a friend  Sleep disturbance - Severe pain prevents sleep - Oxycodone  improved sleep temporarily while available - Persistent poor sleep since running out of oxycodone   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  Constitutional:      General: She is not in acute distress.    Appearance: Normal appearance.  HENT:     Head: Normocephalic and atraumatic.  Cardiovascular:     Rate and Rhythm: Normal rate and regular rhythm.     Pulses: Normal pulses.     Heart sounds: Normal heart sounds.  Pulmonary:     Effort: Pulmonary effort is normal.     Breath sounds: Normal breath sounds.  Skin:    General: Skin is warm and dry.  Neurological:     General: No focal deficit present.     Mental Status: She is alert and oriented to person, place, and time.     Sensory: Sensation is intact.     Motor: Motor function is intact.     Coordination: Coordination is intact.     Gait: Gait abnormal (antalgic gait using walker).  Psychiatric:        Mood and Affect: Mood normal.        Behavior: Behavior normal.        Judgment: Judgment normal.      No results found for any visits on 08/22/24.    The ASCVD Risk score (Arnett DK, et al.,  2019) failed to calculate for the following reasons:   The 2019 ASCVD risk score is only valid for ages 19 to 70   Risk score cannot be calculated because patient has a medical history suggesting prior/existing ASCVD   * - Cholesterol units were assumed    Assessment & Plan:   Problem List Items Addressed This Visit       Nervous and Auditory   Low back pain with left-sided sciatica - Primary   Low back pain with left-sided sciatica Chronic low back pain with acute exacerbation and left-sided sciatica due to pinched nerve. MRI confirmed pinched nerve. Current pain management insufficient. 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  - Administered Toradol  30mg  IM once for immediate pain relief. - Prescribed oxycodone  for nighttime use to aid sleep. Limit supply to 3 days. If needed longer will refer to pain management. - Prescribed  cyclobenzaprine  as a new muscle relaxer for morning use, avoid concurrent use with oxycodone . - Advised follow-up with spine specialist on January 13th as scheduled  Chronic kidney disease Borderline kidney function. Avoidance of ibuprofen due to potential kidney function worsening. - Continue to avoid NSAIDs such as ibuprofen.      Relevant Medications   oxyCODONE -acetaminophen  (PERCOCET) 5-325 MG tablet   cyclobenzaprine  (FLEXERIL ) 5 MG tablet   Assessment and Plan Assessment & Plan Low back pain with left-sided sciatica Chronic low back pain with acute exacerbation and left-sided sciatica due to pinched nerve. MRI confirmed pinched nerve. Current pain management insufficient. 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  - Administered Toradol  30mg  IM once for immediate pain relief. - Prescribed oxycodone  for nighttime use to aid sleep. Limit supply to 3 days. If needed longer will refer to pain management. - Prescribed cyclobenzaprine  as a new muscle relaxer for morning use, avoid concurrent use with oxycodone . - Advised follow-up with spine specialist on January 13th as scheduled  Chronic kidney disease Borderline kidney function. Avoidance of ibuprofen due to potential kidney function worsening. - Continue to avoid NSAIDs such as ibuprofen.    Return in about 3 months (around 11/20/2024) for F/U with Edris Schneck.    Lauraine FORBES Pereyra, NP  "

## 2024-08-22 NOTE — Assessment & Plan Note (Signed)
 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 MRI confirmed pinched nerve. Current pain management insufficient. 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  - Administered Toradol  30mg  IM once for immediate pain relief. - Prescribed oxycodone  for nighttime use to aid sleep. Limit supply to 3 days. If needed longer will refer to pain management. - Prescribed cyclobenzaprine  as a new muscle relaxer for morning use, avoid concurrent use with oxycodone . - Advised follow-up with spine specialist on January 13th as scheduled  Chronic kidney disease Borderline kidney function. Avoidance of ibuprofen due to potential kidney function worsening. - Continue to avoid NSAIDs such as ibuprofen.

## 2024-08-22 NOTE — Patient Instructions (Signed)
 Due not take both the oxycodone  and the cyclobenzaprine  at the same time due to risk of over sedation. Do not operate heavy machinery, drive, or drink alcohol when taking the oxycodone  or the cyclobenzaprine .

## 2024-08-29 ENCOUNTER — Other Ambulatory Visit: Payer: Self-pay | Admitting: Nurse Practitioner

## 2024-08-29 DIAGNOSIS — M5442 Lumbago with sciatica, left side: Secondary | ICD-10-CM

## 2024-08-29 NOTE — Telephone Encounter (Signed)
 Copied from CRM [id number]. Topic: Clinical - Medication Refill >> Aug 29, 2024 12:47 PM Carmen Brown wrote: Medication: oxyCODONE -acetaminophen  (PERCOCET) 5-325 MG tablet **ATTN** Patient is requesting to have this medication refill 1x. She was seen last week by PCP 08/22/2024, and saw her surgeon Tuesday 08/27/2024, patient stated she forgot to ask him for a 1x refill on the medication and was wondering if PCP could do so.   Has the patient contacted their pharmacy? Yes (Agent: If no, request that the patient contact the pharmacy for the refill. If patient does not wish to contact the pharmacy document the reason why and proceed with request.) (Agent: If yes, when and what did the pharmacy advise?)  This is the patient's preferred pharmacy:  [id number] - [place], [place] - [address] AT [facility] [address] [place] [address] Phone: [phone] Fax: [phone]  Is this the correct pharmacy for this prescription? Yes If no, delete pharmacy and type the correct one.   Has the prescription been filled recently? Yes  Is the patient out of the medication? No ( 2 tablets left)  Has the patient been seen for an appointment in the last year OR does the patient have an upcoming appointment? Yes  Can we respond through MyChart? Yes  Agent: Please be advised that Rx refills may take up to 3 business days. We ask that you follow-up with your pharmacy.

## 2024-08-30 ENCOUNTER — Telehealth (HOSPITAL_BASED_OUTPATIENT_CLINIC_OR_DEPARTMENT_OTHER): Payer: Self-pay | Admitting: *Deleted

## 2024-08-30 ENCOUNTER — Other Ambulatory Visit: Payer: Self-pay | Admitting: Neurological Surgery

## 2024-08-30 MED ORDER — OXYCODONE-ACETAMINOPHEN 5-325 MG PO TABS: 1.0000 | ORAL_TABLET | Freq: Three times a day (TID) | ORAL | 0 refills | Status: DC | PRN

## 2024-08-30 NOTE — Telephone Encounter (Signed)
" ° °  Name: Carmen Brown  DOB: 1940-09-01  MRN: [id number]  Primary Cardiologist: Lynwood Schilling, MD  Chart reviewed as part of pre-operative protocol coverage. Because of Carmen Brown past medical history and time since last visit, she will require a follow-up in-office visit in order to better assess preoperative cardiovascular risk.  Pre-op covering staff: - Please schedule appointment and call patient to inform them. If patient already had an upcoming appointment within acceptable timeframe, please add pre-op clearance to the appointment notes so provider is aware. - Please contact requesting surgeon's office via preferred method (i.e, phone, fax) to inform them of need for appointment prior to surgery.  Okay to hold Aspirin  for spinal surgery given >1 year from PCI.  Layton Naves E Naydelin Ziegler, PA-C  08/30/2024, 4:54 PM   "

## 2024-08-30 NOTE — Telephone Encounter (Signed)
"  ° °  Pre-operative Risk Assessment    Patient Name: Carmen Brown  DOB: [birth year] MRN: [id number]   Date of last office visit: 11/10/23 DR. HOCHREIN Date of next office visit: NONE   Request for Surgical Clearance    Procedure:  L4-5 LUMBAR FUSION  Date of Surgery:  Clearance 09/18/24                                Surgeon:  DR. ALM MOLT Surgeon's Group or Practice Name:  [facility] Phone number:  [phone] Fax number:  [phone] [phone] Carmen Brown   Type of Clearance Requested:   - Medical  - Pharmacy:  Hold Aspirin      Type of Anesthesia:  General    Additional requests/questions:    Carmen Brown   08/30/2024, 4:33 PM   "

## 2024-08-30 NOTE — Telephone Encounter (Signed)
 Called patient to schedule a Office appointment for a pre-op clearance on 09/09/24 @2 :45 with Lum Louis NP

## 2024-09-05 ENCOUNTER — Telehealth: Payer: Self-pay

## 2024-09-05 DIAGNOSIS — M5442 Lumbago with sciatica, left side: Secondary | ICD-10-CM

## 2024-09-05 NOTE — Telephone Encounter (Signed)
 Copied from CRM #[id number]. Topic: Clinical - Medication Question >> Sep 05, 2024  3:13 PM Carmen Brown wrote: Reason for CRM: Patient called in stating she has a Pinched nerved from back to leg and Lauraine Pereyra  prescribed oxyCODONE -acetaminophen  (PERCOCET) 5-325 MG tablet for it and would like to know if she could get one more refill and pick up today before storm for both oxy and cyclobenzaprine  (FLEXERIL ) 5 MG tablet

## 2024-09-05 NOTE — Telephone Encounter (Signed)
 Copied from CRM [id number]. Topic: Clinical - Medication Refill >> Sep 05, 2024  3:13 PM Deaijah H wrote: Medication: cyclobenzaprine  (FLEXERIL ) 5 MG tablet  Has the patient contacted their pharmacy? Yes (Agent: If no, request that the patient contact the pharmacy for the refill. If patient does not wish to contact the pharmacy document the reason why and proceed with request.) Advised to call PCP (Agent: If yes, when and what did the pharmacy advise?)  This is the patient's preferred pharmacy:  [id number] - [place], [place] - [address] [place] [address] Phone: [phone] Fax: [phone]  Is this the correct pharmacy for this prescription? Yes If no, delete pharmacy and type the correct one.   Has the prescription been filled recently? Yes  Is the patient out of the medication? No  Has the patient been seen for an appointment in the last year OR does the patient have an upcoming appointment? Yes  Can we respond through MyChart? Yes  Agent: Please be advised that Rx refills may take up to 3 business days. We ask that you follow-up with your pharmacy.

## 2024-09-06 ENCOUNTER — Encounter: Payer: Self-pay | Admitting: Nurse Practitioner

## 2024-09-06 ENCOUNTER — Other Ambulatory Visit: Payer: Self-pay

## 2024-09-06 ENCOUNTER — Other Ambulatory Visit (HOSPITAL_COMMUNITY): Payer: Self-pay

## 2024-09-06 DIAGNOSIS — M5442 Lumbago with sciatica, left side: Secondary | ICD-10-CM

## 2024-09-06 MED ORDER — OXYCODONE-ACETAMINOPHEN 5-325 MG PO TABS: 1.0000 | ORAL_TABLET | Freq: Three times a day (TID) | ORAL | 0 refills | Status: DC | PRN

## 2024-09-06 MED ORDER — CYCLOBENZAPRINE HCL 5 MG PO TABS: 5.0000 mg | ORAL_TABLET | Freq: Three times a day (TID) | ORAL | 1 refills | Status: DC | PRN

## 2024-09-06 NOTE — Telephone Encounter (Signed)
 Pt request for medication refill was send in on 08/22/24

## 2024-09-06 NOTE — Telephone Encounter (Signed)
 I will send in refill of the cyclobenzaprine  5mg  tablets to be taken every 8 hours as needed and the oxycodone -acetaminophen  5-325mg  tablets to be taken every 8 hours as needed for pain. She needs to be advised to NOT TAKE BOTH MEDICATIONS AT THE SAME TIME. They should be spaced out from each other by at least 8 hours. If taken together risk of over sedation, respiratory depression (death), dizziness/falls increases. She should not drive or operate heavy machinery after taking these medications. She should not drink alcohol after taking these medications. She should not exceed 3000mg  of acetaminophen  from all sources in a 24 hour period. She will need to follow-up with her neurosurgeon moving forward for additional pain management while waiting for her surgery and after her surgery

## 2024-09-06 NOTE — Progress Notes (Signed)
 This encounter was created in error - please disregard.

## 2024-09-06 NOTE — Addendum Note (Signed)
 Addended by: ELNOR DOMINO E on: 09/06/2024 10:20 AM   Modules accepted: Orders

## 2024-09-09 ENCOUNTER — Ambulatory Visit: Admitting: Emergency Medicine

## 2024-09-09 ENCOUNTER — Other Ambulatory Visit (HOSPITAL_COMMUNITY): Payer: Self-pay

## 2024-09-09 NOTE — Telephone Encounter (Signed)
 Attempted to reach pt and inform her of PCP advise as follows I will send in refill of the cyclobenzaprine  5mg  tablets to be taken every 8 hours as needed and the oxycodone -acetaminophen  5-325mg  tablets to be taken every 8 hours as needed for pain. She needs to be advised to NOT TAKE BOTH MEDICATIONS AT THE SAME TIME. They should be spaced out from each other by at least 8 hours. If taken together risk of over sedation, respiratory depression (death), dizziness/falls increases. She should not drive or operate heavy machinery after taking these medications. She should not drink alcohol after taking these medications. She should not exceed 3000mg  of acetaminophen  from all sources in a 24 hour period. She will need to follow-up with her neurosurgeon moving forward for additional pain management while waiting for her surgery and after her surgery.

## 2024-09-10 DIAGNOSIS — I77810 Thoracic aortic ectasia: Secondary | ICD-10-CM | POA: Insufficient documentation

## 2024-09-10 NOTE — Progress Notes (Unsigned)
 " Cardiology Office Note:   Date:  09/12/2024  ID:  Carmen Brown, Carmen Brown [birth year], MRN [id number] PCP: Elnor Lauraine BRAVO, NP  [facility] Providers Cardiologist:  Lynwood Schilling, MD {  History of Present Illness:   Carmen Brown is a 84 y.o. female who presents for follow up of CAD.  She was admitted with [facility]  in Jan 2019.  She was found to have 95% LAD stenosis treated stent.  EF was normal.    When I saw her she in 2019 she had SOB and I ordered a POET (Plain Old Exercise Treadmill) which was negative for ischemia in August 2021.     Since I last saw her she has had back pain and she is going to have lumbar back surgery next week.  She is walking with a walker because of this.  With this level of activity she denies any cardiovascular symptoms. The patient denies any new symptoms such as chest discomfort, neck or arm discomfort. There has been no new shortness of breath, PND or orthopnea. There have been no reported palpitations, presyncope or syncope.    ROS: As stated in the HPI and negative for all other systems.  Studies Reviewed:    EKG:   EKG Interpretation Date/Time:  Thursday September 12 2024 13:51:13 EST Ventricular Rate:  89 PR Interval:  154 QRS Duration:  72 QT Interval:  354 QTC Calculation: 430 R Axis:   4  Text Interpretation: Sinus rhythm with sinus arrhythmia Nonspecific ST abnormality When compared with ECG of 06-Aug-2022 01:29, No significant change since last tracing Confirmed by Schilling Rattan ([id number]) on 09/12/2024 2:20:26 PM    Risk Assessment/Calculations:     Physical Exam:   VS:  BP (!) 152/84 (BP Location: Right Arm, Patient Position: Sitting, Cuff Size: Normal)   Pulse 87   Ht 5' 4 (1.626 m)   Wt 226 lb (102.5 kg)   SpO2 94%   BMI 38.79 kg/m    Wt Readings from Last 3 Encounters:  09/12/24 226 lb (102.5 kg)  08/22/24 221 lb 12.8 oz (100.6 kg)  06/27/24 222 lb 8 oz (100.9 kg)     GEN: Well nourished, well developed in no acute  distress NECK: No JVD; No carotid bruits CARDIAC: RRR, no murmurs, rubs, gallops RESPIRATORY:  Clear to auscultation without rales, wheezing or rhonchi  ABDOMEN: Soft, non-tender, non-distended EXTREMITIES:  No edema; No deformity   ASSESSMENT AND PLAN:   CAD:    The patient has no new sypmtoms.  No further cardiovascular testing is indicated.  We will continue with aggressive risk reduction and meds as listed.  HTN:  The blood pressure is elevated but this is unusual.  I reviewed some of the readings and is not typically this high.  No change in therapy at this point.  She will let me know and I will uptitrate meds if necessary based on future readings.   DYSLIPIDEMIA: LDL is 44 with an HDL of 44.  No change in therapy.    ASCENDING AORTIC ENLARGEMENT: This was 39 mm in October 2024.  I will plan a CT in the fall 2026 with contrast to evaluate aortic size.  PREOP: The patient has no high risk features and is not going for high risk surgery.  No further cardiovascular testing is necessary according to ACC/AHA guidelines preoperatively.  She is at acceptable risk from a cardiovascular standpoint for the planned procedure.  She is holding the aspirin  should resume this postop per  the operating provider.     Follow up with me in one year.   Signed, Lynwood Schilling, MD   "

## 2024-09-11 ENCOUNTER — Other Ambulatory Visit (HOSPITAL_COMMUNITY): Payer: Self-pay

## 2024-09-11 NOTE — Telephone Encounter (Signed)
 Error

## 2024-09-12 ENCOUNTER — Telehealth: Payer: Self-pay

## 2024-09-12 ENCOUNTER — Ambulatory Visit: Attending: Cardiology | Admitting: Cardiology

## 2024-09-12 ENCOUNTER — Encounter: Payer: Self-pay | Admitting: Cardiology

## 2024-09-12 VITALS — BP 152/84 | HR 87 | Ht 64.0 in | Wt 226.0 lb

## 2024-09-12 DIAGNOSIS — I2510 Atherosclerotic heart disease of native coronary artery without angina pectoris: Secondary | ICD-10-CM | POA: Diagnosis not present

## 2024-09-12 DIAGNOSIS — E785 Hyperlipidemia, unspecified: Secondary | ICD-10-CM

## 2024-09-12 DIAGNOSIS — I10 Essential (primary) hypertension: Secondary | ICD-10-CM | POA: Diagnosis not present

## 2024-09-12 DIAGNOSIS — I77810 Thoracic aortic ectasia: Secondary | ICD-10-CM

## 2024-09-12 NOTE — Progress Notes (Signed)
 Surgical Instructions   Your procedure is scheduled on September 18, 2024. Report to [facility] Main Entrance A at 11:00 A.M., then check in with the Admitting office. Any questions or running late day of surgery: call [phone]  Questions prior to your surgery date: call [phone], Monday-Friday, 8am-4pm. If you experience any cold or flu symptoms such as cough, fever, chills, shortness of breath, etc. between now and your scheduled surgery, please notify us  at the above number.     Remember:  Do not eat after midnight the night before your surgery  You may drink clear liquids until 10:00 the morning of your surgery.   Clear liquids allowed are: Water, Non-Citrus Juices (without pulp), Carbonated Beverages, Clear Tea (no milk, honey, etc.), Black Coffee Only (NO MILK, CREAM OR POWDERED CREAMER of any kind), and Gatorade.    Take these medicines the morning of surgery with A SIP OF WATER  amLODipine  (NORVASC )  atorvastatin  (LIPITOR )  loratadine (CLARITIN)  metoprolol  succinate (TOPROL -XL)    May take these medicines IF NEEDED: cyclobenzaprine  (FLEXERIL )  nitroGLYCERIN  (NITROSTAT ) PLEASE CALL [phone] IF USED PRIOR TO SURGERY DATE oxyCODONE -acetaminophen  (PERCOCET)   Aspirin  PLEASE FOLLOW INSTRUCTIONS GIVEN YOUR SURGEON. IF NONE PLEASE REACH OUT TO OFFICE.  One week prior to surgery, STOP taking any Aspirin  (unless otherwise instructed by your surgeon) Aleve, Naproxen, Ibuprofen, Motrin, Advil, Goody's, BC's, all herbal medications, fish oil, and non-prescription vitamins.                     Do NOT Smoke (Tobacco/Vaping) for 24 hours prior to your procedure.  If you use a CPAP at night, you may bring your mask/headgear for your overnight stay.   You will be asked to remove any contacts, glasses, piercing's, hearing aid's, dentures/partials prior to surgery. Please bring cases for these items if needed.    Your surgeon will determine if you are to be admitted or  discharged the same day.  Patients discharged the day of surgery will not be allowed to drive home, and someone needs to stay with them for 24 hours.  SURGICAL WAITING ROOM VISITATION Patients may have no more than 2 support people in the waiting area - these visitors may rotate.   Pre-op nurse will coordinate an appropriate time for 2 ADULT support persons, who may not rotate, to accompany patient in pre-op.  Children under the age of 16 must have an adult with them who is not the patient and must remain in the main waiting area with an adult.  If the patient needs to stay at the hospital during part of their recovery, the visitor guidelines for inpatient rooms apply.  Please refer to the [facility] website for the visitor guidelines for any additional information.   If you received a COVID test during your pre-op visit  it is requested that you wear a mask when out in public, stay away from anyone that may not be feeling well and notify your surgeon if you develop symptoms. If you have been in contact with anyone that has tested positive in the last 10 days please notify you surgeon.      Pre-operative 4 CHG Bathing Instructions   You can play a key role in reducing the risk of infection after surgery. Your skin needs to be as free of germs as possible. You can reduce the number of germs on your skin by washing with CHG (chlorhexidine gluconate) soap before surgery. CHG is an antiseptic soap that kills germs  and continues to kill germs even after washing.   DO NOT use if you have an allergy to chlorhexidine/CHG or antibacterial soaps. If your skin becomes reddened or irritated, stop using the CHG and notify one of our RNs at [phone].   Please shower with the CHG soap starting 4 days before surgery using the following schedule:     Please keep in mind the following:  DO NOT shave, including legs and underarms, starting the day of your first shower.   You may shave your face at any  point before/day of surgery.  Place clean sheets on your bed the day you start using CHG soap. Use a clean washcloth (not used since being washed) for each shower. DO NOT sleep with pets once you start using the CHG.   CHG Shower Instructions:  Wash your face and private area with normal soap. If you choose to wash your hair, wash first with your normal shampoo.  After you use shampoo/soap, rinse your hair and body thoroughly to remove shampoo/soap residue.  Turn the water OFF and apply  bottle of CHG soap to a CLEAN washcloth.  Apply CHG soap ONLY FROM YOUR NECK DOWN TO YOUR TOES (washing for 3-5 minutes)  DO NOT use CHG soap on face, private areas, open wounds, or sores.  Pay special attention to the area where your surgery is being performed.  If you are having back surgery, having someone wash your back for you may be helpful. Wait 2 minutes after CHG soap is applied, then you may rinse off the CHG soap.  Pat dry with a clean towel  Put on clean clothes/pajamas   If you choose to wear lotion, please use ONLY the CHG-compatible lotions that are listed below.  Additional instructions for the day of surgery:  If you choose, you may shower the morning of surgery with an antibacterial soap.  DO NOT APPLY any lotions, deodorants, cologne, or perfumes.   Do not bring valuables to the hospital. [facility] is not responsible for any belongings/valuables. Do not wear nail polish, gel polish, artificial nails, or any other type of covering on natural nails (fingers and toes) Do not wear jewelry or makeup Put on clean/comfortable clothes.  Please brush your teeth.  Ask your nurse before applying any prescription medications to the skin.     CHG Compatible Lotions   Aveeno Moisturizing lotion  Cetaphil Moisturizing Cream  Cetaphil Moisturizing Lotion  Clairol Herbal Essence Moisturizing Lotion, Dry Skin  Clairol Herbal Essence Moisturizing Lotion, Extra Dry Skin  Clairol Herbal  Essence Moisturizing Lotion, Normal Skin  Curel Age Defying Therapeutic Moisturizing Lotion with Alpha Hydroxy  Curel Extreme Care Body Lotion  Curel Soothing Hands Moisturizing Hand Lotion  Curel Therapeutic Moisturizing Cream, Fragrance-Free  Curel Therapeutic Moisturizing Lotion, Fragrance-Free  Curel Therapeutic Moisturizing Lotion, Original Formula  Eucerin Daily Replenishing Lotion  Eucerin Dry Skin Therapy Plus Alpha Hydroxy Crme  Eucerin Dry Skin Therapy Plus Alpha Hydroxy Lotion  Eucerin Original Crme  Eucerin Original Lotion  Eucerin Plus Crme Eucerin Plus Lotion  Eucerin TriLipid Replenishing Lotion  Keri Anti-Bacterial Hand Lotion  Keri Deep Conditioning Original Lotion Dry Skin Formula Softly Scented  Keri Deep Conditioning Original Lotion, Fragrance Free Sensitive Skin Formula  Keri Lotion Fast Absorbing Fragrance Free Sensitive Skin Formula  Keri Lotion Fast Absorbing Softly Scented Dry Skin Formula  Keri Original Lotion  Keri Skin Renewal Lotion Keri Silky Smooth Lotion  Keri Silky Smooth Sensitive Skin Lotion  Nivea Body Creamy  Conditioning Oil  Nivea Body Extra Enriched Teacher, Adult Education Moisturizing Lotion Nivea Crme  Nivea Skin Firming Lotion  NutraDerm 30 Skin Lotion  NutraDerm Skin Lotion  NutraDerm Therapeutic Skin Cream  NutraDerm Therapeutic Skin Lotion  ProShield Protective Hand Cream  Provon moisturizing lotion  Please read over the following fact sheets that you were given.

## 2024-09-12 NOTE — Patient Instructions (Signed)
 Medication Instructions:  Your physician recommends that you continue on your current medications as directed. Please refer to the Current Medication list given to you today.  *If you need a refill on your cardiac medications before your next appointment, please call your pharmacy*  Lab Work: NONE If you have labs (blood work) drawn today and your tests are completely normal, you will receive your results only by: MyChart Message (if you have MyChart) OR A paper copy in the mail If you have any lab test that is abnormal or we need to change your treatment, we will call you to review the results.  Testing/Procedures: CT Angio of Aorta in Aug of 2026  Follow-Up: At [facility], you and your health needs are our priority.  As part of our continuing mission to provide you with exceptional heart care, our providers are all part of one team.  This team includes your primary Cardiologist (physician) and Advanced Practice Providers or APPs (Physician Assistants and Nurse Practitioners) who all work together to provide you with the care you need, when you need it.  Your next appointment:   1 year(s)  Provider:   Lynwood Schilling, MD    We recommend signing up for the patient portal called MyChart.  Sign up information is provided on this After Visit Summary.  MyChart is used to connect with patients for Virtual Visits (Telemedicine).  Patients are able to view lab/test results, encounter notes, upcoming appointments, etc.  Non-urgent messages can be sent to your provider as well.   To learn more about what you can do with MyChart, go to [email or web address].

## 2024-09-12 NOTE — Telephone Encounter (Signed)
 Copied from CRM [id number]. Topic: Clinical - Medication Question >> Sep 12, 2024 10:57 AM Robinson H wrote: Reason for CRM: Having back surgery next Wednesday and was informed she should let her providers know, surgery is on L4 vertebrae. Taking an over the counter dietary supplement medication called pain away that contains glucosamine, hydrochloride, chondroitin sulfate, methylsulfonylmethane, hyaluronic acid, willow bark, green tea, aloe vera, ginger, harpagophyte already has stopped her aspirin  and wants to know if she should stop that over the counter medication as well and when. Also, also wants to know about stopping the oxyCODONE -acetaminophen  (PERCOCET) 5-325 MG tablet before surgery as well.  Jing [phone]

## 2024-09-13 ENCOUNTER — Encounter (HOSPITAL_COMMUNITY)
Admission: RE | Admit: 2024-09-13 | Discharge: 2024-09-13 | Disposition: A | Source: Ambulatory Visit | Attending: Neurological Surgery

## 2024-09-13 ENCOUNTER — Other Ambulatory Visit: Payer: Self-pay

## 2024-09-13 ENCOUNTER — Encounter (HOSPITAL_COMMUNITY): Payer: Self-pay

## 2024-09-13 VITALS — BP 153/79 | HR 64 | Temp 98.4°F | Resp 18 | Ht 64.0 in | Wt 227.0 lb

## 2024-09-13 DIAGNOSIS — Z01812 Encounter for preprocedural laboratory examination: Secondary | ICD-10-CM | POA: Diagnosis present

## 2024-09-13 DIAGNOSIS — Z01818 Encounter for other preprocedural examination: Secondary | ICD-10-CM

## 2024-09-13 HISTORY — DX: Dyspnea, unspecified: R06.00

## 2024-09-13 HISTORY — DX: Prediabetes: R73.03

## 2024-09-13 LAB — CBC
HCT: 39.4 % (ref 36.0–46.0)
Hemoglobin: 13.4 g/dL (ref 12.0–15.0)
MCH: 33.7 pg (ref 26.0–34.0)
MCHC: 34 g/dL (ref 30.0–36.0)
MCV: 99 fL (ref 80.0–100.0)
Platelets: 288 10*3/uL (ref 150–400)
RBC: 3.98 MIL/uL (ref 3.87–5.11)
RDW: 12.7 % (ref 11.5–15.5)
WBC: 11.9 10*3/uL — ABNORMAL HIGH (ref 4.0–10.5)
nRBC: 0 % (ref 0.0–0.2)

## 2024-09-13 LAB — TYPE AND SCREEN
ABO/RH(D): O POS
Antibody Screen: NEGATIVE

## 2024-09-13 LAB — BASIC METABOLIC PANEL WITH GFR
Anion gap: 9 (ref 5–15)
BUN: 28 mg/dL — ABNORMAL HIGH (ref 8–23)
CO2: 25 mmol/L (ref 22–32)
Calcium: 9.2 mg/dL (ref 8.9–10.3)
Chloride: 103 mmol/L (ref 98–111)
Creatinine, Ser: 0.95 mg/dL (ref 0.44–1.00)
GFR, Estimated: 59 mL/min — ABNORMAL LOW
Glucose, Bld: 114 mg/dL — ABNORMAL HIGH (ref 70–99)
Potassium: 4.6 mmol/L (ref 3.5–5.1)
Sodium: 138 mmol/L (ref 135–145)

## 2024-09-13 LAB — SURGICAL PCR SCREEN
MRSA, PCR: NEGATIVE
Staphylococcus aureus: POSITIVE — AB

## 2024-09-13 NOTE — Progress Notes (Signed)
" °   09/13/24 1403  OBSTRUCTIVE SLEEP APNEA  Have you ever been diagnosed with sleep apnea through a sleep study? No  Do you snore loudly (loud enough to be heard through closed doors)?  1  Do you often feel tired, fatigued, or sleepy during the daytime (such as falling asleep during driving or talking to someone)? 0  Has anyone observed you stop breathing during your sleep? 0  Do you have, or are you being treated for high blood pressure? 1  BMI more than 35 kg/m2? 1  Age > 50 (1-yes) 1  Neck circumference greater than:Female 16 inches or larger, Female 17inches or larger? 1  Female Gender (Yes=1) 0  Obstructive Sleep Apnea Score 5  Score 5 or greater  Results sent to PCP    "

## 2024-09-16 ENCOUNTER — Encounter (HOSPITAL_COMMUNITY): Payer: Self-pay

## 2024-09-16 ENCOUNTER — Other Ambulatory Visit (HOSPITAL_COMMUNITY): Payer: Self-pay

## 2024-09-16 ENCOUNTER — Telehealth: Payer: Self-pay

## 2024-09-16 ENCOUNTER — Ambulatory Visit: Payer: Self-pay | Admitting: Nurse Practitioner

## 2024-09-16 DIAGNOSIS — M5442 Lumbago with sciatica, left side: Secondary | ICD-10-CM

## 2024-09-16 MED ORDER — OXYCODONE-ACETAMINOPHEN 5-325 MG PO TABS: 1.0000 | ORAL_TABLET | Freq: Three times a day (TID) | ORAL | 0 refills | Status: DC | PRN

## 2024-09-16 NOTE — Telephone Encounter (Signed)
 FYI Only or Action Required?: Action required by provider: medication refill request. Pt is requesting a refill of pain medication to last until sx. Pt is taking every 8 hours. Patient was last seen in primary care on 08/22/2024 by Elnor Lauraine BRAVO, NP.  Called Nurse Triage reporting Back Pain.  Symptoms began 1 year ago after a fall - worsening sx in 2 weeks.  Interventions attempted: Other: everything.  Symptoms are: gradually worsening.  Triage Disposition: See PCP Within 2 Weeks  Patient/caregiver understands and will follow disposition?: Yes                                  Reason for Triage: Pt  has a pinched nerve in her back that is causing severe pain that's radiating around the groan area down the leg and the butt area on the left side. Pt has surgery wed 2/4 for this issue but is still in severe pain and is out of pain meds.  Reason for Disposition  Back pain is a chronic symptom (recurrent or ongoing AND present > 4 weeks)  Answer Assessment - Initial Assessment Questions 1. ONSET: When did the pain begin? (e.g., minutes, hours, days)     After Christmas pain has increased dramtically 2. LOCATION: Where does it hurt? (upper, mid or lower back)     Lower back 3. SEVERITY: How bad is the pain?  (e.g., Scale 1-10; mild, moderate, or severe)     10/10 4. PATTERN: Is the pain constant? (e.g., yes, no; constant, intermittent)      yes 5. RADIATION: Does the pain shoot into your legs or somewhere else?     Yes down left leg 6. CAUSE:  What do you think is causing the back pain?      Pinched nerve  8. MEDICINES: What have you taken so far for the pain? (e.g., nothing, acetaminophen , NSAIDS)     Pt is requesting a refill of pain medications as pain has increased 9. NEUROLOGIC SYMPTOMS: Do you have any weakness, numbness, or problems with bowel/bladder control?     no 10. OTHER SYMPTOMS: Do you have any other symptoms? (e.g., fever,  abdomen pain, burning with urination, blood in urine)       no  Protocols used: Back Pain-A-AH

## 2024-09-16 NOTE — Telephone Encounter (Signed)
 Refill approved and sent to pharmacy.

## 2024-09-16 NOTE — Telephone Encounter (Signed)
 Pharmacy Patient Advocate Encounter   Received notification from [facility] KEY that prior authorization for Norco 5-325 is required/requested.   Insurance verification completed.   The patient is insured through [facility].   Per test claim: PA required; PA submitted to above mentioned insurance via Latent Key/confirmation #/EOC [id number] Status is pending

## 2024-09-16 NOTE — Progress Notes (Signed)
 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 BMI is consistent with obesity. OSA screening score of 5.   In 06/2007 she had LHC for evaluation of monomorphic VT. Results showed normal coronaries, EF 45-50%, global hypokinesis, worse in the inferior wall. Amiodarone initiated with recovery of LVEF. She underwent another cardiac cath 09/08/2017 for evaluation of unstable angina which revealed normal LVF, single vessel LAD stenosis s/p overlapping DESx2 proximal LAD. She also had mild-moderate ramus and LCX disease. She had a negative ETT in August 2021. 03/2023 monitor showed SR, 6 beat run SVT, no sustained arrhythmias. TTE 04/2023 showed LVEF 65-70%, no RWMA, mild asymmetric LVH of the basal-septal segment, grade 1 DD, normal RV systolic function, trivial MR, 42 mm ascending aorta. She is no longer on amiodarone, but meds include amlodipine , ASA 81 mg, lisinopril . Toprol  XL.   Last cardiology visit with Dr. Lavona was on 09/13/2023. No new symptoms. Known SOB, but changes, PND, or orthopnea. BP elevated, but historically controlled, so no changes made asked that he let him know if home BP trends remain elevated. He  planned to check chest CT in Fall 2026 to re-evaluate ascending aorta (39 mm in 05/2023). In regards to surgery, he wrote, PREOP: The patient has no high risk features and is not going for high risk surgery.  No further cardiovascular testing is necessary according to ACC/AHA guidelines preoperatively.  She is at acceptable risk from a cardiovascular standpoint for the planned procedure.  She is holding the aspirin  should resume this postop per the operating provider.   Anesthesia team to evaluate on the day of surgery. Last ASA reported as 09/10/2024.   VS: BP (!) 153/79   Pulse 64   Temp 36.9 C   Resp 18   Ht 5' 4 (1.626 m)   Wt 103 kg   SpO2 97%   BMI 38.96 kg/m    PROVIDERS: Elnor Lauraine BRAVO, NP is PCP  Lavona Agent, MD is cardiologist  Jeannetta Bruckner, MD is rheumatologist Tommas Pears, MD is endocrinologist   LABS: Labs reviewed: Acceptable for surgery. (all labs ordered are listed, but only abnormal results are displayed)  Labs Reviewed  SURGICAL PCR SCREEN - Abnormal; Notable for the following components:      Result Value   Staphylococcus aureus POSITIVE (*)    All other components within normal limits  BASIC METABOLIC PANEL WITH GFR - Abnormal; Notable for the following components:   Glucose, Bld 114 (*)    BUN 28 (*)    GFR, Estimated 59 (*)    All other components within normal limits  CBC - Abnormal; Notable for the following components:   WBC 11.9 (*)    All other components within normal limits  TYPE AND SCREEN   TSH 2.770, A1c 5.7% 05/24/2024.  IMAGES: MRI L-spine 121/27/2025: IMPRESSION: 1. Potential impingement of the traversing left L5 nerve root at L4-L5, slightly increased from prior. 2. Moderate spinal canal stenosis at L2-L3, slightly increased from prior. 3. No bone marrow edema or evidence of acute fracture. 4. Possible impingement of the traversing right S1 nerve root at L5-S1, similar to prior. 5. Additional degenerative  changes as above.  CT Chest 01/11/2024: IMPRESSION: - No acute abnormality noted. - The aortocaval node seen previously is not included on this image set. CT of the abdomen and pelvis would be required to cover the aortocaval area. - Cholelithiasis without complicating factors. - Aortic Atherosclerosis (ICD10-I70.0).   EKG: 09/12/2024: Sinus rhythm with sinus arrhythmia Nonspecific ST abnormality When compared with ECG of 06-Aug-2022 01:29, No significant change since last tracing Confirmed by Lavona Rattan ([id number]) on 09/12/2024 2:20:26 PM   CV: Echo 04/28/2023: IMPRESSIONS   1. Left ventricular ejection fraction, by estimation, is 65 to 70%. Left  ventricular ejection fraction by PLAX is 68 %. The left ventricle has  normal function. The left ventricle has no regional wall motion  abnormalities. There is mild asymmetric left  ventricular hypertrophy of the basal-septal segment. Left ventricular  diastolic parameters are consistent with Grade I diastolic dysfunction  (impaired relaxation).   2. Right ventricular systolic function is normal. The right ventricular  size is normal.   3. The mitral valve is abnormal. Trivial mitral valve regurgitation.   4. The aortic valve is tricuspid. Aortic valve regurgitation is not  visualized. Aortic valve sclerosis/calcification is present, without any  evidence of aortic stenosis.   5. Aortic dilatation noted. There is mild dilatation of the ascending  aorta, measuring 42 mm.  - Comparison(s): Approximately 2 second sinus pause was noted during the  exam (image 36).    Long term monitor 04/10/2023 - 04/12/2023: The predominant rhythm was normal sinus.  There was 1 run of supraventricular tachycardia lasting 6 beats.   There were no sustained arrhythmias. No symptomatic arrhythmias.   ETT 04/08/2020: Blood pressure demonstrated a hypertensive response to exercise. There was < 1mm of J point depression with upsloping ST segments in the  inferolateral leads Poor exercise capacity. The patient exercised for 3 minutes achieving only 5 mets at 98% MPHR. The test was stopped due to SOB. This is an adequate exercise stress test that is negative for ischemia.   US  Carotid 04/10/2018: IMPRESSION: Color duplex indicates minimal heterogeneous and calcified plaque, with no hemodynamically significant stenosis by duplex criteria in the extracranial cerebrovascular circulation.   Cardiac cath 09/08/2017: Conclusions: Severe single-vessel coronary artery disease with sequential 95% and 50% proximal LAD stenoses. Mild to moderate, nonobstructive coronary artery disease involving the ramus intermedius, LCx, and RCA. Mildly elevated left ventricular filling pressure with normal to hyperdynamic left ventricular contraction. Successful PCI to the proximal/mid LAD with placement of overlapping Xience Sierra 3.0 x 28 mm (proximal) and 2.75 x 12 mm (distal) drug-eluting stents.  Distal stent was placed due to concern for distal edge dissection after initial stent deployment.  Final angiogram demonstrates 0% residual stenosis with TIMI-3 flow.   Recommendations: Dual antiplatelet therapy with aspirin  and ticagrelor  for at least 12 months. Aggressive secondary prevention, including high intensity statin therapy.  Past Medical History:  Diagnosis Date   Allergy    Arthritis    CAD (coronary artery disease)    95% proximal LAD stenosis with DES.  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   Thyroid  disease     Past Surgical History:  Procedure Laterality Date   CARPAL TUNNEL RELEASE Right 11/2016   CATARACT EXTRACTION  2020   Per patient   LEFT HEART CATH AND CORONARY  ANGIOGRAPHY N/A 09/08/2017   Procedure: LEFT HEART CATH AND CORONARY ANGIOGRAPHY;  Surgeon: Mady Bruckner, MD;  [facility];  Service: Cardiovascular;  Laterality: N/A;   MASS EXCISION  03/15/2012   Procedure: MINOR EXCISION OF MASS;  Surgeon: Lamar LULLA Leonor Mickey., MD;  Location: [facility];  Service: Orthopedics;  Laterality: Right;  debride IP right long finger, excision mucoid cyst   TONSILLECTOMY     as a child, under age 57   ventricular tacticartia      MEDICATIONS:  amLODipine  (NORVASC ) 10 MG tablet   aspirin  EC 81 MG tablet   atorvastatin  (LIPITOR ) 40 MG tablet   cyclobenzaprine  (FLEXERIL ) 5 MG tablet   lisinopril  (ZESTRIL ) 20 MG tablet   loratadine (CLARITIN) 10 MG tablet   metoprolol  succinate (TOPROL -XL) 25 MG 24 hr tablet   nitroGLYCERIN  (NITROSTAT ) 0.4 MG SL tablet   OVER THE COUNTER MEDICATION   oxyCODONE -acetaminophen  (PERCOCET) 5-325 MG tablet   No current facility-administered medications for this encounter.    Isaiah Ruder, PA-C Surgical Short Stay/Anesthesiology [facility] Phone [phone] [facility] Phone [phone] 09/16/2024 9:39 PM

## 2024-09-16 NOTE — Telephone Encounter (Signed)
 Pharmacy Patient Advocate Encounter  Received notification from [facility] that Prior Authorization for Percocet  has been APPROVED from 09/16/24 to 03/15/25. Ran test claim, Copay is $0.75. This test claim was processed through [facility]- copay amounts may vary at other pharmacies due to pharmacy/plan contracts, or as the patient moves through the different stages of their insurance plan.

## 2024-09-16 NOTE — Telephone Encounter (Deleted)
 Pharmacy Patient Advocate Encounter  Received notification from [employer] that Prior Authorization for Norco 5-325 has been APPROVED from 09/16/24 to 03/15/25   PA #/Case ID/Reference #: # [id number]

## 2024-09-18 ENCOUNTER — Ambulatory Visit (HOSPITAL_COMMUNITY)

## 2024-09-18 ENCOUNTER — Ambulatory Visit (HOSPITAL_COMMUNITY): Admission: RE | Admit: 2024-09-18 | Admitting: Neurological Surgery

## 2024-09-18 ENCOUNTER — Encounter (HOSPITAL_COMMUNITY): Payer: Self-pay | Admitting: Neurological Surgery

## 2024-09-18 ENCOUNTER — Encounter (HOSPITAL_COMMUNITY): Admission: RE | Disposition: A | Payer: Self-pay | Source: Home / Self Care | Attending: Neurological Surgery

## 2024-09-18 ENCOUNTER — Encounter (HOSPITAL_COMMUNITY): Payer: Self-pay | Admitting: Vascular Surgery

## 2024-09-18 DIAGNOSIS — Z981 Arthrodesis status: Principal | ICD-10-CM

## 2024-09-18 DIAGNOSIS — M5442 Lumbago with sciatica, left side: Secondary | ICD-10-CM

## 2024-09-18 LAB — ABO/RH: ABO/RH(D): O POS

## 2024-09-18 MED ORDER — METOPROLOL SUCCINATE ER 25 MG PO TB24: 25.0000 mg | ORAL_TABLET | Freq: Every day | ORAL | Status: DC

## 2024-09-18 MED ORDER — THROMBIN 5000 UNITS EX KIT
PACK | CUTANEOUS | Status: AC
  Filled 2024-09-18: qty 1

## 2024-09-18 MED ORDER — FENTANYL CITRATE (PF) 250 MCG/5ML IJ SOLN
INTRAMUSCULAR | Status: DC | PRN
  Administered 2024-09-18 (×4): 50 ug via INTRAVENOUS

## 2024-09-18 MED ORDER — ORAL CARE MOUTH RINSE: 15.0000 mL | Freq: Once | OROMUCOSAL | Status: AC

## 2024-09-18 MED ORDER — LIDOCAINE 2% (20 MG/ML) 5 ML SYRINGE
INTRAMUSCULAR | Status: AC
  Filled 2024-09-18: qty 5

## 2024-09-18 MED ORDER — ONDANSETRON HCL 4 MG/2ML IJ SOLN
INTRAMUSCULAR | Status: AC
  Filled 2024-09-18: qty 2

## 2024-09-18 MED ORDER — DEXAMETHASONE SOD PHOSPHATE PF 10 MG/ML IJ SOLN
INTRAMUSCULAR | Status: DC | PRN
  Administered 2024-09-18: 5 mg via INTRAVENOUS

## 2024-09-18 MED ORDER — LISINOPRIL 10 MG PO TABS
5.0000 mg | ORAL_TABLET | Freq: Every day | ORAL | Status: DC
  Administered 2024-09-18: 5 mg via ORAL
  Filled 2024-09-18: qty 1

## 2024-09-18 MED ORDER — BUPIVACAINE HCL (PF) 0.25 % IJ SOLN
INTRAMUSCULAR | Status: AC
  Filled 2024-09-18: qty 30

## 2024-09-18 MED ORDER — SODIUM CHLORIDE 0.9 % IV SOLN: 250.0000 mL | INTRAVENOUS | Status: DC

## 2024-09-18 MED ORDER — FENTANYL CITRATE (PF) 100 MCG/2ML IJ SOLN
INTRAMUSCULAR | Status: AC
  Filled 2024-09-18: qty 2

## 2024-09-18 MED ORDER — CHLORHEXIDINE GLUCONATE CLOTH 2 % EX PADS
6.0000 | MEDICATED_PAD | Freq: Once | CUTANEOUS | Status: AC
  Administered 2024-09-18: 6 via TOPICAL

## 2024-09-18 MED ORDER — ACETAMINOPHEN 325 MG PO TABS: 650.0000 mg | ORAL_TABLET | ORAL | Status: DC | PRN

## 2024-09-18 MED ORDER — AMLODIPINE BESYLATE 10 MG PO TABS: 10.0000 mg | ORAL_TABLET | Freq: Every day | ORAL | Status: DC

## 2024-09-18 MED ORDER — ACETAMINOPHEN 500 MG PO TABS
1000.0000 mg | ORAL_TABLET | ORAL | Status: AC
  Administered 2024-09-18: 1000 mg via ORAL
  Filled 2024-09-18: qty 2

## 2024-09-18 MED ORDER — BUPIVACAINE HCL (PF) 0.25 % IJ SOLN
INTRAMUSCULAR | Status: DC | PRN
  Administered 2024-09-18: 10 mL

## 2024-09-18 MED ORDER — MUPIROCIN 2 % EX OINT: 1.0000 | TOPICAL_OINTMENT | Freq: Two times a day (BID) | CUTANEOUS | 0 refills | Status: AC

## 2024-09-18 MED ORDER — SENNA 8.6 MG PO TABS
1.0000 | ORAL_TABLET | Freq: Two times a day (BID) | ORAL | Status: DC
  Administered 2024-09-18: 8.6 mg via ORAL
  Filled 2024-09-18: qty 1

## 2024-09-18 MED ORDER — 0.9 % SODIUM CHLORIDE (POUR BTL) OPTIME
TOPICAL | Status: DC | PRN
  Administered 2024-09-18: 1000 mL

## 2024-09-18 MED ORDER — ONDANSETRON HCL 4 MG/2ML IJ SOLN: 4.0000 mg | Freq: Once | INTRAMUSCULAR | Status: DC | PRN

## 2024-09-18 MED ORDER — AMISULPRIDE (ANTIEMETIC) 5 MG/2ML IV SOLN: 10.0000 mg | Freq: Once | INTRAVENOUS | Status: DC | PRN

## 2024-09-18 MED ORDER — PHENYLEPHRINE HCL-NACL 20-0.9 MG/250ML-% IV SOLN
INTRAVENOUS | Status: DC | PRN
  Administered 2024-09-18: 30 ug/min via INTRAVENOUS

## 2024-09-18 MED ORDER — HYDROMORPHONE HCL 1 MG/ML IJ SOLN
INTRAMUSCULAR | Status: AC
  Filled 2024-09-18: qty 1

## 2024-09-18 MED ORDER — DEXAMETHASONE 4 MG PO TABS
4.0000 mg | ORAL_TABLET | Freq: Four times a day (QID) | ORAL | Status: DC
  Administered 2024-09-18: 4 mg via ORAL
  Filled 2024-09-18: qty 1

## 2024-09-18 MED ORDER — DEXAMETHASONE SODIUM PHOSPHATE 4 MG/ML IJ SOLN
4.0000 mg | Freq: Four times a day (QID) | INTRAMUSCULAR | Status: DC
  Administered 2024-09-18: 4 mg via INTRAVENOUS
  Filled 2024-09-18: qty 1

## 2024-09-18 MED ORDER — DEXAMETHASONE SOD PHOSPHATE PF 10 MG/ML IJ SOLN
INTRAMUSCULAR | Status: AC
  Filled 2024-09-18: qty 1

## 2024-09-18 MED ORDER — ACETAMINOPHEN 650 MG RE SUPP: 650.0000 mg | RECTAL | Status: DC | PRN

## 2024-09-18 MED ORDER — ACETAMINOPHEN 10 MG/ML IV SOLN
INTRAVENOUS | Status: AC
  Filled 2024-09-18: qty 100

## 2024-09-18 MED ORDER — THROMBIN 20000 UNITS EX SOLR
CUTANEOUS | Status: DC | PRN
  Administered 2024-09-18: 20 mL via TOPICAL

## 2024-09-18 MED ORDER — CHLORHEXIDINE GLUCONATE CLOTH 2 % EX PADS: 6.0000 | MEDICATED_PAD | Freq: Once | CUTANEOUS | Status: DC

## 2024-09-18 MED ORDER — ONDANSETRON HCL 4 MG/2ML IJ SOLN
INTRAMUSCULAR | Status: DC | PRN
  Administered 2024-09-18: 4 mg via INTRAVENOUS

## 2024-09-18 MED ORDER — LIDOCAINE 2% (20 MG/ML) 5 ML SYRINGE
INTRAMUSCULAR | Status: DC | PRN
  Administered 2024-09-18: 100 mg via INTRAVENOUS

## 2024-09-18 MED ORDER — OXYCODONE HCL 5 MG PO TABS: 5.0000 mg | ORAL_TABLET | Freq: Once | ORAL | Status: DC | PRN

## 2024-09-18 MED ORDER — SURGIRINSE WOUND IRRIGATION SYSTEM - OPTIME: TOPICAL | Status: DC | PRN

## 2024-09-18 MED ORDER — MENTHOL 3 MG MT LOZG: 1.0000 | LOZENGE | OROMUCOSAL | Status: DC | PRN

## 2024-09-18 MED ORDER — OXYCODONE HCL 5 MG/5ML PO SOLN: 5.0000 mg | Freq: Once | ORAL | Status: DC | PRN

## 2024-09-18 MED ORDER — EPHEDRINE SULFATE-NACL 50-0.9 MG/10ML-% IV SOSY
PREFILLED_SYRINGE | INTRAVENOUS | Status: DC | PRN
  Administered 2024-09-18: 10 mg via INTRAVENOUS

## 2024-09-18 MED ORDER — CYCLOBENZAPRINE HCL 5 MG PO TABS
5.0000 mg | ORAL_TABLET | Freq: Three times a day (TID) | ORAL | Status: DC | PRN
  Administered 2024-09-18: 5 mg via ORAL
  Filled 2024-09-18: qty 1

## 2024-09-18 MED ORDER — CHLORHEXIDINE GLUCONATE 4 % EX SOLN: 1.0000 | CUTANEOUS | 1 refills | Status: AC

## 2024-09-18 MED ORDER — GABAPENTIN 300 MG PO CAPS
300.0000 mg | ORAL_CAPSULE | ORAL | Status: DC
  Filled 2024-09-18: qty 1

## 2024-09-18 MED ORDER — THROMBIN 20000 UNITS EX SOLR
CUTANEOUS | Status: AC
  Filled 2024-09-18: qty 20000

## 2024-09-18 MED ORDER — ACETAMINOPHEN 10 MG/ML IV SOLN
1000.0000 mg | Freq: Once | INTRAVENOUS | Status: DC | PRN
  Administered 2024-09-18: 1000 mg via INTRAVENOUS

## 2024-09-18 MED ORDER — CELECOXIB 200 MG PO CAPS
200.0000 mg | ORAL_CAPSULE | Freq: Two times a day (BID) | ORAL | Status: DC
  Administered 2024-09-18: 200 mg via ORAL
  Filled 2024-09-18: qty 1

## 2024-09-18 MED ORDER — SODIUM CHLORIDE 0.9% FLUSH: 3.0000 mL | Freq: Two times a day (BID) | INTRAVENOUS | Status: DC

## 2024-09-18 MED ORDER — OXYCODONE-ACETAMINOPHEN 5-325 MG PO TABS
1.0000 | ORAL_TABLET | ORAL | Status: DC | PRN
  Administered 2024-09-18 – 2024-09-19 (×2): 1 via ORAL
  Filled 2024-09-18 (×2): qty 1

## 2024-09-18 MED ORDER — ONDANSETRON HCL 4 MG/2ML IJ SOLN: 4.0000 mg | Freq: Four times a day (QID) | INTRAMUSCULAR | Status: DC | PRN

## 2024-09-18 MED ORDER — PHENOL 1.4 % MT LIQD: 1.0000 | OROMUCOSAL | Status: DC | PRN

## 2024-09-18 MED ORDER — ALBUMIN HUMAN 5 % IV SOLN: INTRAVENOUS | Status: DC | PRN

## 2024-09-18 MED ORDER — SODIUM CHLORIDE 0.9% FLUSH: 3.0000 mL | INTRAVENOUS | Status: DC | PRN

## 2024-09-18 MED ORDER — SUGAMMADEX SODIUM 200 MG/2ML IV SOLN
INTRAVENOUS | Status: DC | PRN
  Administered 2024-09-18: 200 mg via INTRAVENOUS

## 2024-09-18 MED ORDER — ROCURONIUM BROMIDE 10 MG/ML (PF) SYRINGE
PREFILLED_SYRINGE | INTRAVENOUS | Status: AC
  Filled 2024-09-18: qty 10

## 2024-09-18 MED ORDER — POTASSIUM CHLORIDE IN NACL 20-0.9 MEQ/L-% IV SOLN
INTRAVENOUS | Status: DC
  Filled 2024-09-18: qty 1000

## 2024-09-18 MED ORDER — ONDANSETRON HCL 4 MG PO TABS: 4.0000 mg | ORAL_TABLET | Freq: Four times a day (QID) | ORAL | Status: DC | PRN

## 2024-09-18 MED ORDER — PROPOFOL 10 MG/ML IV BOLUS
INTRAVENOUS | Status: DC | PRN
  Administered 2024-09-18 (×3): 50 mg via INTRAVENOUS

## 2024-09-18 MED ORDER — PHENYLEPHRINE 80 MCG/ML (10ML) SYRINGE FOR IV PUSH (FOR BLOOD PRESSURE SUPPORT)
PREFILLED_SYRINGE | INTRAVENOUS | Status: DC | PRN
  Administered 2024-09-18 (×3): 80 ug via INTRAVENOUS
  Administered 2024-09-18: 160 ug via INTRAVENOUS

## 2024-09-18 MED ORDER — LACTATED RINGERS IV SOLN: INTRAVENOUS | Status: DC

## 2024-09-18 MED ORDER — CEFAZOLIN SODIUM-DEXTROSE 2-4 GM/100ML-% IV SOLN
2.0000 g | Freq: Three times a day (TID) | INTRAVENOUS | Status: AC
  Administered 2024-09-18 – 2024-09-19 (×2): 2 g via INTRAVENOUS
  Filled 2024-09-18 (×2): qty 100

## 2024-09-18 MED ORDER — ROCURONIUM BROMIDE 10 MG/ML (PF) SYRINGE
PREFILLED_SYRINGE | INTRAVENOUS | Status: DC | PRN
  Administered 2024-09-18: 70 mg via INTRAVENOUS
  Administered 2024-09-18: 30 mg via INTRAVENOUS

## 2024-09-18 MED ORDER — CHLORHEXIDINE GLUCONATE 0.12 % MT SOLN
15.0000 mL | Freq: Once | OROMUCOSAL | Status: AC
  Administered 2024-09-18: 15 mL via OROMUCOSAL
  Filled 2024-09-18: qty 15

## 2024-09-18 MED ORDER — CEFAZOLIN SODIUM-DEXTROSE 2-4 GM/100ML-% IV SOLN
2.0000 g | INTRAVENOUS | Status: AC
  Administered 2024-09-18: 2 g via INTRAVENOUS
  Filled 2024-09-18: qty 100

## 2024-09-18 MED ORDER — HYDROMORPHONE HCL 1 MG/ML IJ SOLN
0.2500 mg | INTRAMUSCULAR | Status: DC | PRN
  Administered 2024-09-18 (×2): 0.5 mg via INTRAVENOUS

## 2024-09-18 MED ORDER — THROMBIN 5000 UNITS EX SOLR
OROMUCOSAL | Status: DC | PRN
  Administered 2024-09-18: 5 mL via TOPICAL

## 2024-09-18 MED ORDER — MORPHINE SULFATE (PF) 2 MG/ML IV SOLN
2.0000 mg | INTRAVENOUS | Status: DC | PRN
  Administered 2024-09-18 (×2): 2 mg via INTRAVENOUS
  Filled 2024-09-18 (×2): qty 1

## 2024-09-18 NOTE — Transfer of Care (Signed)
 Immediate Anesthesia Transfer of Care Note  Patient: Carmen Brown  Procedure(s) Performed: POSTERIOR LATERAL  INTERBODY FUSION LUMBAR FOUR-LUMBAR FIVE LEFT LAMINECTOMY LUMBAR TWO-THREE,LEFT (Back)  Patient Location: PACU  Anesthesia Type:General  Level of Consciousness: drowsy and patient cooperative  Airway & Oxygen Therapy: Patient Spontanous Breathing and Patient connected to face mask oxygen  Post-op Assessment: Report given to RN and Post -op Vital signs reviewed and stable  Post vital signs: Reviewed and stable  Last Vitals:  Vitals Value Taken Time  BP 133/75 09/18/24 16:49  Temp 36.4 C 09/18/24 16:49  Pulse 71 09/18/24 16:52  Resp 16 09/18/24 16:52  SpO2 93 % 09/18/24 16:52  Vitals shown include unfiled device data.  Last Pain:  Vitals:   09/18/24 1123  TempSrc: Oral  PainSc: 2       Patients Stated Pain Goal: 0 (09/18/24 1123)  Complications: No notable events documented.

## 2024-09-18 NOTE — Anesthesia Procedure Notes (Addendum)
 Procedure Name: Intubation Date/Time: 09/18/2024 2:08 PM  Performed by: Genny Gun, CRNAPre-anesthesia Checklist: Patient identified, Emergency Drugs available, Suction available, Patient being monitored and Timeout performed Patient Re-evaluated:Patient Re-evaluated prior to induction Oxygen Delivery Method: Circle system utilized Preoxygenation: Pre-oxygenation with 100% oxygen Induction Type: IV induction Ventilation: Mask ventilation without difficulty and Oral airway inserted - appropriate to patient size Laryngoscope Size: Glidescope and 3 Grade View: Grade I Tube type: Oral Tube size: 7.0 mm Number of attempts: 1 Placement Confirmation: ETT inserted through vocal cords under direct vision, positive ETCO2 and breath sounds checked- equal and bilateral Secured at: 23 cm Tube secured with: Tape Dental Injury: Teeth and Oropharynx as per pre-operative assessment

## 2024-09-18 NOTE — Op Note (Signed)
 09/18/2024  4:34 PM  PATIENT:  Carmen Brown  84 y.o. female  PRE-OPERATIVE DIAGNOSIS:  1.  Spondylolisthesis L4-5 with spinal stenosis, back pain and left leg pain, 2.  Lumbar spinal stenosis L2-3 with left leg pain  POST-OPERATIVE DIAGNOSIS:  same  PROCEDURE:   1. Decompressive lumbar laminectomy, hemi facetectomy and foraminotomies L4-5 requiring more work than would be required for a simple exposure of the disk for PLIF in order to adequately decompress the neural elements and address the spinal stenosis 2. Posterior lumbar interbody fusion L4-5 using PTI interbody cages packed with morcellized allograft and autograft  3. Posterior fixation L4-5 using ATEC cortical pedicle screws.  4. Intertransverse arthrodesis L4-5 using morcellized autograft and allograft. 5.  Decompressive lumbar hemilaminectomy medial facetectomy foraminotomies L2-3 on the left for lumbar spinal stenosis, note this level is separate from the PLIF level and done through a separate incision.  SURGEON:  Alm Molt, MD  ASSISTANTS: Suzen Pean, FNP  ANESTHESIA:  General  EBL: 250 ml  Total I/O In: 1350 [I.V.:1000; IV Piggyback:350] Out: 350 [Urine:100; Blood:250]  BLOOD ADMINISTERED:none  DRAINS: none   INDICATION FOR PROCEDURE: This patient presented with back pain and left thigh pain to the knee. Imaging revealed lumbar spinal stenosis L2-3 with a grade 1 spondylolisthesis L4-5 with spinal stenosis. The patient tried a reasonable attempt at conservative medical measures without relief. I recommended decompression and instrumented fusion to address the stenosis as well as the segmental  instability.  Patient understood the risks, benefits, and alternatives and potential outcomes and wished to proceed.  PROCEDURE DETAILS:   The patient was taken to the operating room and after induction of adequate generalized endotracheal anesthesia, the patient was rolled into the prone position on chest rolls and all  pressure points were padded. The lumbar region was cleaned and then prepped with DuraPrep and draped in the usual sterile fashion.  We marked 2 incisions with AP fluoroscopy, 1 over L2-3 and 1 over L4-5.  We made separate incisions for each operation.  5 cc of local anesthesia was injected and then a dorsal midline incision was made over L2-3 and carried down to the lumbar fascia. The fascia was opened and the paraspinous musculature was taken down in a subperiosteal fashion to expose L2-3 on the left. Intraoperative fluoroscopy confirmed my level, and then I used a combination of the high-speed drill and the Kerrison punches to perform a hemilaminectomy, medial facetectomy, and foraminotomy at L2-3 on the left. The underlying yellow ligament was opened and removed in a piecemeal fashion to expose the underlying dura and exiting nerve root. I undercut the lateral recess and dissected down until I was medial to and distal to the pedicle. The nerve root was well decompressed. I then palpated with a coronary dilator along the nerve root and into the foramen to assure adequate decompression. I felt no more compression of the nerve root. I irrigated with saline solution . Achieved hemostasis with bipolar cautery, lined the dura with Gelfoam, and then closed the fascia with 0 Vicryl. I closed the subcutaneous tissues with 2-0 Vicryl and the subcuticular tissues with 3-0 Vicryl.   Then turned our attention to the lumbar interbody fusion at L4-5 through a separate incision.  Local anesthesia was injected and then a dorsal midline incision was made over L4-5 and carried down to the lumbosacral fascia. The fascia was opened and the paraspinous musculature was taken down in a subperiosteal fashion to expose L4-5. A self-retaining retractor was placed.  Intraoperative fluoroscopy confirmed my level, and I started with placement of the L4 cortical pedicle screws. The pedicle screw entry zones were identified utilizing surface  landmarks and  AP and lateral fluoroscopy. I scored the cortex with the high-speed drill and then used the hand drill to drill an upward and outward direction into the pedicle. I then tapped line to line. I then placed a 6.5 x 40 mm cortical pedicle screw into the pedicles of L4 bilaterally.    I then turned my attention to the decompression and complete lumbar laminectomies, hemi- facetectomies, and foraminotomies were performed at L4-5.  My nurse practitioner was directly involved in the decompression and exposure of the neural elements. the patient had significant spinal stenosis and this required more work than would be required for a simple exposure of the disc for posterior lumbar interbody fusion which would only require a limited laminotomy. Much more generous decompression and generous foraminotomy was undertaken in order to adequately decompress the neural elements and address the patient's leg pain. The yellow ligament was removed to expose the underlying dura and nerve roots, and generous foraminotomies were performed to adequately decompress the neural elements. Both the exiting and traversing nerve roots were decompressed on both sides until a coronary dilator passed easily along the nerve roots. Once the decompression was complete, I turned my attention to the posterior lower lumbar interbody fusion. The epidural venous vasculature was coagulated and cut sharply. Disc space was incised and the initial discectomy was performed with pituitary rongeurs. The disc space was distracted with sequential distractors to a height of 9 mm. We then used a series of scrapers and shavers to prepare the endplates for fusion. The midline was prepared with Epstein curettes. Once the complete discectomy was finished, we packed an appropriate sized interbody cage with local autograft and morcellized allograft, gently retracted the nerve root, and tapped the cage into position at L4-5.  The midline between the cages was  packed with morselized autograft and allograft.   We then turned our attention to the placement of the lower pedicle screws. The pedicle screw entry zones were identified utilizing surface landmarks and fluoroscopy. I drilled into each pedicle utilizing the hand drill, and tapped each pedicle with the appropriate tap. We palpated with a ball probe to assure no break in the cortex. We then placed 6.5 x 40 mm pedicle screws into the pedicles bilaterally at L5.  My nurse practitioner assisted in placement of the pedicle screws.  We then decorticated the transverse processes and laid a mixture of morcellized autograft and allograft out over these to perform intertransverse arthrodesis at L4-5. We then placed lordotic rods into the multiaxial screw heads of the pedicle screws and locked these in position with the locking caps and anti-torque device. We then checked our construct with AP and lateral fluoroscopy. Irrigated with copious amounts of 0.5% povidone iodine solution followed by saline solution. Inspected the nerve roots once again to assure adequate decompression, lined to the dura with Gelfoam,  and then we closed the muscle and the fascia with 0 Vicryl. Closed the subcutaneous tissues with 2-0 Vicryl and subcuticular tissues with 3-0 Vicryl. The skin incisions were closed with Dermabond, benzoin and Steri-Strips. Dressing was then applied, the patient was awakened from general anesthesia and transported to the recovery room in stable condition. At the end of the procedure all sponge, needle and instrument counts were correct.   PLAN OF CARE: admit to inpatient  PATIENT DISPOSITION:  PACU - hemodynamically  stable.   Delay start of Pharmacological VTE agent (>24hrs) due to surgical blood loss or risk of bleeding:  yes

## 2024-09-18 NOTE — H&P (Signed)
 Subjective: Patient is a 84 y.o. female admitted for back and left leg pain. Onset of symptoms was several months ago, gradually worsening since that time.  The pain is rated severe, and is located at the across the lower back and radiates to LLE. The pain is described as aching and burning and occurs all day. The symptoms have been progressive. Symptoms are exacerbated by exercise, standing, and walking for more than a few minutes. 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   Thyroid  disease     Past Surgical History:  Procedure Laterality Date   CARPAL TUNNEL RELEASE Right 11/2016   CATARACT EXTRACTION  2020   Per patient   LEFT HEART CATH AND CORONARY ANGIOGRAPHY N/A 09/08/2017   Procedure: LEFT HEART CATH AND CORONARY ANGIOGRAPHY;  Surgeon: Mady Bruckner, MD;  Location: [facility];  Service: Cardiovascular;  Laterality: N/A;   MASS EXCISION  03/15/2012   Procedure: MINOR EXCISION OF MASS;  Surgeon: Lamar LULLA Leonor Mickey., MD;  Location: [facility];  Service: Orthopedics;  Laterality: Right;  debride IP right long finger, excision mucoid cyst   TONSILLECTOMY     as a child, under age 89   ventricular tacticartia      Prior to Admission medications  Medication Sig Start Date End Date Taking? Authorizing Provider  amLODipine  (NORVASC ) 10 MG tablet TAKE 1 TABLET(10 MG) BY MOUTH DAILY 07/02/24  Yes Elnor Lauraine BRAVO, NP  aspirin  EC 81 MG tablet Take 1  tablet (81 mg total) by mouth daily. 07/23/21  Yes Lavona Agent, MD  atorvastatin  (LIPITOR ) 40 MG tablet Take 1 tablet (40 mg total) by mouth daily. 12/04/23  Yes Lavona Agent, MD  cyclobenzaprine  (FLEXERIL ) 5 MG tablet Take 1 tablet (5 mg total) by mouth 3 (three) times daily as needed for muscle spasms. 09/06/24  Yes Elnor Lauraine BRAVO, NP  lisinopril  (ZESTRIL ) 20 MG tablet TAKE 1 TABLET(20 MG) BY MOUTH TWICE DAILY 01/01/24  Yes Lavona Agent, MD  loratadine (CLARITIN) 10 MG tablet Take 10 mg by mouth daily.   Yes [provider]  metoprolol  succinate (TOPROL -XL) 25 MG 24 hr tablet TAKE 1 TABLET(25 MG) BY MOUTH DAILY 12/01/23  Yes Lavona Agent, MD  OVER THE COUNTER MEDICATION Take 2 capsules by mouth daily. Pain Away   Yes [provider]  oxyCODONE -acetaminophen  (PERCOCET) 5-325 MG tablet Take 1 tablet by mouth every 8 (eight) hours as needed. 09/16/24  Yes Elnor Lauraine BRAVO, NP  nitroGLYCERIN  (NITROSTAT ) 0.4 MG SL tablet Place 1 tablet (0.4 mg total) under the tongue every 5 (five) minutes x 3 doses as needed for chest pain. 09/09/17   Johnson Laymon HERO, PA-C   Allergies[1]  Social History   Tobacco Use   Smoking status: Never    Passive exposure: Never   Smokeless tobacco: Never  Substance Use Topics   Alcohol use: Yes    Comment: 1-2 drinks a month    Family History  Problem Relation Age of Onset   Heart disease Mother 76       MI   Heart disease Father 38       CABG   Cancer Sister        lyphoma and ovarian   CAD Sister 79       Stent   Lymphoma Sister    Diabetes Maternal Uncle    Stroke Maternal Grandmother    Diabetes Maternal Grandfather      Review of Systems  Positive ROS: neg  All other systems have been reviewed and were otherwise negative with the exception of those mentioned in the HPI and as above.  Objective: Vital signs in last 24 hours: Temp:  [97.7 F (36.5 C)] 97.7 F (36.5 C) (02/04 1123) Pulse Rate:  [89] 89 (02/04 1123) Resp:   [16] 16 (02/04 1123) BP: (135)/(84) 135/84 (02/04 1123) SpO2:  [95 %] 95 % (02/04 1123) Weight:  [103 kg] 103 kg (02/04 1123)  General Appearance: Alert, cooperative, no distress, appears stated age Head: Normocephalic, without obvious abnormality, atraumatic Eyes: PERRL, conjunctiva/corneas clear, EOM's intact    Neck: Supple, symmetrical, trachea midline Back: Symmetric, no curvature, ROM normal, no CVA tenderness Lungs:  respirations unlabored Heart: Regular rate and rhythm Abdomen: Soft, non-tender Extremities: Extremities normal, atraumatic, no cyanosis or edema Pulses: 2+ and symmetric all extremities Skin: Skin color, texture, turgor normal, no rashes or lesions  NEUROLOGIC:   Mental status: Alert and oriented x4,  no aphasia, good attention span, fund of knowledge, and memory Motor Exam - grossly normal Sensory Exam - grossly normal Reflexes: 1= Coordination - grossly normal Gait - grossly normal Balance - grossly normal Cranial Nerves: I: smell Not tested  II: visual acuity  OS: nl    OD: nl  II: visual fields Full to confrontation  II: pupils Equal, round, reactive to light  III,VII: ptosis None  III,IV,VI: extraocular muscles  Full ROM  V: mastication Normal  V: facial light touch sensation  Normal  V,VII: corneal reflex  Present  VII: facial muscle function - upper  Normal  VII: facial muscle function - lower Normal  VIII: hearing Not tested  IX: soft palate elevation  Normal  IX,X: gag reflex Present  XI: trapezius strength  5/5  XI: sternocleidomastoid strength 5/5  XI: neck flexion strength  5/5  XII: tongue strength  Normal    Data Review Lab Results  Component Value Date   WBC 11.9 (H) 09/13/2024   HGB 13.4 09/13/2024   HCT 39.4 09/13/2024   MCV 99.0 09/13/2024   PLT 288 09/13/2024   Lab Results  Component Value Date   NA 138 09/13/2024   K 4.6 09/13/2024   CL 103 09/13/2024   CO2 25 09/13/2024   BUN 28 (H) 09/13/2024   CREATININE 0.95  09/13/2024   GLUCOSE 114 (H) 09/13/2024   Lab Results  Component Value Date   INR 1.17 09/08/2017    Assessment/Plan:  Estimated body mass index is 38.96 kg/m as calculated from the following:   Height as of this encounter: 5' 4 (1.626 m).   Weight as of this encounter: 103 kg. Patient admitted for PLIF L4-5 and L DLL L2-3. Patient has failed a reasonable attempt at conservative therapy.  I explained the condition and procedure to the patient and answered any questions.  Patient wishes to  proceed with procedure as planned. Understands risks/ benefits and typical outcomes of procedure.   Alm GORMAN Molt 09/18/2024 1:23 PM      [1]  Allergies Allergen Reactions   Epinephrine Other (See Comments)    epinephrine epinephrine hydrochloride  Told by Dr    Latex     rash

## 2024-09-19 ENCOUNTER — Other Ambulatory Visit (HOSPITAL_COMMUNITY): Payer: Self-pay

## 2024-09-19 MED ORDER — CYCLOBENZAPRINE HCL 5 MG PO TABS
5.0000 mg | ORAL_TABLET | Freq: Three times a day (TID) | ORAL | 1 refills | Status: AC | PRN
  Filled 2024-09-19: qty 60, 20d supply, fill #0

## 2024-09-19 MED ORDER — OXYCODONE-ACETAMINOPHEN 5-325 MG PO TABS
1.0000 | ORAL_TABLET | ORAL | 0 refills | Status: AC | PRN
  Filled 2024-09-19: qty 40, 7d supply, fill #0

## 2024-09-19 NOTE — Evaluation (Signed)
 Physical Therapy Evaluation Patient Details Name: Carmen Brown MRN: [id number] DOB: Jun 26, 1941 Today's Date: 09/19/2024  History of Present Illness  Carmen Brown is a 84 y.o. 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 Pt s/p PLIF L4-5 and L DLL L2-3. PMHx: arthritis, CAD, cardiomyopathy, HTN, obesity, thyroid  disease, and pre-diabetes.   Clinical Impression  Pt admitted with above diagnosis. PTA, pt was modI for functional mobility using RW and independent with ADLs/IADLs. She lives alone in a one story house with a level entry from the garage. Pt demonstrated grossly symmetrical BLE strength. She performed bed mobility, transfers, and gait using RW with supervision for safety. Educated pt on back precautions and lumbar corset wear scheduled. Pt required cues to adhere to no bending, arching, or twisting during activity. She was able to recall 3/3 precautions at end of session. Pt feels ready and safe for discharge home. I have answered all her questions related to mobility.      If plan is discharge home, recommend the following: A little help with walking and/or transfers;A little help with bathing/dressing/bathroom;Assistance with cooking/housework;Assist for transportation;Help with stairs or ramp for entrance   Can travel by private vehicle        Equipment Recommendations BSC/3in1  Recommendations for Other Services       Functional Status Assessment Patient has not had a recent decline in their functional status     Precautions / Restrictions Precautions Precautions: Back;Fall Precaution Booklet Issued: Yes (comment) Recall of Precautions/Restrictions: Intact Precaution/Restrictions Comments: Educated pt on BLT - no bending (arching), lifting, or twisting. Educated on wear schedule for back brace and how to properly don/doff. Intermittent cues to adhere to back precautions with  mobility. Required Braces or Orthoses: Spinal Brace Spinal Brace: Lumbar corset;Applied in sitting position;Other (comment) Spinal Brace Comments: May remove when in bed; May ambulate to bathroom without brace; May Apply/Remove Brace While sitting; May remove brace to shower Restrictions Weight Bearing Restrictions Per Provider Order: No      Mobility  Bed Mobility Overal bed mobility: Needs Assistance Bed Mobility: Rolling, Sidelying to Sit Rolling: Supervision, Used rails Sidelying to sit: Supervision       General bed mobility comments: Educated pt on log roll technique. Cued sequencing. Pt sat up on R side of bed with increased time.    Transfers Overall transfer level: Needs assistance Equipment used: Rolling walker (2 wheels) Transfers: Sit to/from Stand Sit to Stand: Supervision           General transfer comment: Pt stood from lowest bed height and commode. Cued proper hand placement using RW. Powered up without physical assist. Supervision for safety.    Ambulation/Gait Ambulation/Gait assistance: Supervision Gait Distance (Feet): 150 Feet Assistive device: Rolling walker (2 wheels) Gait Pattern/deviations: Step-through pattern, Decreased stride length Gait velocity: decr Gait velocity interpretation: <1.8 ft/sec, indicate of risk for recurrent falls   General Gait Details: Pt ambulated with a reciprocal gait pattern, even weight shift, and good foot clearence. Cues for proximity to RW to maintain upright neutral posture. Pt navigated room, bathroom, and hallway well - no LOB.  Stairs Stairs: Yes Stairs assistance: Contact guard assist Stair Management: One rail Right, Forwards, Step to pattern Number of Stairs: 1 General stair comments: Pt ascended with RLE and descended with LLE.  Wheelchair Mobility     Tilt Bed    Modified Rankin (Stroke Patients Only)  Balance Overall balance assessment: Mild deficits observed, not formally tested                                            Pertinent Vitals/Pain Pain Assessment Pain Assessment: 0-10 Pain Score: 4  Pain Location: Back Pain Descriptors / Indicators: Operative site guarding, Discomfort, Sore Pain Intervention(s): Monitored during session, Limited activity within patient's tolerance, Repositioned    Home Living Family/patient expects to be discharged to:: Private residence Living Arrangements: Alone Available Help at Discharge: Friend(s);Family;Available PRN/intermittently Type of Home: House Home Access: Level entry (threshold from garage, able to step over using RW)       Home Layout: One level Home Equipment: Agricultural Consultant (2 wheels)      Prior Function Prior Level of Function : Independent/Modified Independent;Driving             Mobility Comments: ModI using RW. Denies fall hx. ADLs Comments: Indep with ADLs. Manages meds. Cooks light meals. Drives. Was previously working as a Pharmacist, Community Extremity Assessment Upper Extremity Assessment: Defer to OT evaluation    Lower Extremity Assessment Lower Extremity Assessment: Overall WFL for tasks assessed    Cervical / Trunk Assessment Cervical / Trunk Assessment: Back Surgery  Communication   Communication Communication: No apparent difficulties    Cognition Arousal: Alert Behavior During Therapy: WFL for tasks assessed/performed   PT - Cognitive impairments: No apparent impairments                       PT - Cognition Comments: Pt A,Ox4 Following commands: Intact       Cueing Cueing Techniques: Verbal cues     General Comments General comments (skin integrity, edema, etc.): Provided pt with back precautions handout. Cued donning of lumbar corset.    Exercises     Assessment/Plan    PT Assessment Patient does not need any further PT services  PT Problem List         PT Treatment Interventions      PT Goals  (Current goals can be found in the Care Plan section)  Acute Rehab PT Goals PT Goal Formulation: All assessment and education complete, DC therapy    Frequency       Co-evaluation               AM-PAC PT 6 Clicks Mobility  Outcome Measure Help needed turning from your back to your side while in a flat bed without using bedrails?: A Little Help needed moving from lying on your back to sitting on the side of a flat bed without using bedrails?: A Little Help needed moving to and from a bed to a chair (including a wheelchair)?: A Little Help needed standing up from a chair using your arms (e.g., wheelchair or bedside chair)?: A Little Help needed to walk in hospital room?: A Little Help needed climbing 3-5 steps with a railing? : A Little 6 Click Score: 18    End of Session Equipment Utilized During Treatment: Gait belt;Back brace Activity Tolerance: Patient tolerated treatment well Patient left: in bed;with call bell/phone within reach;with family/visitor present Nurse Communication: Mobility status PT Visit Diagnosis: Other abnormalities of gait and mobility (R26.89)    Time: 9255-9186 PT Time Calculation (min) (ACUTE ONLY): 29 min   Charges:   PT Evaluation $PT  Eval Low Complexity: 1 Low PT Treatments $Gait Training: 8-22 mins PT General Charges $$ ACUTE PT VISIT: 1 Visit         Randall SAUNDERS, PT, DPT Acute Rehabilitation Services Office: [phone] Secure Chat Preferred  Carmen Brown 09/19/2024, 9:12 AM

## 2024-09-19 NOTE — Plan of Care (Signed)
 Pt doing well. Pt and family given D/C instructions with verbal understanding. Rx's were sent to the pharmacy by MD. Pt's incision is clean and dry with no sign of infection. Pt's IV was removed prior to D/C. Pt D/C'd home via wheelchair per MD order. Pt received 3-n-1 from [employer] per MD order. Pt is stable @ D/C and has no other needs at this time. Rosina Rakers, RN

## 2024-09-19 NOTE — Discharge Summary (Signed)
 " Physician Discharge Summary  Patient ID: Carmen Brown MRN: [id number] DOB/AGE: Nov 22, 1940 84 y.o.  Admit date: 09/18/2024 Discharge date: 09/19/2024  Admission Diagnoses: lumbar stenosis with spondylolisthesis    Discharge Diagnoses: Same   Discharged Condition: Good  Hospital Course: The patient was admitted on 09/18/2024 and taken to the operating room where the patient underwent decompressive laminectomy L2-3 on the left and a posterior lumbar interbody fusion L4-5. The patient tolerated the procedure well and was taken to the recovery room and then to the floor in stable condition. The hospital course was routine. There were no complications. The wound remained clean dry and intact. Pt had appropriate back soreness. No complaints of leg pain or new N/T/W. The patient remained afebrile with stable vital signs, and tolerated a regular diet. The patient continued to increase activities, and pain was well controlled with oral pain medications.   Consults: None  Significant Diagnostic Studies:  Results for orders placed or performed during the hospital encounter of 09/18/24  ABO/Rh   Collection Time: 09/18/24 11:25 AM  Result Value Ref Range   ABO/RH(D)      O POS Performed at [facility], [address] [place], [place] [address]     DG Lumbar Spine 2-3 Views Result Date: 09/18/2024 CLINICAL DATA:  Intraoperative exam during lumbar fusion EXAM: DG LUMBAR SPINE 2-3V COMPARISON:  06/10/2024 FINDINGS: Two fluoroscopic images are obtained during the performance of the procedure and are provided for interpretation only. Images demonstrate discectomy and posterior fusion at the L4-5 level. Alignment is grossly anatomic. Please refer to operative report. Fluoroscopy time: 50 seconds, 42.67 mGy IMPRESSION: 1. Intraoperative evaluation as above. Please refer to the operative report. Electronically Signed   By: Ozell Daring M.D.   On: 09/18/2024 19:48   DG C-Arm 1-60 Min-No  Report Result Date: 09/18/2024 Fluoroscopy was utilized by the requesting physician.  No radiographic interpretation.   DG C-Arm 1-60 Min-No Report Result Date: 09/18/2024 Fluoroscopy was utilized by the requesting physician.  No radiographic interpretation.    Antibiotics:  Anti-infectives (From admission, onward)    Start     Dose/Rate Route Frequency Ordered Stop   09/18/24 2200  ceFAZolin  (ANCEF ) IVPB 2g/100 mL premix        2 g 200 mL/hr over 30 Minutes Intravenous Every 8 hours 09/18/24 1838 09/19/24 0536   09/18/24 1115  ceFAZolin  (ANCEF ) IVPB 2g/100 mL premix        2 g 200 mL/hr over 30 Minutes Intravenous On call to O.R. 09/18/24 1106 09/18/24 1455       Discharge Exam: Blood pressure 139/83, pulse (!) 101, temperature 98.8 F (37.1 C), temperature source Oral, resp. rate 18, height 5' 4 (1.626 m), weight 103 kg, SpO2 98%. Neurologic: Grossly normal Dressing dry  Discharge Medications:   Allergies as of 09/19/2024       Reactions   Epinephrine Other (See Comments)   epinephrine epinephrine hydrochloride Told by Dr    Latex    rash        Medication List     TAKE these medications    amLODipine  10 MG tablet Commonly known as: NORVASC  TAKE 1 TABLET(10 MG) BY MOUTH DAILY   aspirin  EC 81 MG tablet Take 1 tablet (81 mg total) by mouth daily.   atorvastatin  40 MG tablet Commonly known as: LIPITOR  Take 1 tablet (40 mg total) by mouth daily.   chlorhexidine  4 % external liquid Commonly known as: HIBICLENS  Apply 15 mLs (1  Application total) topically as directed for 30 doses. Use as directed daily for 5 days every other week for 6 weeks.   cyclobenzaprine  5 MG tablet Commonly known as: FLEXERIL  Take 1 tablet (5 mg total) by mouth 3 (three) times daily as needed for muscle spasms.   lisinopril  20 MG tablet Commonly known as: ZESTRIL  TAKE 1 TABLET(20 MG) BY MOUTH TWICE DAILY   loratadine 10 MG tablet Commonly known as: CLARITIN Take 10 mg by mouth  daily.   metoprolol  succinate 25 MG 24 hr tablet Commonly known as: TOPROL -XL TAKE 1 TABLET(25 MG) BY MOUTH DAILY   mupirocin  ointment 2 % Commonly known as: BACTROBAN  Place 1 Application into the nose 2 (two) times daily for 60 doses. Use as directed 2 times daily for 5 days every other week for 6 weeks.   nitroGLYCERIN  0.4 MG SL tablet Commonly known as: NITROSTAT  Place 1 tablet (0.4 mg total) under the tongue every 5 (five) minutes x 3 doses as needed for chest pain.   OVER THE COUNTER MEDICATION Take 2 capsules by mouth daily. Pain Away   oxyCODONE -acetaminophen  5-325 MG tablet Commonly known as: Percocet Take 1 tablet by mouth every 4 (four) hours as needed for moderate pain (pain score 4-6). What changed:  when to take this reasons to take this               Durable Medical Equipment  (From admission, onward)           Start     Ordered   09/18/24 1839  DME Walker rolling  Once       Question:  Patient needs a walker to treat with the following condition  Answer:  S/P lumbar fusion   09/18/24 1838   09/18/24 1839  DME 3 n 1  Once        09/18/24 1838            Disposition: home   Final Dx: PLIF L4-5, DLL L2-3 L  Discharge Instructions      Remove dressing in 72 hours   Complete by: As directed    Call MD for:  difficulty breathing, headache or visual disturbances   Complete by: As directed    Call MD for:  persistant nausea and vomiting   Complete by: As directed    Call MD for:  redness, tenderness, or signs of infection (pain, swelling, redness, odor or green/yellow discharge around incision site)   Complete by: As directed    Call MD for:  severe uncontrolled pain   Complete by: As directed    Call MD for:  temperature >100.4   Complete by: As directed    Increase activity slowly   Complete by: As directed           Signed: Alm GORMAN Molt 09/19/2024, 7:53 AM   "

## 2024-09-19 NOTE — Evaluation (Signed)
 Occupational Therapy Evaluation Patient Details Name: Carmen Brown MRN: [id number] DOB: 10-19-1940 Today's Date: 09/19/2024   History of Present Illness   Carmen Brown is a 84 y.o. 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 Pt s/p PLIF L4-5 and L DLL L2-3. PMHx: arthritis, CAD, cardiomyopathy, HTN, obesity, thyroid  disease, and pre-diabetes.     Clinical Impressions Patient admitted for the procedure above.  PTA, despite ongoing pain, patient remained Mod I with ADL, iADL and mobility.  Patient is close to baseline, needing only Min A for lower body ADL and Mod I for in room mobility/toileting.   No further OT needs in the acute setting, recommend follow up as prescribed by MD.      If plan is discharge home, recommend the following:   Assist for transportation;Assistance with cooking/housework     Functional Status Assessment   Patient has had a recent decline in their functional status and demonstrates the ability to make significant improvements in function in a reasonable and predictable amount of time.     Equipment Recommendations   None recommended by OT     Recommendations for Other Services         Precautions/Restrictions   Precautions Precautions: Back Precaution Booklet Issued: Yes (comment) Recall of Precautions/Restrictions: Intact Required Braces or Orthoses: Spinal Brace Spinal Brace: Lumbar corset Restrictions Weight Bearing Restrictions Per Provider Order: No     Mobility Bed Mobility Overal bed mobility: Modified Independent                  Transfers Overall transfer level: Modified independent Equipment used: Rolling walker (2 wheels) Transfers: Sit to/from Stand                    Balance Overall balance assessment: Mild deficits observed, not formally tested                                          ADL either performed or assessed with clinical judgement   ADL Overall ADL's : Modified independent                                             Vision Patient Visual Report: No change from baseline       Perception Perception: Not tested       Praxis Praxis: Not tested       Pertinent Vitals/Pain Pain Assessment Pain Assessment: Faces Faces Pain Scale: Hurts little more Pain Location: Back Pain Descriptors / Indicators: Operative site guarding, Discomfort, Sore Pain Intervention(s): Monitored during session     Extremity/Trunk Assessment Upper Extremity Assessment Upper Extremity Assessment: Overall WFL for tasks assessed   Lower Extremity Assessment Lower Extremity Assessment: Defer to PT evaluation   Cervical / Trunk Assessment Cervical / Trunk Assessment: Back Surgery   Communication Communication Communication: No apparent difficulties   Cognition Arousal: Alert Behavior During Therapy: WFL for tasks assessed/performed Cognition: No apparent impairments                               Following commands: Intact       Cueing  General Comments  Cueing Techniques: Verbal cues  Provided pt with back precautions handout. Cued donning of lumbar corset.   Exercises     Shoulder Instructions      Home Living Family/patient expects to be discharged to:: Private residence Living Arrangements: Alone Available Help at Discharge: Friend(s);Family;Available PRN/intermittently Type of Home: House Home Access: Level entry     Home Layout: One level     Bathroom Shower/Tub: Producer, Television/film/video: Standard     Home Equipment: Agricultural Consultant (2 wheels)          Prior Functioning/Environment Prior Level of Function : Independent/Modified Independent;Driving             Mobility Comments: ModI using RW. Denies fall hx. ADLs Comments: Indep with ADLs. Manages meds. Cooks light meals. Drives. Was  previously working as a Lawyer Problem List: Pain   OT Treatment/Interventions:        OT Goals(Current goals can be found in the care plan section)   Acute Rehab OT Goals Patient Stated Goal: Return home OT Goal Formulation: With patient Time For Goal Achievement: 09/20/24 Potential to Achieve Goals: Good   OT Frequency:       Co-evaluation              AM-PAC OT 6 Clicks Daily Activity     Outcome Measure Help from another person eating meals?: None Help from another person taking care of personal grooming?: None Help from another person toileting, which includes using toliet, bedpan, or urinal?: None Help from another person bathing (including washing, rinsing, drying)?: A Little Help from another person to put on and taking off regular upper body clothing?: None Help from another person to put on and taking off regular lower body clothing?: A Little 6 Click Score: 22   End of Session Equipment Utilized During Treatment: Back brace;Rolling walker (2 wheels) Nurse Communication: Mobility status  Activity Tolerance: Patient tolerated treatment well Patient left: in bed;with call bell/phone within reach;with family/visitor present  OT Visit Diagnosis: Pain                Time: 0813-0829 OT Time Calculation (min): 16 min Charges:  OT General Charges $OT Visit: 1 Visit OT Evaluation $OT Eval Moderate Complexity: 1 Mod  09/19/2024  RP, OTR/L  Acute Rehabilitation Services  Office:  [phone]   Carmen Brown 09/19/2024, 9:20 AM

## 2024-09-19 NOTE — Anesthesia Postprocedure Evaluation (Signed)
"   Anesthesia Post Note  Patient: Carmen Brown  Procedure(s) Performed: POSTERIOR LATERAL  INTERBODY FUSION LUMBAR FOUR-LUMBAR FIVE LEFT LAMINECTOMY LUMBAR TWO-THREE,LEFT (Back)     Patient location during evaluation: PACU Anesthesia Type: General Level of consciousness: awake Pain management: pain level controlled Vital Signs Assessment: post-procedure vital signs reviewed and stable Respiratory status: spontaneous breathing Cardiovascular status: stable Postop Assessment: no apparent nausea or vomiting Anesthetic complications: no   No notable events documented.                Lauraine DASEN Colhoun      "

## 2024-11-08 ENCOUNTER — Ambulatory Visit: Admitting: Internal Medicine

## 2024-11-21 ENCOUNTER — Ambulatory Visit: Admitting: Nurse Practitioner

## 2025-03-17 ENCOUNTER — Other Ambulatory Visit (HOSPITAL_COMMUNITY)
